# Patient Record
Sex: Male | Born: 1965 | Race: Black or African American | Hispanic: No | Marital: Single | Smoking: Never smoker
Health system: Southern US, Community
[De-identification: ages and names within clinical notes are randomized; demographics above are authoritative.]

## PROBLEM LIST (undated history)

## (undated) ENCOUNTER — Emergency Department (HOSPITAL_COMMUNITY): Discharge status: Absent without leave | Payer: 59

## (undated) DIAGNOSIS — E785 Hyperlipidemia, unspecified: Secondary | ICD-10-CM

## (undated) DIAGNOSIS — I10 Essential (primary) hypertension: Secondary | ICD-10-CM

## (undated) DIAGNOSIS — E119 Type 2 diabetes mellitus without complications: Secondary | ICD-10-CM

## (undated) DIAGNOSIS — G4733 Obstructive sleep apnea (adult) (pediatric): Secondary | ICD-10-CM

## (undated) DIAGNOSIS — G4700 Insomnia, unspecified: Secondary | ICD-10-CM

## (undated) DIAGNOSIS — K219 Gastro-esophageal reflux disease without esophagitis: Secondary | ICD-10-CM

## (undated) HISTORY — DX: Gastro-esophageal reflux disease without esophagitis: K21.9

## (undated) HISTORY — DX: Hyperlipidemia, unspecified: E78.5

## (undated) HISTORY — DX: Essential (primary) hypertension: I10

---

## 2008-12-01 ENCOUNTER — Emergency Department (HOSPITAL_COMMUNITY): Admission: EM | Admit: 2008-12-01 | Discharge: 2008-12-01 | Payer: Self-pay | Admitting: Emergency Medicine

## 2009-06-27 ENCOUNTER — Encounter (INDEPENDENT_AMBULATORY_CARE_PROVIDER_SITE_OTHER): Payer: Self-pay | Admitting: Family Medicine

## 2009-06-27 ENCOUNTER — Ambulatory Visit: Payer: Self-pay | Admitting: Internal Medicine

## 2009-06-27 LAB — CONVERTED CEMR LAB
Alkaline Phosphatase: 61 units/L (ref 39–117)
BUN: 8 mg/dL (ref 6–23)
Basophils Absolute: 0 10*3/uL (ref 0.0–0.1)
Basophils Relative: 0 % (ref 0–1)
Glucose, Bld: 173 mg/dL — ABNORMAL HIGH (ref 70–99)
HDL: 46 mg/dL (ref >39)
Hemoglobin: 13.3 g/dL (ref 13.0–17.0)
LDL Cholesterol: 100 mg/dL — ABNORMAL HIGH (ref 0–99)
MCHC: 32.8 g/dL (ref 30.0–36.0)
Monocytes Absolute: 0.4 10*3/uL (ref 0.1–1.0)
Neutro Abs: 3.6 10*3/uL (ref 1.7–7.7)
RDW: 12.1 % (ref 11.5–15.5)
Sodium: 141 meq/L (ref 135–145)
Total Bilirubin: 0.7 mg/dL (ref 0.3–1.2)
Total CHOL/HDL Ratio: 3.9
Total Protein: 7.3 g/dL (ref 6.0–8.3)
Triglycerides: 161 mg/dL — ABNORMAL HIGH (ref <150)
VLDL: 32 mg/dL (ref 0–40)

## 2009-07-03 ENCOUNTER — Ambulatory Visit: Payer: Self-pay | Admitting: Internal Medicine

## 2009-08-12 ENCOUNTER — Encounter (INDEPENDENT_AMBULATORY_CARE_PROVIDER_SITE_OTHER): Payer: Self-pay | Admitting: Family Medicine

## 2009-08-12 ENCOUNTER — Ambulatory Visit: Payer: Self-pay | Admitting: Internal Medicine

## 2009-08-12 LAB — CONVERTED CEMR LAB
BUN: 11 mg/dL (ref 6–23)
Calcium: 9.7 mg/dL (ref 8.4–10.5)
Creatinine, Ser: 0.91 mg/dL (ref 0.40–1.50)

## 2010-04-01 ENCOUNTER — Ambulatory Visit: Payer: Self-pay | Admitting: Internal Medicine

## 2010-04-01 DIAGNOSIS — K219 Gastro-esophageal reflux disease without esophagitis: Secondary | ICD-10-CM | POA: Insufficient documentation

## 2010-04-01 DIAGNOSIS — E785 Hyperlipidemia, unspecified: Secondary | ICD-10-CM

## 2010-04-01 DIAGNOSIS — I10 Essential (primary) hypertension: Secondary | ICD-10-CM | POA: Insufficient documentation

## 2010-04-01 DIAGNOSIS — E119 Type 2 diabetes mellitus without complications: Secondary | ICD-10-CM

## 2010-06-03 NOTE — Assessment & Plan Note (Signed)
Summary: new to estab/cbs   Vital Signs:  Patient profile:   45 year old male Height:      71 inches Weight:      279 pounds BMI:     39.05 Pulse rate:   110 / minute Pulse rhythm:   regular BP sitting:   138 / 96  (left arm) Cuff size:   regular  Vitals Entered By: Army Fossa CMA (Apr 30, 2010 2:45 PM) CC: New to establish, CPX- not fasting  Comments declines TD and Flu shot  [facility] [address]   History of Present Illness: CPX, new patient, here to establish needs RF  Preventive Screening-Counseling & Management  Alcohol-Tobacco     Smoking Status: never  Caffeine-Diet-Exercise     Does Patient Exercise: no      Drug Use:  no.    Current Medications (verified): 1)  Hydrochlorothiazide 25 Mg Tabs (Hydrochlorothiazide) .Marland Kitchen.. 1 By Mouth Once Daily 2)  Pravastatin Sodium 20 Mg Tabs (Pravastatin Sodium) .Marland Kitchen.. 1 By Mouth At Bedtime. 3)  Metformin Hcl 1000 Mg Tabs (Metformin Hcl) .Marland Kitchen.. 1 By Mouth Two Times A Day. 4)  Metoprolol Succinate 25 Mg Xr24h-Tab (Metoprolol Succinate) .Marland Kitchen.. 1 By Mouth At Bedtime 5)  Viagra 50 Mg Tabs (Sildenafil Citrate) .... As Needed  Allergies (verified): No Known Drug Allergies  Past History:  Family History: Last updated: 04-30-10 Heart Disease-- F , died at age 73  Stroke -- F  Diabetes-- F  Hypertension -- F colon ca--no prostate ca--no  Social History: Last updated: April 30, 2010 divorced 2 children  Never Smoked Alcohol use-yes, socially  Drug use-no Regular exercise- rarely, walks the dog Occupation: Location manager   Risk Factors: Exercise: no (04/30/10)  Risk Factors: Smoking Status: never (04-30-10)  Past Medical History: DM  ~ 2005 mild GERD , uses OTCs Hyperlipidemia Hypertension  Past Surgical History: no major  Family History: Heart Disease-- F , died at age 26  Stroke -- F  Diabetes-- F  Hypertension -- F colon ca--no prostate ca--no  Social History: divorced 2 children    Never Smoked Alcohol use-yes, socially  Drug use-no Regular exercise- rarely, walks the dog Occupation: Location manager  Smoking Status:  never Drug Use:  no Does Patient Exercise:  no Occupation:  employed  Review of Systems CV:  Denies chest pain or discomfort, palpitations, and swelling of feet; no ambulatory BPs  . Resp:  Denies cough and shortness of breath. GI:  Denies bloody stools, nausea, and vomiting. Endo:  no ambulatory CBGs .  Physical Exam  General:  alert, well-developed, and overweight-appearing.   Nose:  slightly congested Lungs:  normal respiratory effort, no intercostal retractions, no accessory muscle use, and normal breath sounds.   Heart:  normal rate, regular rhythm, no murmur, and no gallop.   Abdomen:  soft, non-tender, no distention, no masses, no guarding, and no rigidity.   Extremities:  no pretibial edema bilaterally  Psych:  Oriented X3, memory intact for recent and remote, normally interactive, good eye contact, not anxious appearing, and not depressed appearing.     Impression & Recommendations:  Problem # 1:  ROUTINE GENERAL MEDICAL EXAM@HEALTH  CARE FACL (ICD-V70.0) tetanus shot today Reluctant to take a flu shot. He has a mild  cold. I recommend him to come back in 2 weeks once he is better  extensive discussion about diet and exercise We'll start colon and  prostate cancer screening next year  Problem # 2:  HYPERTENSION (ICD-401.9) RF at goal?  see instructions  His updated medication list for this problem includes:    Hydrochlorothiazide 25 Mg Tabs (Hydrochlorothiazide) .Marland Kitchen... 1 by mouth once daily    Metoprolol Succinate 25 Mg Xr24h-tab (Metoprolol succinate) .Marland Kitchen... 1 by mouth at bedtime  BP today: 138/96  Labs Reviewed: K+: 4.5 (08/12/2009) Creat: : 0.91 (08/12/2009)   Chol: 178 (06/27/2009)   HDL: 46 (06/27/2009)   LDL: 100 (06/27/2009)   TG: 161 (06/27/2009)  Problem # 3:  HYPERLIPIDEMIA (ICD-272.4) labs, RF His  updated medication list for this problem includes:    Pravastatin Sodium 20 Mg Tabs (Pravastatin sodium) .Marland Kitchen... 1 by mouth at bedtime.  Labs Reviewed: SGOT: 16 (06/27/2009)   SGPT: 31 (06/27/2009)   HDL:46 (06/27/2009)  LDL:100 (06/27/2009)  Chol:178 (06/27/2009)  Trig:161 (06/27/2009)  Problem # 4:  DIABETES MELLITUS, TYPE II (ICD-250.00) labs, RF peripheral blood pressures, he has never talked to one. Written material provided diet, diabetes and exercise His updated medication list for this problem includes:    Metformin Hcl 1000 Mg Tabs (Metformin hcl) .Marland Kitchen... 1 by mouth two times a day.  Labs Reviewed: Creat: 0.91 (08/12/2009)     Complete Medication List: 1)  Hydrochlorothiazide 25 Mg Tabs (Hydrochlorothiazide) .Marland Kitchen.. 1 by mouth once daily 2)  Pravastatin Sodium 20 Mg Tabs (Pravastatin sodium) .Marland Kitchen.. 1 by mouth at bedtime. 3)  Metformin Hcl 1000 Mg Tabs (Metformin hcl) .Marland Kitchen.. 1 by mouth two times a day. 4)  Metoprolol Succinate 25 Mg Xr24h-tab (Metoprolol succinate) .Marland Kitchen.. 1 by mouth at bedtime 5)  Viagra 50 Mg Tabs (Sildenafil citrate) .... As needed, once a day  Other Orders: Tdap => 40yrs IM (19147) Admin 1st Vaccine (82956)  Patient Instructions: 1)  please get the records form your previous doctor  2)  come back in 2 weeks for a flu shot 3)  Check your blood pressure 2 or 3 times a week. If it is more than 140/85 consistently,please let us know  4)  Please come back fasting  within one week for blood work: 5)  FLP, AST, ALT--- dx  high cholesterol 6)  Hemoglobin A1c, microalbumin---- dx  diabetes 7)  CBC, BMP, TSH---dx v70 8)  Please schedule a follow-up appointment in 3 months .  Prescriptions: VIAGRA 50 MG TABS (SILDENAFIL CITRATE) as needed, once a day  #10 x 3   Entered and Authorized by:   Elita Quick E. Paz MD   Signed by:   Nolon Rod. Paz MD on 04/01/2010   Method used:   Print then Give to Patient   RxID:   [id number] METOPROLOL SUCCINATE 25 MG XR24H-TAB (METOPROLOL  SUCCINATE) 1 by mouth at bedtime  #90 x 1   Entered and Authorized by:   Elita Quick E. Paz MD   Signed by:   Nolon Rod. Paz MD on 04/01/2010   Method used:   Print then Give to Patient   RxID:   [id number] METFORMIN HCL 1000 MG TABS (METFORMIN HCL) 1 by mouth two times a day.  #180 x 1   Entered and Authorized by:   Nolon Rod. Paz MD   Signed by:   Nolon Rod. Paz MD on 04/01/2010   Method used:   Print then Give to Patient   RxID:   [id number] PRAVASTATIN SODIUM 20 MG TABS (PRAVASTATIN SODIUM) 1 by mouth at bedtime.  #90 x 1   Entered and Authorized by:   Nolon Rod. Paz MD   Signed by:   Nolon Rod. Paz MD on 04/01/2010   Method used:  Print then Give to Patient   RxID:   [id number] HYDROCHLOROTHIAZIDE 25 MG TABS (HYDROCHLOROTHIAZIDE) 1 by mouth once daily  #90 x 1   Entered and Authorized by:   Elita Quick E. Paz MD   Signed by:   Nolon Rod. Paz MD on 04/01/2010   Method used:   Print then Give to Patient   RxID:   [id number]    Orders Added: 1)  Tdap => 26yrs IM [90715] 2)  Admin 1st Vaccine [90471] 3)  New Patient 40-64 years [99386]   Immunizations Administered:  Tetanus Vaccine:    Vaccine Type: Tdap    Site: right deltoid    Mfr: GlaxoSmithKline    Dose: 0.5 ml    Route: IM    Given by: Army Fossa CMA    Exp. Date: 02/21/2012    Lot #: [id number]   Immunizations Administered:  Tetanus Vaccine:    Vaccine Type: Tdap    Site: right deltoid    Mfr: GlaxoSmithKline    Dose: 0.5 ml    Route: IM    Given by: Army Fossa CMA    Exp. Date: 02/21/2012    Lot #: [id number]   Risk Factors:  Tobacco use:  never Drug use:  no Alcohol use:  yes Exercise:  no

## 2010-07-02 ENCOUNTER — Ambulatory Visit: Payer: Self-pay | Admitting: Internal Medicine

## 2010-07-02 ENCOUNTER — Encounter (INDEPENDENT_AMBULATORY_CARE_PROVIDER_SITE_OTHER): Payer: Self-pay | Admitting: *Deleted

## 2010-07-02 ENCOUNTER — Other Ambulatory Visit (INDEPENDENT_AMBULATORY_CARE_PROVIDER_SITE_OTHER): Payer: 59

## 2010-07-02 ENCOUNTER — Other Ambulatory Visit: Payer: Self-pay | Admitting: Internal Medicine

## 2010-07-02 DIAGNOSIS — E119 Type 2 diabetes mellitus without complications: Secondary | ICD-10-CM

## 2010-07-02 DIAGNOSIS — E785 Hyperlipidemia, unspecified: Secondary | ICD-10-CM

## 2010-07-02 LAB — BASIC METABOLIC PANEL
CO2: 27 mEq/L (ref 19–32)
GFR: 122.35 mL/min (ref >60.00)
Glucose, Bld: 173 mg/dL — ABNORMAL HIGH (ref 70–99)
Potassium: 4 mEq/L (ref 3.5–5.1)
Sodium: 138 mEq/L (ref 135–145)

## 2010-07-02 LAB — MICROALBUMIN / CREATININE URINE RATIO
Microalb Creat Ratio: 1.7 mg/g (ref 0.0–30.0)
Microalb, Ur: 5.6 mg/dL — ABNORMAL HIGH (ref 0.0–1.9)

## 2010-07-02 LAB — CBC WITH DIFFERENTIAL/PLATELET
Basophils Absolute: 0 10*3/uL (ref 0.0–0.1)
Eosinophils Relative: 3.2 % (ref 0.0–5.0)
Monocytes Absolute: 0.5 10*3/uL (ref 0.1–1.0)
Monocytes Relative: 8.1 % (ref 3.0–12.0)
Neutrophils Relative %: 53.3 % (ref 43.0–77.0)
Platelets: 208 10*3/uL (ref 150.0–400.0)
RDW: 12.2 % (ref 11.5–14.6)
WBC: 6.1 10*3/uL (ref 4.5–10.5)

## 2010-07-02 LAB — LIPID PANEL
Cholesterol: 124 mg/dL (ref 0–200)
Triglycerides: 148 mg/dL (ref 0.0–149.0)

## 2010-07-02 LAB — HEMOGLOBIN A1C: Hgb A1c MFr Bld: 7.5 % — ABNORMAL HIGH (ref 4.6–6.5)

## 2010-11-10 ENCOUNTER — Telehealth: Payer: Self-pay | Admitting: Internal Medicine

## 2010-11-10 MED ORDER — GLUCOSE BLOOD VI STRP: ORAL_STRIP | Status: AC

## 2010-11-10 NOTE — Telephone Encounter (Signed)
Faxed over strips.  Pt is due for a follow up appt.

## 2010-11-13 NOTE — Telephone Encounter (Signed)
Patient has followup appt for 7/24 at 11:15 for Dr Drue Novel

## 2010-11-20 ENCOUNTER — Encounter: Payer: Self-pay | Admitting: Internal Medicine

## 2010-11-25 ENCOUNTER — Ambulatory Visit (INDEPENDENT_AMBULATORY_CARE_PROVIDER_SITE_OTHER): Payer: 59 | Admitting: Internal Medicine

## 2010-11-25 DIAGNOSIS — E119 Type 2 diabetes mellitus without complications: Secondary | ICD-10-CM

## 2010-11-25 DIAGNOSIS — G4700 Insomnia, unspecified: Secondary | ICD-10-CM | POA: Insufficient documentation

## 2010-11-25 LAB — MICROALBUMIN / CREATININE URINE RATIO: Microalb Creat Ratio: 0.7 mg/g (ref 0.0–30.0)

## 2010-11-25 MED ORDER — ZOLPIDEM TARTRATE 10 MG PO TABS: 10.0000 mg | ORAL_TABLET | Freq: Every evening | ORAL | Status: DC | PRN

## 2010-11-25 NOTE — Assessment & Plan Note (Signed)
Long history of insomnia, no anxiety-depression. Trial with Ambien

## 2010-11-25 NOTE — Assessment & Plan Note (Addendum)
Discussed importance to follow a healthy diet and exercise; also discussed the meaning of hemoglobin A1c. He has not been able to get januvia x 1 month plan: Refill Januvia, labs, if not at goal may be due to the lack of compliance with Januvia. Reasses in 4 months

## 2010-11-25 NOTE — Progress Notes (Signed)
  Subjective:    Patient ID: Bruce Henson, male    DOB: December 01, 1965, 45 y.o.   MRN: [id number]  HPI Routine office visit, feels well  Past Medical History  Diagnosis Date  . Diabetes mellitus 2005  . GERD (gastroesophageal reflux disease)     mild, uses OTCS  . Hyperlipidemia   . Hypertension    No past surgical history on file.   Review of Systems Tried Januvia, tolerated well except for maybe some diarrhea. He stopped taking Januvia for the last month because he couldn't afford it, he now has a new insurance and he will be able to get it. Denies nausea vomiting or blood in the stools Diet has not been the best lately, she's not exercising much Complaint of difficulty sleeping for long time, denies any anxiety depression. Has not ry any medication to help him sleep.    Objective:   Physical Exam  Constitutional: He is oriented to person, place, and time. He appears well-developed.       Obese appearing  Cardiovascular: Normal rate, regular rhythm and normal heart sounds.   No murmur heard. Pulmonary/Chest: Effort normal and breath sounds normal. No respiratory distress. He has no wheezes. He has no rales.  Musculoskeletal: He exhibits no edema.  Neurological: He is alert and oriented to person, place, and time.  Psychiatric: He has a normal mood and affect. His behavior is normal. Judgment and thought content normal.          Assessment & Plan:

## 2011-02-10 ENCOUNTER — Other Ambulatory Visit: Payer: Self-pay | Admitting: Internal Medicine

## 2011-02-10 NOTE — Telephone Encounter (Signed)
Rx[s] Done. 

## 2011-02-16 ENCOUNTER — Ambulatory Visit: Payer: 59 | Admitting: Internal Medicine

## 2011-04-13 ENCOUNTER — Other Ambulatory Visit: Payer: Self-pay | Admitting: Internal Medicine

## 2011-04-13 NOTE — Telephone Encounter (Signed)
Last filled #10 3, last ov 11-25-10

## 2011-05-01 NOTE — Telephone Encounter (Signed)
Ok #5 no further RF w/o OV

## 2011-06-04 ENCOUNTER — Other Ambulatory Visit: Payer: Self-pay | Admitting: Internal Medicine

## 2011-06-05 NOTE — Telephone Encounter (Signed)
Refill done.  

## 2011-06-12 ENCOUNTER — Other Ambulatory Visit: Payer: Self-pay | Admitting: Internal Medicine

## 2011-06-15 NOTE — Telephone Encounter (Signed)
Refill done.  

## 2011-06-19 ENCOUNTER — Other Ambulatory Visit: Payer: Self-pay | Admitting: Internal Medicine

## 2011-06-21 NOTE — Telephone Encounter (Signed)
Ok , #15 only, no RF. He is overdue for a OV

## 2011-06-22 ENCOUNTER — Other Ambulatory Visit: Payer: Self-pay | Admitting: Internal Medicine

## 2011-06-22 NOTE — Telephone Encounter (Signed)
Refill was sent in on 2.15.13. Notified pt.

## 2011-06-22 NOTE — Telephone Encounter (Signed)
Refill done.  

## 2011-06-26 ENCOUNTER — Ambulatory Visit (INDEPENDENT_AMBULATORY_CARE_PROVIDER_SITE_OTHER): Payer: Managed Care, Other (non HMO) | Admitting: Internal Medicine

## 2011-06-26 VITALS — BP 138/88 | HR 83 | Temp 98.3°F | Wt 283.0 lb

## 2011-06-26 DIAGNOSIS — E119 Type 2 diabetes mellitus without complications: Secondary | ICD-10-CM

## 2011-06-26 DIAGNOSIS — J069 Acute upper respiratory infection, unspecified: Secondary | ICD-10-CM | POA: Insufficient documentation

## 2011-06-26 MED ORDER — AMOXICILLIN 500 MG PO CAPS: 1000.0000 mg | ORAL_CAPSULE | Freq: Two times a day (BID) | ORAL | Status: AC

## 2011-06-26 NOTE — Assessment & Plan Note (Signed)
URI w/ nose bleed, see instructions

## 2011-06-26 NOTE — Assessment & Plan Note (Signed)
Labs , needs ROV

## 2011-06-26 NOTE — Patient Instructions (Signed)
Rest, fluids , tylenol For cough, take Mucinex DM twice a day as needed  For nose bleed and congestion: saline nasal spray, vaselin twice a day If not better in 4 days, then start the antibiotic as prescribed ----> Amoxicillin Call if no better in few days Call anytime if the symptoms are severe Please schedule a routine visit in 2 months

## 2011-06-26 NOTE — Progress Notes (Signed)
  Subjective:    Patient ID: Bruce Henson, male    DOB: 17-Oct-1965, 46 y.o.   MRN: [id number]  HPI Acute visit  Symptoms started a week ago with runny nose, sneezing, nasal discharge mostly clear but mixed with some blood. As far as his chronic medical problems, he is taking his medications as prescribed, not ambulatory CBGs  Past medical history Diabetes Hyperlipidemia Hypertension GERD, mild, uses over-the-counter medicines  Past surgical history No  Review of Systems Some subjective fever and chills No focal No chest congestion, very little cough.    Objective:   Physical Exam Alert, oriented x3. No apparent distress HEENT: Tympanic membranes normal, throat without redness, and also slightly congested. The right nostril, medial aspect, there is some evidence of mucosal bleeding. No active bleeding at this point. Lungs clear to auscultation bilaterally Cardiovascular regular rhythm without a murmur.       Assessment & Plan:

## 2011-06-28 ENCOUNTER — Encounter: Payer: Self-pay | Admitting: Internal Medicine

## 2011-06-29 ENCOUNTER — Ambulatory Visit: Payer: 59 | Admitting: Internal Medicine

## 2011-07-03 ENCOUNTER — Telehealth: Payer: Self-pay | Admitting: Internal Medicine

## 2011-07-03 MED ORDER — SITAGLIPTIN PHOSPHATE 100 MG PO TABS: 100.0000 mg | ORAL_TABLET | Freq: Every day | ORAL | Status: DC

## 2011-07-03 MED ORDER — METOPROLOL SUCCINATE ER 25 MG PO TB24: 25.0000 mg | ORAL_TABLET | Freq: Every day | ORAL | Status: DC

## 2011-07-03 MED ORDER — HYDROCHLOROTHIAZIDE 25 MG PO TABS: 25.0000 mg | ORAL_TABLET | Freq: Every day | ORAL | Status: DC

## 2011-07-03 MED ORDER — PRAVASTATIN SODIUM 20 MG PO TABS: 20.0000 mg | ORAL_TABLET | Freq: Every day | ORAL | Status: DC

## 2011-07-03 MED ORDER — METFORMIN HCL 1000 MG PO TABS: 1000.0000 mg | ORAL_TABLET | Freq: Two times a day (BID) | ORAL | Status: DC

## 2011-07-03 NOTE — Telephone Encounter (Signed)
Refill: Metformin hcl tabs 1000mg . Request 90 day supply.

## 2011-07-03 NOTE — Telephone Encounter (Signed)
Refill: Hydrochlorothiazide tab 25mg . Request 90 day supply.  Pravastatin tabs 20 mg. Request 90 day supply.  Januvia tabs 100mg . Request 90 day supply.  Metoprolol succinate er tabs 25mg . Request 90 day supply

## 2011-07-03 NOTE — Telephone Encounter (Signed)
Refill done.  

## 2011-07-07 ENCOUNTER — Telehealth: Payer: Self-pay | Admitting: *Deleted

## 2011-07-07 MED ORDER — HYDROCHLOROTHIAZIDE 25 MG PO TABS: 25.0000 mg | ORAL_TABLET | Freq: Every day | ORAL | Status: DC

## 2011-07-07 MED ORDER — METFORMIN HCL 1000 MG PO TABS: 1000.0000 mg | ORAL_TABLET | Freq: Two times a day (BID) | ORAL | Status: DC

## 2011-07-07 MED ORDER — PRAVASTATIN SODIUM 20 MG PO TABS: 20.0000 mg | ORAL_TABLET | Freq: Every day | ORAL | Status: DC

## 2011-07-07 MED ORDER — METOPROLOL SUCCINATE ER 25 MG PO TB24: 25.0000 mg | ORAL_TABLET | Freq: Every day | ORAL | Status: DC

## 2011-07-07 MED ORDER — SITAGLIPTIN PHOSPHATE 100 MG PO TABS: 100.0000 mg | ORAL_TABLET | Freq: Every day | ORAL | Status: DC

## 2011-07-07 NOTE — Telephone Encounter (Signed)
Pt states that [facility] has been trying to get in touch with [place] in reference to his Rx. Pt notes that they faxed [place] on 06-29-11 and have yet to get a response back from [place]. See telephone encounter 07-03-11 Rx sent to wrong pharmacy Rx resent to [facility].

## 2011-07-13 ENCOUNTER — Telehealth: Payer: Self-pay | Admitting: Internal Medicine

## 2011-07-13 MED ORDER — METFORMIN HCL 1000 MG PO TABS: 1000.0000 mg | ORAL_TABLET | Freq: Two times a day (BID) | ORAL | Status: DC

## 2011-07-13 NOTE — Telephone Encounter (Signed)
Spoke to pharmacy who indicated that they have received all other meds just need Rx for metformin clarify due to quantity. Rx called in to pharmacy.

## 2011-07-13 NOTE — Telephone Encounter (Signed)
Patients wife states that [facility] is not getting our faxes. Please call with ref# [id number] at [phone]   Can call patient back [phone] or Dondra Spry @ [phone]

## 2011-07-20 ENCOUNTER — Telehealth: Payer: Self-pay | Admitting: Internal Medicine

## 2011-07-20 MED ORDER — SILDENAFIL CITRATE 50 MG PO TABS: 50.0000 mg | ORAL_TABLET | ORAL | Status: DC | PRN

## 2011-07-20 NOTE — Telephone Encounter (Signed)
Patient would like prescription for Viagra sent to   [facility]  Phone# [phone]  [facility]ally [facility] is going to permanently switch to [facility] [phone]

## 2011-07-20 NOTE — Telephone Encounter (Signed)
Refill done.  

## 2011-08-28 ENCOUNTER — Other Ambulatory Visit: Payer: Self-pay | Admitting: *Deleted

## 2011-08-28 NOTE — Telephone Encounter (Signed)
Last OV 06-26-11, Last filled 07-20-11 #5

## 2011-09-04 NOTE — Telephone Encounter (Signed)
Denied needs OV 

## 2011-09-07 NOTE — Telephone Encounter (Signed)
Can you please call pt to schedule an OV.

## 2011-09-07 NOTE — Telephone Encounter (Signed)
Called patient left message to call office & schedule appt

## 2011-09-07 NOTE — Telephone Encounter (Signed)
Called patient Bruce Henson to call back ans schedule OV for medication renewal

## 2011-10-08 NOTE — Telephone Encounter (Signed)
Denied needs OV 

## 2012-01-15 ENCOUNTER — Encounter: Payer: Self-pay | Admitting: Internal Medicine

## 2012-01-15 ENCOUNTER — Ambulatory Visit (INDEPENDENT_AMBULATORY_CARE_PROVIDER_SITE_OTHER): Payer: Managed Care, Other (non HMO) | Admitting: Internal Medicine

## 2012-01-15 VITALS — BP 112/76 | HR 80 | Temp 98.6°F | Ht 73.0 in | Wt 279.0 lb

## 2012-01-15 DIAGNOSIS — E785 Hyperlipidemia, unspecified: Secondary | ICD-10-CM

## 2012-01-15 DIAGNOSIS — K219 Gastro-esophageal reflux disease without esophagitis: Secondary | ICD-10-CM

## 2012-01-15 DIAGNOSIS — I10 Essential (primary) hypertension: Secondary | ICD-10-CM

## 2012-01-15 DIAGNOSIS — Z0000 Encounter for general adult medical examination without abnormal findings: Secondary | ICD-10-CM

## 2012-01-15 DIAGNOSIS — E119 Type 2 diabetes mellitus without complications: Secondary | ICD-10-CM

## 2012-01-15 DIAGNOSIS — G4700 Insomnia, unspecified: Secondary | ICD-10-CM

## 2012-01-15 LAB — CBC WITH DIFFERENTIAL/PLATELET
Eosinophils Absolute: 0.2 10*3/uL (ref 0.0–0.7)
Eosinophils Relative: 3 % (ref 0–5)
HCT: 40.8 % (ref 39.0–52.0)
Hemoglobin: 13.8 g/dL (ref 13.0–17.0)
Lymphocytes Relative: 32 % (ref 12–46)
Lymphs Abs: 2.3 10*3/uL (ref 0.7–4.0)
MCH: 28.2 pg (ref 26.0–34.0)
MCV: 83.4 fL (ref 78.0–100.0)
Monocytes Relative: 7 % (ref 3–12)
Platelets: 237 10*3/uL (ref 150–400)
RBC: 4.89 MIL/uL (ref 4.22–5.81)
WBC: 7.2 10*3/uL (ref 4.0–10.5)

## 2012-01-15 MED ORDER — SILDENAFIL CITRATE 50 MG PO TABS: 50.0000 mg | ORAL_TABLET | ORAL | Status: DC | PRN

## 2012-01-15 MED ORDER — ZOLPIDEM TARTRATE 10 MG PO TABS: 10.0000 mg | ORAL_TABLET | Freq: Every evening | ORAL | Status: DC | PRN

## 2012-01-15 MED ORDER — RANITIDINE HCL 300 MG PO TABS: 300.0000 mg | ORAL_TABLET | Freq: Every day | ORAL | Status: DC

## 2012-01-15 NOTE — Assessment & Plan Note (Signed)
BP well-controlled today, labs

## 2012-01-15 NOTE — Progress Notes (Signed)
  Subjective:    Patient ID: Bruce Henson, male    DOB: [birth year], 46 y.o.   MRN: [id number]  HPI CPX  Past medical history Diabetes Hyperlipidemia Hypertension GERD, mild, uses over-the-counter medicines  Past surgical history No  Family History: Heart Disease-- F , died at age 62  Stroke -- F  Diabetes-- F  Hypertension -- F colon ca--no prostate ca--no  Social History: Divorced, remarried, 2 children  Never Smoked Alcohol use-- yes, socially  Drug use-no exercise- none, inactive  Diet-- poor  Occupation: Barista   Review of Systems  Respiratory: Negative for cough and shortness of breath.   Cardiovascular: Negative for chest pain and leg swelling.  Gastrointestinal: Negative for abdominal pain and blood in stool.       Occ heartburn (classic heartburn, no CP) at night   Genitourinary: Negative for dysuria and hematuria.  Psychiatric/Behavioral:       No depression or anxiety    Current Outpatient Rx  Name Route Sig Dispense Refill  . HYDROCHLOROTHIAZIDE 25 MG PO TABS Oral Take 1 tablet (25 mg total) by mouth daily. 90 tablet 3  . METFORMIN HCL 1000 MG PO TABS Oral Take 1 tablet (1,000 mg total) by mouth 2 (two) times daily with a meal. 180 tablet 3  . METOPROLOL SUCCINATE ER 25 MG PO TB24 Oral Take 1 tablet (25 mg total) by mouth daily. 90 tablet 3  . PRAVASTATIN SODIUM 20 MG PO TABS Oral Take 1 tablet (20 mg total) by mouth at bedtime. 90 tablet 3  . SITAGLIPTIN PHOSPHATE 100 MG PO TABS Oral Take 1 tablet (100 mg total) by mouth daily. 90 tablet 3  . ZOLPIDEM TARTRATE 10 MG PO TABS Oral Take 1 tablet (10 mg total) by mouth at bedtime as needed for sleep. 30 tablet 5  . RANITIDINE HCL 300 MG PO TABS Oral Take 1 tablet (300 mg total) by mouth at bedtime. 90 tablet 1  . SILDENAFIL CITRATE 50 MG PO TABS Oral Take 1 tablet (50 mg total) by mouth as needed for erectile dysfunction. 13 tablet 3  . ZOLPIDEM TARTRATE 10 MG PO TABS Oral Take 1 tablet (10  mg total) by mouth at bedtime as needed for sleep. 30 tablet 1        Objective:   Physical Exam  General -- alert, well-developed, and overweight appearing. No apparent distress.  Neck --no thyromegaly, normal carotid pulse Lungs -- normal respiratory effort, no intercostal retractions, no accessory muscle use, and normal breath sounds.   Heart-- normal rate, regular rhythm, no murmur, and no gallop.   Abdomen--soft, non-tender, no distention, no masses, no HSM, no guarding, and no rigidity.   Extremities-- no pretibial edema bilaterally  Neurologic-- alert & oriented X3 and strength normal in all extremities. Psych-- Cognition and judgment appear intact. Alert and cooperative with normal attention span and concentration.  not anxious appearing and not depressed appearing.       Assessment & Plan:

## 2012-01-15 NOTE — Assessment & Plan Note (Signed)
RF meds  

## 2012-01-15 NOTE — Assessment & Plan Note (Signed)
Reports good medication compliance, not ambulatory CBGs. Plan: Labs Strongly encouraged to come back fasting in 4 months for a checkup. I have not see him regularly for diabetes follow up. RF meds

## 2012-01-15 NOTE — Assessment & Plan Note (Signed)
Good medication compliance, he is nonfasting today and cannot come back another time fasting. Plan: Refill medicines, check the AST ALT. FLP on return to the office

## 2012-01-15 NOTE — Assessment & Plan Note (Signed)
Td 2011 I like to start a prostate ca screening q 2 years since he is AA, declined DRE thus will check a PSA  Flu shot -- will get at work discussed diet-exercise Also we managed chronic medical problems today

## 2012-01-15 NOTE — Addendum Note (Signed)
Addended by: Silvio Pate D on: 01/15/2012 06:22 PM   Modules accepted: Orders

## 2012-01-15 NOTE — Addendum Note (Signed)
Addended by: Silvio Pate D on: 01/15/2012 06:15 PM   Modules accepted: Orders

## 2012-01-15 NOTE — Assessment & Plan Note (Signed)
Nocturnal heartburn, information about healthy habits provided, Zantac 300 each bedtime.

## 2012-01-15 NOTE — Addendum Note (Signed)
Addended by: Silvio Pate D on: 01/15/2012 06:20 PM   Modules accepted: Orders

## 2012-01-16 LAB — COMPREHENSIVE METABOLIC PANEL
ALT: 23 U/L (ref 0–53)
AST: 14 U/L (ref 0–37)
Albumin: 4.8 g/dL (ref 3.5–5.2)
BUN: 10 mg/dL (ref 6–23)
CO2: 26 mEq/L (ref 19–32)
Calcium: 9.6 mg/dL (ref 8.4–10.5)
Chloride: 100 mEq/L (ref 96–112)
Potassium: 3.9 mEq/L (ref 3.5–5.3)

## 2012-01-16 LAB — TSH: TSH: 0.586 u[IU]/mL (ref 0.350–4.500)

## 2012-01-18 ENCOUNTER — Encounter: Payer: Self-pay | Admitting: *Deleted

## 2012-02-11 ENCOUNTER — Encounter (HOSPITAL_COMMUNITY): Payer: Self-pay | Admitting: *Deleted

## 2012-02-11 ENCOUNTER — Emergency Department (HOSPITAL_COMMUNITY)
Admission: EM | Admit: 2012-02-11 | Discharge: 2012-02-11 | Disposition: A | Discharge status: Home / Self Care | Payer: Managed Care, Other (non HMO) | Attending: Emergency Medicine | Admitting: Emergency Medicine

## 2012-02-11 DIAGNOSIS — E119 Type 2 diabetes mellitus without complications: Secondary | ICD-10-CM | POA: Insufficient documentation

## 2012-02-11 DIAGNOSIS — Z8042 Family history of malignant neoplasm of prostate: Secondary | ICD-10-CM | POA: Insufficient documentation

## 2012-02-11 DIAGNOSIS — K219 Gastro-esophageal reflux disease without esophagitis: Secondary | ICD-10-CM | POA: Insufficient documentation

## 2012-02-11 DIAGNOSIS — Z833 Family history of diabetes mellitus: Secondary | ICD-10-CM | POA: Insufficient documentation

## 2012-02-11 DIAGNOSIS — Z202 Contact with and (suspected) exposure to infections with a predominantly sexual mode of transmission: Secondary | ICD-10-CM | POA: Insufficient documentation

## 2012-02-11 DIAGNOSIS — I10 Essential (primary) hypertension: Secondary | ICD-10-CM | POA: Insufficient documentation

## 2012-02-11 DIAGNOSIS — Z823 Family history of stroke: Secondary | ICD-10-CM | POA: Insufficient documentation

## 2012-02-11 DIAGNOSIS — Z8249 Family history of ischemic heart disease and other diseases of the circulatory system: Secondary | ICD-10-CM | POA: Insufficient documentation

## 2012-02-11 DIAGNOSIS — Z800 Family history of malignant neoplasm of digestive organs: Secondary | ICD-10-CM | POA: Insufficient documentation

## 2012-02-11 MED ORDER — METRONIDAZOLE 500 MG PO TABS
2000.0000 mg | ORAL_TABLET | Freq: Once | ORAL | Status: AC
  Administered 2012-02-11: 2000 mg via ORAL
  Filled 2012-02-11: qty 4

## 2012-02-11 MED ORDER — AZITHROMYCIN 250 MG PO TABS
1000.0000 mg | ORAL_TABLET | Freq: Once | ORAL | Status: AC
  Administered 2012-02-11: 1000 mg via ORAL
  Filled 2012-02-11: qty 4

## 2012-02-11 MED ORDER — CEFTRIAXONE SODIUM 250 MG IJ SOLR
250.0000 mg | Freq: Once | INTRAMUSCULAR | Status: AC
  Administered 2012-02-11: 250 mg via INTRAMUSCULAR
  Filled 2012-02-11: qty 250

## 2012-02-11 MED ORDER — LIDOCAINE HCL 2 % IJ SOLN
INTRAMUSCULAR | Status: AC
  Filled 2012-02-11: qty 20

## 2012-02-11 NOTE — ED Provider Notes (Signed)
History     CSN: [id number]  Arrival date & time 02/11/12  1846   First MD Initiated Contact with Patient 02/11/12 2133      Chief Complaint  Patient presents with  . Exposure to STD    (Consider location/radiation/quality/duration/timing/severity/associated sxs/prior treatment) Patient is a 46 y.o. male presenting with STD exposure. The history is provided by the patient.  Exposure to STD This is a new problem. The current episode started in the past 7 days. The problem has been unchanged. Pertinent negatives include no abdominal pain, chills, fever, headaches, myalgias, nausea, rash, vomiting or weakness. Nothing aggravates the symptoms. He has tried nothing for the symptoms. The treatment provided no relief.  Pt states his girlfriend told him she tested positive for trichomonas. States no fever, no complaints. No urethral discharge, no pain with urination.   Past Medical History  Diagnosis Date  . Diabetes mellitus 2005  . GERD (gastroesophageal reflux disease)     mild, uses OTCS  . Hyperlipidemia   . Hypertension     History reviewed. No pertinent past surgical history.  Family History  Problem Relation Age of Onset  . Heart disease Father     died at age 29  . Stroke Father   . Diabetes Father   . Hypertension Father   . Colon cancer Neg Hx   . Prostate cancer Neg Hx     History  Substance Use Topics  . Smoking status: Never Smoker   . Smokeless tobacco: Not on file  . Alcohol Use: Yes     socially      Review of Systems  Constitutional: Negative for fever and chills.  Respiratory: Negative.   Cardiovascular: Negative.   Gastrointestinal: Negative for nausea, vomiting and abdominal pain.  Genitourinary: Negative for dysuria, urgency, frequency, hematuria, discharge, penile swelling, scrotal swelling, penile pain and testicular pain.  Musculoskeletal: Negative for myalgias and back pain.  Skin: Negative for rash.  Neurological: Negative for dizziness,  weakness and headaches.    Allergies  Review of patient's allergies indicates no known allergies.  Home Medications   Current Outpatient Rx  Name Route Sig Dispense Refill  . HYDROCHLOROTHIAZIDE 25 MG PO TABS Oral Take 25 mg by mouth daily.    Marland Kitchen METFORMIN HCL 1000 MG PO TABS Oral Take 1,000 mg by mouth 2 (two) times daily with a meal.    . METOPROLOL SUCCINATE ER 25 MG PO TB24 Oral Take 25 mg by mouth daily.    Marland Kitchen PRAVASTATIN SODIUM 20 MG PO TABS Oral Take 20 mg by mouth at bedtime.    Marland Kitchen RANITIDINE HCL 300 MG PO TABS Oral Take 300 mg by mouth at bedtime.    Marland Kitchen SILDENAFIL CITRATE 50 MG PO TABS Oral Take 50 mg by mouth as needed. E.D.    . SITAGLIPTIN PHOSPHATE 100 MG PO TABS Oral Take 100 mg by mouth daily.    Marland Kitchen ZOLPIDEM TARTRATE 10 MG PO TABS Oral Take 10 mg by mouth at bedtime as needed.    Marland Kitchen ZOLPIDEM TARTRATE 10 MG PO TABS Oral Take 1 tablet (10 mg total) by mouth at bedtime as needed for sleep. 30 tablet 1    BP 136/78  Pulse 85  Temp 98.6 F (37 C) (Oral)  Resp 18  SpO2 99%  Physical Exam  Nursing note and vitals reviewed. Constitutional: He appears well-developed and well-nourished. No distress.  Eyes: Conjunctivae normal are normal.  Neck: Neck supple.  Cardiovascular: Normal rate and normal heart sounds.  Pulmonary/Chest: Effort normal and breath sounds normal. No respiratory distress. He has no wheezes. He has no rales.  Abdominal: Soft. Bowel sounds are normal. He exhibits no distension. There is no tenderness.  Genitourinary: Prostate normal and penis normal.    ED Course  Procedures (including critical care time)  Pt with no urinary symptoms, no penile discharge, no scrotal pain. VS normal. Will treat with rocephin 250mg  IM, zithromax 1g PO, flagyl 2gPO. Follow up as needed    1. Exposure to STD       MDM          Lottie Mussel, PA 02/12/12 0125

## 2012-02-11 NOTE — ED Notes (Signed)
Pt states girlfriend diagnosed with trichomonas; pt has no symptoms

## 2012-02-11 NOTE — ED Notes (Signed)
Pt verbalizes understanding 

## 2012-02-13 NOTE — ED Provider Notes (Signed)
Medical screening examination/treatment/procedure(s) were performed by non-physician practitioner and as supervising physician I was immediately available for consultation/collaboration.    Tynan Boesel R Lenwood Balsam, MD 02/13/12 1650 

## 2012-02-26 ENCOUNTER — Ambulatory Visit (INDEPENDENT_AMBULATORY_CARE_PROVIDER_SITE_OTHER): Payer: Managed Care, Other (non HMO) | Admitting: Internal Medicine

## 2012-02-26 ENCOUNTER — Encounter: Payer: Self-pay | Admitting: Internal Medicine

## 2012-02-26 VITALS — BP 138/86 | HR 72 | Temp 98.1°F | Wt 284.0 lb

## 2012-02-26 DIAGNOSIS — Z113 Encounter for screening for infections with a predominantly sexual mode of transmission: Secondary | ICD-10-CM

## 2012-02-26 DIAGNOSIS — I10 Essential (primary) hypertension: Secondary | ICD-10-CM

## 2012-02-26 LAB — POCT URINALYSIS DIPSTICK
Bilirubin, UA: NEGATIVE
Leukocytes, UA: NEGATIVE
Nitrite, UA: NEGATIVE
Protein, UA: NEGATIVE
pH, UA: 6

## 2012-02-26 NOTE — Patient Instructions (Addendum)
Safer Sex  Your caregiver wants you to have this information about the infections that can be transmitted from sexual contact and how to prevent them. The idea behind safer sex is that you can be sexually active, and at the same time reduce the risk of giving or getting a sexually transmitted disease (STD). Every person should be aware of how to prevent him or herself and his or her sex partner from getting an STD.  CAUSES OF STDS  STDs are transmitted by sharing body fluids, which contain viruses and bacteria. The following fluids all transmit infections during sexual intercourse and sex acts:  · Semen.  · Saliva.  · Urine.  · Blood.  · Vaginal mucus.  Examples of STDs include:  · Chlamydia.  · Gonorrhea.  · Genital herpes.  · Hepatitis B.  · Human immunodeficiency virus or acquired immunodeficiency syndrome (HIV or AIDS).  · Syphilis.  · Trichomonas.  · Pubic lice.  · Human papillomavirus (HPV), which may include:  · Genital warts.  · Cervical dysplasia.  · Cervical cancer (can develop with certain types of HPV).  SYMPTOMS   Sexual diseases often cause few or no symptoms until they are advanced, so a person can be infected and spread the infection without knowing it. Some STDs respond to treatment very well. Others, like HIV and herpes, cannot be cured, but are treated to reduce their effects.  Specific symptoms include:  · Abnormal vaginal discharge.  · Irritation or itching in and around the vagina, and in the pubic hair.  · Pain during sexual intercourse.  · Bleeding during sexual intercourse.  · Pelvic or abdominal pain.  · Fever.  · Growths in and around the vagina.  · An ulcer in or around the vagina.  · Swollen glands in the groin area.  DIAGNOSIS   · Blood tests.  · Pap test.  · Culture test of abnormal vaginal discharge.  · A test that applies a solution and examines the cervix with a lighted magnifying scope (colposcopy).  · A test that examines the pelvis with a lighted tube, through a small incision  (laparoscopy).  TREATMENT   The treatment will depend on the cause of the STD.  · Antibiotic treatment by injection, oral, creams, or suppositories in the vagina.  · Over-the-counter medicated shampoo, to get rid of pubic lice.  · Removing or treating growths with medicine, freezing, burning (electrocautery), or surgery.  · Surgery treatment for HPV of the cervix.  · Supportive medicines for herpes, HIV, AIDS, and hepatitis.  Being careful cannot eliminate all risk of infection, but sex can be made much safer.  Safe sexual practices include body massage and gentle touching. Masturbation is safe, as long as body fluids do not contact skin that has sores or cuts. Dry kissing and oral sex on a man wearing a latex condom or on a woman wearing a male condom is also safe. Slightly less safe is intercourse while the man wears a latex condom or wet kissing. It is also safer to have one sex partner that you know is not having sex with anyone else.  LENGTH OF ILLNESS  An STD might be treated and cured in a week, sometimes a month, or more. And it can linger with symptoms for many years. STDs can also cause damage to the male organs. This can cause chronic pain, infertility, and recurrence of the STD, especially herpes, hepatitis, HIV, and HPV.  HOME CARE INSTRUCTIONS AND PREVENTION  · Alcohol   and recreational drugs are often the reason given for not practicing safer sex. These substances affect your judgment. Alcohol and recreational drugs can also impair your immune system, making you more vulnerable to disease.  · Do not engage in risky and dangerous sexual practices, including:  · Vaginal or anal sex without a condom.  · Oral sex on a man without a condom.  · Oral sex on a woman without a male condom.  · Using saliva to lubricate a condom.  · Any other sexual contact in which body fluids or blood from one partner contact the other partner.  · You should use only latex condoms for men and water soluble lubricants.  Petroleum based lubricants or oils used to lubricate a condom will weaken the condom and increase the chance that it will break.  · Think very carefully before having sex with anyone who is high risk for STDs and HIV. This includes IV drug users, people with multiple sexual partners, or people who have had an STD, or a positive hepatitis or HIV blood test.  · Remember that even if your partner has had only one previous partner, their previous partner might have had multiple partners. If so, you are at high risk of being exposed to an STD. You and your sex partner should be the only sex partners with each other, with no one else involved.  · A vaccine is available for hepatitis B and HPV through your caregiver or the Public Health Department. Everyone should be vaccinated with these vaccines.  · Avoid risky sex practices. Sex acts that can break the skin make you more likely to get an STD.  SEEK MEDICAL CARE IF:   · If you think you have an STD, even if you do not have any symptoms. Contact your caregiver for evaluation and treatment, if needed.  · You think or know your sex partner has acquired an STD.  · You have any of the symptoms mentioned above.  Document Released: 05/28/2004 Document Revised: 07/13/2011 Document Reviewed: 03/20/2009  ExitCare® Patient Information ©2013 [employer].

## 2012-02-26 NOTE — Progress Notes (Signed)
  Subjective:    Patient ID: Bruce Henson, male    DOB: 05-24-1965, 46 y.o.   MRN: [id number]  HPI Acute visit Went to the ER 2 weeks ago after exposure to Trichomonas, received Rocephin and Zithromax. He again had intercourse, the condom broke, likes to be sure he's okay.  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     Objective:   Physical Exam  Constitutional: He appears well-developed. No distress.  Genitourinary: Penis normal.       No penile d/c , rash, lesions. No inguinal LADs; scrotal contents normal  Skin: He is not diaphoretic.  Psychiatric: He has a normal mood and affect. His behavior is normal. Judgment and thought content normal.       Assessment & Plan:  STD screening  Safe sex!! Check for G&C, HIV RPR

## 2012-02-26 NOTE — Assessment & Plan Note (Signed)
Well controlled 

## 2012-02-27 LAB — RPR

## 2012-03-01 ENCOUNTER — Telehealth: Payer: Self-pay

## 2012-03-01 ENCOUNTER — Encounter: Payer: Self-pay | Admitting: *Deleted

## 2012-03-01 NOTE — Telephone Encounter (Signed)
Discussed with pt & gave urine results.

## 2012-03-01 NOTE — Telephone Encounter (Signed)
Pt called asking for lab results. I advised pt neg for RPR, HIV Chlamydia. Pt stated understanding and wanted to make sure there wasn't anymore test pending. PLz advise    MW

## 2012-03-11 ENCOUNTER — Ambulatory Visit: Payer: Managed Care, Other (non HMO) | Admitting: Internal Medicine

## 2012-09-27 ENCOUNTER — Other Ambulatory Visit: Payer: Self-pay | Admitting: Internal Medicine

## 2012-09-28 NOTE — Telephone Encounter (Signed)
Refill done.  

## 2012-10-11 ENCOUNTER — Encounter: Payer: Self-pay | Admitting: Lab

## 2012-10-12 ENCOUNTER — Ambulatory Visit (INDEPENDENT_AMBULATORY_CARE_PROVIDER_SITE_OTHER): Payer: Managed Care, Other (non HMO) | Admitting: Internal Medicine

## 2012-10-12 ENCOUNTER — Encounter: Payer: Self-pay | Admitting: Internal Medicine

## 2012-10-12 VITALS — BP 138/90 | HR 96 | Temp 98.5°F | Wt 279.0 lb

## 2012-10-12 DIAGNOSIS — E785 Hyperlipidemia, unspecified: Secondary | ICD-10-CM

## 2012-10-12 DIAGNOSIS — E119 Type 2 diabetes mellitus without complications: Secondary | ICD-10-CM

## 2012-10-12 DIAGNOSIS — Z0000 Encounter for general adult medical examination without abnormal findings: Secondary | ICD-10-CM

## 2012-10-12 DIAGNOSIS — I10 Essential (primary) hypertension: Secondary | ICD-10-CM

## 2012-10-12 DIAGNOSIS — G4700 Insomnia, unspecified: Secondary | ICD-10-CM

## 2012-10-12 LAB — MICROALBUMIN / CREATININE URINE RATIO
Creatinine,U: 306.4 mg/dL
Microalb Creat Ratio: 1.4 mg/g (ref 0.0–30.0)
Microalb, Ur: 4.2 mg/dL — ABNORMAL HIGH (ref 0.0–1.9)

## 2012-10-12 LAB — BASIC METABOLIC PANEL
BUN: 10 mg/dL (ref 6–23)
CO2: 25 mEq/L (ref 19–32)
Chloride: 103 mEq/L (ref 96–112)
Creatinine, Ser: 0.9 mg/dL (ref 0.4–1.5)
Glucose, Bld: 181 mg/dL — ABNORMAL HIGH (ref 70–99)

## 2012-10-12 LAB — LIPID PANEL: Total CHOL/HDL Ratio: 3

## 2012-10-12 LAB — HEMOGLOBIN A1C: Hgb A1c MFr Bld: 7.9 % — ABNORMAL HIGH (ref 4.6–6.5)

## 2012-10-12 MED ORDER — ZOLPIDEM TARTRATE 10 MG PO TABS: 10.0000 mg | ORAL_TABLET | Freq: Every evening | ORAL | Status: DC | PRN

## 2012-10-12 NOTE — Patient Instructions (Addendum)
Next visit in 4 months     Diabetes and Foot Care Diabetes may cause you to have a poor blood supply (circulation) to your legs and feet. Because of this, the skin may be thinner, break easier, and heal more slowly. You also may have nerve damage in your legs and feet causing decreased feeling. You may not notice minor injuries to your feet that could lead to serious problems or infections. Taking care of your feet is one of the most important things you can do for yourself.  HOME CARE INSTRUCTIONS  Do not go barefoot. Bare feet are easily injured.  Check your feet daily for blisters, cuts, and redness.  Wash your feet with warm water (not hot) and mild soap. Pat your feet and between your toes until completely dry.  Apply a moisturizing lotion that does not contain alcohol or petroleum jelly to the dry skin on your feet and to dry brittle toenails. Do not put it between your toes.  Trim your toenails straight across. Do not dig under them or around the cuticle.  Do not cut corns or calluses, or try to remove them with medicine.  Wear clean cotton socks or stockings every day. Make sure they are not too tight. Do not wear knee high stockings since they may decrease blood flow to your legs.  Wear leather shoes that fit properly and have enough cushioning. To break in new shoes, wear them just a few hours a day to avoid injuring your feet.  Wear shoes at all times, even in the house.  Do not cross your legs. This may decrease the blood flow to your feet.  If you find a minor scrape, cut, or break in the skin on your feet, keep it and the skin around it clean and dry. These areas may be cleansed with mild soap and water. Do not use peroxide, alcohol, iodine or Merthiolate.  When you remove an adhesive bandage, be sure not to harm the skin around it.  If you have a wound, look at it several times a day to make sure it is healing.  Do not use heating pads or hot water bottles. Burns can  occur. If you have lost feeling in your feet or legs, you may not know it is happening until it is too late.  Report any cuts, sores or bruises to your caregiver. Do not wait! SEEK MEDICAL CARE IF:   You have an injury that is not healing or you notice redness, numbness, burning, or tingling.  Your feet always feel cold.  You have pain or cramps in your legs and feet. SEEK IMMEDIATE MEDICAL CARE IF:   There is increasing redness, swelling, or increasing pain in the wound.  There is a red line that goes up your leg.  Pus is coming from a wound.  You develop an unexplained oral temperature above 102 F (38.9 C), or as your caregiver suggests.  You notice a bad smell coming from an ulcer or wound. MAKE SURE YOU:   Understand these instructions.  Will watch your condition.  Will get help right away if you are not doing well or get worse. Document Released: 04/17/2000 Document Revised: 07/13/2011 Document Reviewed: 10/24/2008 [facility] Patient Information 2014 [place], [place].

## 2012-10-12 NOTE — Assessment & Plan Note (Signed)
Good med compliance, not fasting today and won't be able to come back for labs: check FLP

## 2012-10-12 NOTE — Progress Notes (Signed)
  Subjective:    Patient ID: Bruce Henson, male    DOB: 1965-08-12, 47 y.o.   MRN: [id number]  HPI ROV Diabetes, good medication compliance, not ambulatory CBGs. Hypertension, good medication compliance, not ambulatory BPs Insomnia, Ambien works well, needs a refill. High cholesterol, taking Pravachol, not fasting today.  Past Medical History  Diagnosis Date  . Diabetes mellitus 2005  . GERD (gastroesophageal reflux disease)     mild, uses OTCS  . Hyperlipidemia   . Hypertension     No past surgical history on file.  History   Social History  . Marital Status: Single    Spouse Name: N/A    Number of Children: 2  . Years of Education: N/A   Occupational History  . Heavy Arboriculturist    Social History Main Topics  . Smoking status: Never Smoker   . Smokeless tobacco: Never Used  . Alcohol Use: Yes     Comment: socially  . Drug Use: No  . Sexually Active: Not on file   Other Topics Concern  . Not on file   Social History Narrative   Divorced, remarried, 2 children                 Review of Systems Denies chest pain, shortness of breath No nausea, vomiting, diarrhea. Occasional blurred vision, has not have his eyes checked lately. Denies burning of his feet, occasionally has numbness of the right great toe.    Objective:   Physical Exam BP 138/90  Pulse 96  Temp(Src) 98.5 F (36.9 C) (Oral)  Wt 279 lb (126.554 kg)  BMI 36.82 kg/m2  SpO2 96%  General -- alert, well-developed Lungs -- normal respiratory effort, no intercostal retractions, no accessory muscle use, and normal breath sounds.   Heart-- normal rate, regular rhythm, no murmur, and no gallop.   DIABETIC FEET EXAM: No lower extremity edema Normal pedal pulses bilaterally Skin and nails are normal without calluses Pinprick examination of the feet normal. Neurologic-- alert & oriented X3 and strength normal in all extremities. Psych-- Cognition and judgment appear intact. Alert and  cooperative with normal attention span and concentration.  not anxious appearing and not depressed appearing.       Assessment & Plan:

## 2012-10-12 NOTE — Assessment & Plan Note (Signed)
DBP slt elevated, recheck on RTC, consider adjust med Labs

## 2012-10-12 NOTE — Assessment & Plan Note (Signed)
Re check a HIV

## 2012-10-12 NOTE — Assessment & Plan Note (Addendum)
No ambulatory CBGs, will check A1c. Feet exam normal today, nevertheless feet care  was discussed. Refer to ophthalmology, has not seen one in a while.

## 2012-10-12 NOTE — Assessment & Plan Note (Signed)
RF meds  

## 2012-10-17 ENCOUNTER — Telehealth: Payer: Self-pay | Admitting: *Deleted

## 2012-10-17 MED ORDER — PIOGLITAZONE HCL 30 MG PO TABS: 30.0000 mg | ORAL_TABLET | Freq: Every day | ORAL | Status: DC

## 2012-10-17 NOTE — Telephone Encounter (Signed)
Discussed with pt, sent rx to pharmacy.  

## 2012-10-17 NOTE — Telephone Encounter (Signed)
Message copied by Nada Maclachlan on Mon Oct 17, 2012  4:16 PM ------      Message from: Willow Ora E      Created: Mon Oct 17, 2012  8:07 AM       Call patient      His diabetes is not well-controlled, other labs okay.      Definitely needs to improve his diet and exercise at least 3 hours weekly.      In addition I recommend Actos 30 mg one tablet daily. #30, 5 RF      Office visit in 3 months.Advise patient I strongly encouraged him to come back, otherwise I cannot help in get to goal. ------

## 2012-11-01 ENCOUNTER — Encounter: Payer: Self-pay | Admitting: Internal Medicine

## 2013-01-18 ENCOUNTER — Telehealth: Payer: Self-pay | Admitting: Internal Medicine

## 2013-01-18 NOTE — Telephone Encounter (Signed)
Spoke w/pt. He was not able to make appt at this time. Will call back to schedule follow-up appt.

## 2013-01-18 NOTE — Telephone Encounter (Signed)
Due for a ROV, lease schedule

## 2013-01-18 NOTE — Telephone Encounter (Signed)
thx

## 2013-02-03 ENCOUNTER — Other Ambulatory Visit: Payer: Self-pay | Admitting: Internal Medicine

## 2013-02-03 NOTE — Telephone Encounter (Signed)
Medication refills for viagra and zantac sent to pharmacy

## 2013-07-06 ENCOUNTER — Telehealth: Payer: Self-pay | Admitting: Internal Medicine

## 2013-07-06 MED ORDER — METFORMIN HCL 1000 MG PO TABS: 1000.0000 mg | ORAL_TABLET | Freq: Two times a day (BID) | ORAL | Status: DC

## 2013-07-06 MED ORDER — HYDROCHLOROTHIAZIDE 25 MG PO TABS: ORAL_TABLET | ORAL | Status: DC

## 2013-07-06 MED ORDER — PRAVASTATIN SODIUM 20 MG PO TABS: ORAL_TABLET | ORAL | Status: DC

## 2013-07-06 MED ORDER — METOPROLOL SUCCINATE ER 25 MG PO TB24: ORAL_TABLET | ORAL | Status: DC

## 2013-07-06 MED ORDER — SITAGLIPTIN PHOSPHATE 100 MG PO TABS: ORAL_TABLET | ORAL | Status: DC

## 2013-07-06 NOTE — Telephone Encounter (Signed)
Done. [facility] along with 2wk supply to [facility]. Pt notified.

## 2013-07-06 NOTE — Telephone Encounter (Signed)
Patient called and stated that his mail order [facility] ([facility] have sent requestes over to refill his Rx's  Metformin Pravastatin Metoprolol Januvia  hydrochlorothiazide (HYDRODIURIL) 25 MG tablet  Also Patient has ran out of these medications and would like for a 14 day supply be called into [facility] on [address] as well [facility].

## 2013-07-10 ENCOUNTER — Encounter: Payer: Self-pay | Admitting: Internal Medicine

## 2013-07-10 ENCOUNTER — Ambulatory Visit (INDEPENDENT_AMBULATORY_CARE_PROVIDER_SITE_OTHER): Payer: Managed Care, Other (non HMO) | Admitting: Internal Medicine

## 2013-07-10 VITALS — BP 134/82 | HR 84 | Temp 98.0°F | Wt 282.0 lb

## 2013-07-10 DIAGNOSIS — E119 Type 2 diabetes mellitus without complications: Secondary | ICD-10-CM

## 2013-07-10 DIAGNOSIS — E785 Hyperlipidemia, unspecified: Secondary | ICD-10-CM

## 2013-07-10 DIAGNOSIS — I10 Essential (primary) hypertension: Secondary | ICD-10-CM

## 2013-07-10 DIAGNOSIS — G4700 Insomnia, unspecified: Secondary | ICD-10-CM

## 2013-07-10 MED ORDER — ZOLPIDEM TARTRATE 10 MG PO TABS: 10.0000 mg | ORAL_TABLET | Freq: Every evening | ORAL | Status: DC | PRN

## 2013-07-10 MED ORDER — HYDROCODONE-ACETAMINOPHEN 5-325 MG PO TABS: 1.0000 | ORAL_TABLET | Freq: Every evening | ORAL | Status: DC | PRN

## 2013-07-10 NOTE — Progress Notes (Signed)
Pre visit review using our clinic review tool, if applicable. No additional management support is needed unless otherwise documented below in the visit note. 

## 2013-07-10 NOTE — Patient Instructions (Signed)
Get your blood work before you leave   Next visit is for a physical exam in3 months  , fasting Please make an appointment     

## 2013-07-10 NOTE — Progress Notes (Signed)
Subjective:    Patient ID: Bruce Henson, male    DOB: 1965-07-03, 48 y.o.   MRN: [id number]  DOS:  07/10/2013 Type of  visit: Followup from previous visit (10-2012) Diabetes--never started Actos, no ambulatory CBGs Hypertension, good compliance with medications, not ambulatory BPs. High cholesterol, good compliance with Pravachol without apparent side effects. Few days ago, one of his tooth fillings broke, he already has an appointment to see the dentist but complained of severe pain. Denies swelling, fever or chills.      ROS Diet-- trying to eat better  Exercise-- active at work, no routine exercise  DM---  (-) LE paresthesias , no visual disturbances  No  CP, SOB No palpitations, no lower extremity edema  Denies  nausea, vomiting diarrhea Denies  blood in the stools No dysuria, gross hematuria, difficulty urinating  No testicular pain, genital rash, penile discharge   Past Medical History  Diagnosis Date  . Diabetes mellitus 2005  . GERD (gastroesophageal reflux disease)     mild, uses OTCS  . Hyperlipidemia   . Hypertension     Past Surgical History  Procedure Laterality Date  . No past surgeries      History   Social History  . Marital Status: Single    Spouse Name: N/A    Number of Children: 2  . Years of Education: N/A   Occupational History  . Heavy Arboriculturist    Social History Main Topics  . Smoking status: Never Smoker   . Smokeless tobacco: Never Used  . Alcohol Use: Yes     Comment: socially  . Drug Use: No  . Sexual Activity: Not on file   Other Topics Concern  . Not on file   Social History Narrative   Divorced, remarried, 2 children                    Medication List       This list is accurate as of: 07/10/13  6:58 PM.  Always use your most recent med list.               hydrochlorothiazide 25 MG tablet  Commonly known as:  HYDRODIURIL  TAKE 1 TABLET DAILY     HYDROcodone-acetaminophen 5-325 MG per tablet    Commonly known as:  NORCO/VICODIN  Take 1-2 tablets by mouth at bedtime as needed.     metFORMIN 1000 MG tablet  Commonly known as:  GLUCOPHAGE  Take 1 tablet (1,000 mg total) by mouth 2 (two) times daily with a meal.     metoprolol succinate 25 MG 24 hr tablet  Commonly known as:  TOPROL-XL  TAKE 1 TABLET DAILY     pravastatin 20 MG tablet  Commonly known as:  PRAVACHOL  TAKE 1 TABLET AT BEDTIME     ranitidine 300 MG tablet  Commonly known as:  ZANTAC  Take 300 mg by mouth at bedtime.     ranitidine 300 MG tablet  Commonly known as:  ZANTAC  TAKE 1 TABLET AT BEDTIME     sitaGLIPtin 100 MG tablet  Commonly known as:  JANUVIA  TAKE 1 TABLET DAILY     VIAGRA 50 MG tablet  Generic drug:  sildenafil  TAKE 1 TABLET AS NEEDED FOR ERECTILE DYSFUNCTION     zolpidem 10 MG tablet  Commonly known as:  AMBIEN  Take 1 tablet (10 mg total) by mouth at bedtime as needed.  Objective:   Physical Exam BP 134/82  Pulse 84  Temp(Src) 98 F (36.7 C)  Wt 282 lb (127.914 kg)  SpO2 99% General -- alert, well-developed, NAD.  HEENT-- Not pale. Neck and face symmetric, gum @ the L lower side w/o swelling Lungs -- normal respiratory effort, no intercostal retractions, no accessory muscle use, and normal breath sounds.  Heart-- normal rate, regular rhythm, no murmur.   Extremities-- no pretibial edema bilaterally  Neurologic--  alert & oriented X3. Speech normal, gait normal, strength normal in all extremities.  Psych-- Cognition and judgment appear intact. Cooperative with normal attention span and concentration. No anxious or depressed appearing.       Assessment & Plan:   Tooth ache  Already has an appointment with the dentist, no evidence of infection. Prescribed hydrocodone for nighttime only, will cause drowsiness. For daytime use of OTCs

## 2013-07-10 NOTE — Assessment & Plan Note (Signed)
Nonfasting, unable to come back tomorrow for labs. Will check LFTs continue Pravachol

## 2013-07-10 NOTE — Assessment & Plan Note (Signed)
No ambulatory BPs but BP today very good. Good compliance of medications. Labs

## 2013-07-10 NOTE — Assessment & Plan Note (Signed)
Symptoms well-controlled with Ambien, refill as needed

## 2013-07-10 NOTE — Assessment & Plan Note (Signed)
Never started Actos. Good compliance with Januvia and metformin. No ambulatory CBGs. Labs, further advice would result

## 2013-07-11 ENCOUNTER — Telehealth: Payer: Self-pay | Admitting: Internal Medicine

## 2013-07-11 LAB — COMPREHENSIVE METABOLIC PANEL
ALBUMIN: 4.4 g/dL (ref 3.5–5.2)
ALT: 39 U/L (ref 0–53)
AST: 25 U/L (ref 0–37)
Alkaline Phosphatase: 55 U/L (ref 39–117)
BILIRUBIN TOTAL: 0.8 mg/dL (ref 0.3–1.2)
BUN: 12 mg/dL (ref 6–23)
CO2: 28 meq/L (ref 19–32)
Calcium: 9.2 mg/dL (ref 8.4–10.5)
Chloride: 102 mEq/L (ref 96–112)
Creatinine, Ser: 0.9 mg/dL (ref 0.4–1.5)
GFR: 122.35 mL/min (ref >60.00)
GLUCOSE: 117 mg/dL — AB (ref 70–99)
POTASSIUM: 3.7 meq/L (ref 3.5–5.1)
SODIUM: 140 meq/L (ref 135–145)
TOTAL PROTEIN: 7.5 g/dL (ref 6.0–8.3)

## 2013-07-11 LAB — MICROALBUMIN / CREATININE URINE RATIO
Creatinine,U: 196.7 mg/dL
MICROALB/CREAT RATIO: 0.8 mg/g (ref 0.0–30.0)
Microalb, Ur: 1.6 mg/dL (ref 0.0–1.9)

## 2013-07-11 LAB — HEMOGLOBIN A1C: HEMOGLOBIN A1C: 7.1 % — AB (ref 4.6–6.5)

## 2013-07-11 NOTE — Telephone Encounter (Signed)
Relevant patient education mailed to patient.  

## 2013-07-13 ENCOUNTER — Telehealth: Payer: Self-pay

## 2013-07-13 MED ORDER — PIOGLITAZONE HCL 30 MG PO TABS: 30.0000 mg | ORAL_TABLET | Freq: Every day | ORAL | Status: DC

## 2013-07-13 NOTE — Addendum Note (Signed)
Addended by: Eustace QuailEABOLD, Jakeem Grape J on: 07/13/2013 04:56 PM   Modules accepted: Orders

## 2013-07-13 NOTE — Telephone Encounter (Signed)
Relevant patient education mailed to patient.  

## 2013-07-30 ENCOUNTER — Encounter (HOSPITAL_COMMUNITY): Payer: Self-pay | Admitting: Emergency Medicine

## 2013-07-30 ENCOUNTER — Emergency Department (HOSPITAL_COMMUNITY)
Admission: EM | Admit: 2013-07-30 | Discharge: 2013-07-30 | Disposition: A | Discharge status: Home / Self Care | Payer: Managed Care, Other (non HMO) | Attending: Emergency Medicine | Admitting: Emergency Medicine

## 2013-07-30 DIAGNOSIS — K0889 Other specified disorders of teeth and supporting structures: Secondary | ICD-10-CM

## 2013-07-30 DIAGNOSIS — I10 Essential (primary) hypertension: Secondary | ICD-10-CM | POA: Insufficient documentation

## 2013-07-30 DIAGNOSIS — E119 Type 2 diabetes mellitus without complications: Secondary | ICD-10-CM | POA: Insufficient documentation

## 2013-07-30 DIAGNOSIS — E785 Hyperlipidemia, unspecified: Secondary | ICD-10-CM | POA: Insufficient documentation

## 2013-07-30 DIAGNOSIS — K089 Disorder of teeth and supporting structures, unspecified: Secondary | ICD-10-CM | POA: Insufficient documentation

## 2013-07-30 DIAGNOSIS — K219 Gastro-esophageal reflux disease without esophagitis: Secondary | ICD-10-CM | POA: Insufficient documentation

## 2013-07-30 DIAGNOSIS — Z79899 Other long term (current) drug therapy: Secondary | ICD-10-CM | POA: Insufficient documentation

## 2013-07-30 MED ORDER — PENICILLIN V POTASSIUM 500 MG PO TABS: 500.0000 mg | ORAL_TABLET | Freq: Three times a day (TID) | ORAL | Status: DC

## 2013-07-30 MED ORDER — OXYCODONE-ACETAMINOPHEN 5-325 MG PO TABS
2.0000 | ORAL_TABLET | Freq: Once | ORAL | Status: AC
  Administered 2013-07-30: 2 via ORAL
  Filled 2013-07-30: qty 2

## 2013-07-30 MED ORDER — OXYCODONE-ACETAMINOPHEN 5-325 MG PO TABS: 1.0000 | ORAL_TABLET | ORAL | Status: DC | PRN

## 2013-07-30 NOTE — ED Notes (Signed)
Pt from home c/o of left lower dental pain that has been going on "for a while " but has gotten worse.

## 2013-07-30 NOTE — Discharge Instructions (Signed)
You have been diagnosed with Dental pain. Please call the follow up dentist first thing in the morning on Monday for a follow up appointment. Keep your discharge paperwork from today's visit to bring to the dentist office. You may also use the resource guide listed below to help you find a dentist if you do not already have one to followup with. It is very important that you get evaluated by a dentist as soon as possible.  Use your pain medication as prescribed and do not operate heavy machinery while on pain medication. Note that your pain medication contains acetaminophen (Tylenol) & its is not reccommended that you use additional acetaminophen (Tylenol) while taking this medication. Take your full course of antibiotics. Read the instructions below. ° °Eat a soft or liquid diet and rinse your mouth out after meals with warm water. You should see a dentist or return here at once if you have increased swelling, increased pain or uncontrolled bleeding from the site of your injury. ° ° °SEEK MEDICAL CARE IF:  °· You have increased pain not controlled with medicines.  °· You have swelling around your tooth, in your face or neck.  °· You have bleeding which starts, continues, or gets worse.  °· You have a fever >101 °· If you are unable to open your mouth °Soft Diet  °The soft diet may be recommended after you were put on a full liquid diet. A normal diet may follow. The soft diet can also be used after surgery if you are too ill to keep down a normal diet. The soft diet may also be needed if you have a hard time chewing foods.  °DESCRIPTION  °Tender foods are used. Foods do not need to be ground or pureed. Most raw fruits and vegetables and coarse breads and cereals should be avoided. Fried foods and highly seasoned foods may cause discomfort.  °NUTRITIONAL ADEQUACY  °A healthy diet is possible if foods from each of the basic food groups are eaten daily.  °SOFT DIET FOOD LISTS  °Milk/Dairy  °Allowed: Milk and milk  drinks, milk shakes, cream cheese, cottage cheese, mild cheeses.  °Avoid: Sharp or highly seasoned cheese. °Meat/Meat Substitutes  °Allowed: Broiled, roasted, baked, or stewed tender lean beef, mutton, lamb, veal, chicken, turkey, liver, ham, crisp bacon, white fish, tuna, salmon. Eggs, smooth peanut butter.  °Avoid: All fried meats, fish, or fowl. Rich gravies and sauces. Lunch meats, sausages, hot dogs. Meats with gristle, chunky peanut butter. °Breads/Grains  °Allowed: Rice, noodles, spaghetti, macaroni. Dry or cooked refined cereals, such as farina, cream of wheat, oatmeal, grits, whole-wheat cereals. Plain or toasted white or wheat blend or whole-grain breads, soda crackers or saltines, flour tortillas.  °Avoid: Wild rice, coarse cereals, such as bran. Seed in or on breads and crackers. Bread or bread products with nuts or seeds. °Fruits/Vegetables  °Allowed: Fruit and vegetable juices, well-cooked or canned fruits and vegetables, any dried fruit. One citrus fruit daily, 1 vitamin A source daily. Well-ripened, easy to chew fruits, sweet potatoes. Baked, boiled, mashed, creamed, scalloped, or au gratin potatoes. Broths or creamed soups made with allowed vegetables, strained tomatoes.  °Avoid: All gas-forming vegetables (corn, radishes, Brussels sprouts, onions, broccoli, cabbage, parsnips, turnips, chili peppers, pinto beans, split peas, dried beans). Fruits containing seeds and skin. Potato chips and corn chips. All others that are not made with allowed vegetables. Highly seasoned soups. °Desserts/Sweets  °Allowed: Simple desserts, such as custard, junkets, gelatin desserts, plain ice cream and sherbets, simple cakes   and cookies, allowed fruits, sugar, syrup, jelly, honey, plain hard candy, and molasses.  °Avoid: Rich pastries, any dessert containing dates, nuts, raisins, or coconut. Fried pastries, such as doughnuts. Chocolate. °Beverages  °Allowed: Fruit and vegetable juices. Caffeine-free carbonated drinks,  coffee, and tea.  °Avoid: Caffeinated beverages: coffee, tea, soda or pop. °Miscellaneous  °Allowed: Butter, cream, margarine, mayonnaise, oil. Cream sauces, salt, and mild spices.  °Avoid: Highly spiced salad dressings. Highly seasoned foods, hot sauce, mustard, horseradish, and pepper. °SAMPLE MENU  °Breakfast  °Orange juice.  °Oatmeal.  °Soft cooked egg.  °Toast and margarine.  °2% milk.  °Coffee. °Lunch  °Meatloaf.  °Mashed potato.  °Green beans.  °Lemon pudding.  °Bread and margarine.  °Coffee. °Dinner  °Consommé or apricot nectar.  °Chicken breast.  °Rice, peas, and carrots.  °Applesauce.  °Bread and margarine.  °2% milk. °To cut the amount of fat in your diet, omit margarine and use 1% or skim milk.  °NUTRIENT ANALYSIS  °Calories........................1953 Kcal.  °Protein.........................102 gm.  °Carbohydrate...............247 gm.  °Fat................................65 gm.  °Cholesterol...................449 mg.  °Dietary fiber.................19 gm.  °Vitamin A.....................2944 RE.  °Vitamin C.....................79 mg.  °Niacin..........................25 mg.  °Riboflavin....................2.0 mg.  °Thiamin.......................1.5 mg.  °Folate..........................249 mcg.  °Calcium.......................1030 mg.  °Phosphorus.................1782 mg.  °Zinc..............................12 mg.  °Iron..............................13 mg.  °Sodium.........................299 mg.  °Potassium....................3046 mg. °Document Released: 07/28/2007 Document Revised: 07/13/2011 Document Reviewed: 07/28/2007  °ExitCare® Patient Information ©2014 [employer].  ° °RESOURCE GUIDE ° ° °Dental Problems ° °Dr. Janna Civilis °$200 dollar visit °[address] °[place], [place] [address]  °[phone] °  ° °Patients with Medicaid: °[facility]                     [facility] °[address]                                           [address] °Phone:   [phone]                                                  Phone:  510-2600 ° °If unable to pay or uninsured, contact:  [facility] or [facility] to become qualified for the adult dental clinic. ° °Chronic Pain Problems °Contact [facility]  [phone] °Patients need to be referred by their primary care doctor. ° °Insufficient Money for Medicine °Contact [employer]:  call "211" or [facility] 271-5999. ° °No Primary Care Doctor °Call [facility]  832-8000 °Other agencies that provide inexpensive medical care °   [facility]  [phone] °   [facility]  832-7272 °   [facility]  [phone] °   [facility]  832-4777 °   [facility]  373-0678 °   [facility]  272-1050 ° °Psychological Services °[facility]  [phone] °[facility]  [phone] °[facility]   800 853-5163 (emergency services 641-4993) ° °Substance Abuse Resources °[facility]  336-882-2125 °[facility] 336-784-9470 °[facility] 336-285-9073 °[facility] 336-845-3988 °Residential & Outpatient Substance Abuse Program  800-659-3381 ° °Abuse/Neglect °[place] [facility] (336) 641-3795 °[place] [facility] Hotline 800-378-5315 (After Hours) ° °Emergency Shelter °[facility] (336) 271-5985 ° °Maternity Homes °[facility] (336) 275-9566 °[facility] (704) 372-4663 ° °MRSA Hotline #:   832-7006 ° ° ° °[place] [facility] ° °[facility] [facility]     [employer]                          [facility] °[address] [place]                       [address]      [address]  °[place]                                                [place]                            [place] °Phone:  349-3220                                   Phone:  [phone]                 Phone:   342-8140 ° °[facility] °Phone:  342-8316 ° °[place] [facility] Hotline °(336) 342-1394 °(336) 342-3537 (After Hours) ° ° ° ° ° ° °

## 2013-07-30 NOTE — ED Provider Notes (Signed)
CSN: [id number]     Arrival date & time 07/30/13  1208 History   First MD Initiated Contact with Patient 07/30/13 1229    This chart was scribed for non-physician practitioner, Arthor Captain, working with Richardean Canal, MD by Marica Otter, ED Scribe. This patient was seen in room WTR6/WTR6 and the patient's care was started at 1:06 PM.  Chief Complaint  Patient presents with  . Dental Pain   HPI HPI Comments: Arsalan Brisbin is a 48 y.o. male, with a history of DM, HLD and HTN, who presents to the Emergency Department complaining of constant left lower dental pain onset several days ago,worsening recently. Pt reports that the pain worsens when eating. Pt denies fever or chills or painful swallow.  Past Medical History  Diagnosis Date  . Diabetes mellitus 2005  . GERD (gastroesophageal reflux disease)     mild, uses OTCS  . Hyperlipidemia   . Hypertension    Past Surgical History  Procedure Laterality Date  . No past surgeries     Family History  Problem Relation Age of Onset  . Heart disease Father     died at age 5  . Stroke Father   . Diabetes Father   . Hypertension Father   . Colon cancer Neg Hx   . Prostate cancer Neg Hx    History  Substance Use Topics  . Smoking status: Never Smoker   . Smokeless tobacco: Never Used  . Alcohol Use: Yes     Comment: socially    Review of Systems  Constitutional: Negative for fever and chills.  HENT: Positive for dental problem. Negative for trouble swallowing and voice change.       Allergies  Review of patient's allergies indicates no known allergies.  Home Medications   Current Outpatient Rx  Name  Route  Sig  Dispense  Refill  . hydrochlorothiazide (HYDRODIURIL) 25 MG tablet      TAKE 1 TABLET DAILY   90 tablet   1   . metFORMIN (GLUCOPHAGE) 1000 MG tablet   Oral   Take 1 tablet (1,000 mg total) by mouth 2 (two) times daily with a meal.   180 tablet   1   . metoprolol succinate (TOPROL-XL) 25 MG 24 hr tablet      TAKE 1 TABLET DAILY   90 tablet   1   . pioglitazone (ACTOS) 30 MG tablet   Oral   Take 1 tablet (30 mg total) by mouth daily.   30 tablet   6   . pravastatin (PRAVACHOL) 20 MG tablet      TAKE 1 TABLET AT BEDTIME   90 tablet   1   . ranitidine (ZANTAC) 300 MG tablet      TAKE 1 TABLET AT BEDTIME   90 tablet   0   . sitaGLIPtin (JANUVIA) 100 MG tablet      TAKE 1 TABLET DAILY   90 tablet   1   . VIAGRA 50 MG tablet      TAKE 1 TABLET AS NEEDED FOR ERECTILE DYSFUNCTION   5 tablet   4   . zolpidem (AMBIEN) 10 MG tablet   Oral   Take 1 tablet (10 mg total) by mouth at bedtime as needed.   30 tablet   3    BP 141/88  Pulse 80  Temp(Src) 98.3 F (36.8 C) (Oral)  Resp 16  SpO2 98% Physical Exam  Nursing note and vitals reviewed. Constitutional: He is oriented  to person, place, and time. He appears well-developed and well-nourished. No distress.  HENT:  Head: Normocephalic and atraumatic.  Mouth/Throat: Oropharynx is clear and moist.  Left lower side first molar open cavity where there was previously a filling. Gingival erythema or fluctuance.   Eyes: EOM are normal.  Neck: Neck supple. No tracheal deviation present.  Cardiovascular: Normal rate.   Pulmonary/Chest: Effort normal. No respiratory distress.  Musculoskeletal: Normal range of motion.  Neurological: He is alert and oriented to person, place, and time.  Skin: Skin is warm and dry.  Psychiatric: He has a normal mood and affect. His behavior is normal.    ED Course  Procedures (including critical care time) DIAGNOSTIC STUDIES: Oxygen Saturation is 98% on RA, adequate by my interpretation.    COORDINATION OF CARE: 1:08 PM-Discussed treatment plan which includes pain meds and dentist referral with pt at bedside and pt agreed to plan.   Labs Review Labs Reviewed - No data to display Imaging Review No results found.   EKG Interpretation None      MDM   Final diagnoses:  Pain,  dental   Patient with toothache.  No gross abscess.  Exam unconcerning for Ludwig's angina or spread of infection.  Will treat with penicillin and pain medicine.  Urged patient to follow-up with dentist.    I personally performed the services described in this documentation, which was scribed in my presence. The recorded information has been reviewed and is accurate.      Arthor CaptainAbigail Kashus Karlen, PA-C 07/30/13 1416

## 2013-07-30 NOTE — ED Provider Notes (Signed)
Medical screening examination/treatment/procedure(s) were performed by non-physician practitioner and as supervising physician I was immediately available for consultation/collaboration.   EKG Interpretation None        Richardean Canalavid H Yao, MD 07/30/13 1454

## 2013-11-20 ENCOUNTER — Telehealth: Payer: Self-pay

## 2013-11-20 DIAGNOSIS — E785 Hyperlipidemia, unspecified: Secondary | ICD-10-CM

## 2013-11-20 DIAGNOSIS — E119 Type 2 diabetes mellitus without complications: Secondary | ICD-10-CM

## 2013-11-20 NOTE — Telephone Encounter (Signed)
Diabetic bundle  Pt informed that he was due to have his A1C and Lipid panel checked and needs a nurse visit to recheck BP.  Pt stated he will call back another time to schedule this.  Lab order placed

## 2013-11-23 ENCOUNTER — Encounter: Payer: Self-pay | Admitting: *Deleted

## 2013-11-23 ENCOUNTER — Ambulatory Visit (INDEPENDENT_AMBULATORY_CARE_PROVIDER_SITE_OTHER): Payer: Managed Care, Other (non HMO)

## 2013-11-23 ENCOUNTER — Other Ambulatory Visit: Payer: Self-pay | Admitting: *Deleted

## 2013-11-23 ENCOUNTER — Ambulatory Visit (INDEPENDENT_AMBULATORY_CARE_PROVIDER_SITE_OTHER): Payer: Managed Care, Other (non HMO) | Admitting: *Deleted

## 2013-11-23 VITALS — BP 118/82 | HR 79

## 2013-11-23 DIAGNOSIS — E785 Hyperlipidemia, unspecified: Secondary | ICD-10-CM

## 2013-11-23 DIAGNOSIS — E119 Type 2 diabetes mellitus without complications: Secondary | ICD-10-CM

## 2013-11-23 DIAGNOSIS — I10 Essential (primary) hypertension: Secondary | ICD-10-CM

## 2013-11-23 LAB — LIPID PANEL
Cholesterol: 131 mg/dL (ref 0–200)
HDL: 50.2 mg/dL (ref >39.00)
LDL Cholesterol: 63 mg/dL (ref 0–99)
NonHDL: 80.8
Total CHOL/HDL Ratio: 3
Triglycerides: 90 mg/dL (ref 0.0–149.0)
VLDL: 18 mg/dL (ref 0.0–40.0)

## 2013-11-23 LAB — HEMOGLOBIN A1C: HEMOGLOBIN A1C: 6.6 % — AB (ref 4.6–6.5)

## 2013-11-23 MED ORDER — PIOGLITAZONE HCL 30 MG PO TABS: 30.0000 mg | ORAL_TABLET | Freq: Every day | ORAL | Status: DC

## 2013-11-23 NOTE — Progress Notes (Signed)
Pt came in for BP check per Diabetes bundle.  Pt had not taking his BP meds because he was also getting fasting labs drawn.//AB/CMA

## 2013-11-24 ENCOUNTER — Ambulatory Visit: Payer: Managed Care, Other (non HMO)

## 2013-11-24 DIAGNOSIS — E119 Type 2 diabetes mellitus without complications: Secondary | ICD-10-CM

## 2013-11-24 LAB — ALT: ALT: 25 U/L (ref 0–53)

## 2013-11-24 LAB — AST: AST: 20 U/L (ref 0–37)

## 2013-11-28 ENCOUNTER — Encounter: Payer: Self-pay | Admitting: *Deleted

## 2014-03-23 ENCOUNTER — Other Ambulatory Visit: Payer: Self-pay | Admitting: Internal Medicine

## 2014-03-26 NOTE — Telephone Encounter (Signed)
Rx printed. Letter printed and mailed to Pt informing him he is due for OV.

## 2014-03-26 NOTE — Telephone Encounter (Signed)
Pt is requesting refill on Ambien  Last OV: 07/10/2013 Last Fill: 07/10/2013 # 30 3RF UDS: None   Please advise.

## 2014-03-26 NOTE — Telephone Encounter (Signed)
Advise patient, he is overdue for office visit. Please print a prescription for Ambien #15 no refills. No further refills without office visit

## 2014-04-16 ENCOUNTER — Other Ambulatory Visit: Payer: Self-pay | Admitting: Internal Medicine

## 2014-05-11 ENCOUNTER — Other Ambulatory Visit: Payer: Self-pay | Admitting: Internal Medicine

## 2014-09-10 ENCOUNTER — Ambulatory Visit (INDEPENDENT_AMBULATORY_CARE_PROVIDER_SITE_OTHER): Payer: Managed Care, Other (non HMO) | Admitting: Internal Medicine

## 2014-09-10 ENCOUNTER — Telehealth: Payer: Self-pay | Admitting: Internal Medicine

## 2014-09-10 ENCOUNTER — Other Ambulatory Visit: Payer: Self-pay

## 2014-09-10 ENCOUNTER — Encounter: Payer: Self-pay | Admitting: Internal Medicine

## 2014-09-10 VITALS — BP 122/82 | HR 77 | Temp 98.7°F | Ht 73.0 in | Wt 275.2 lb

## 2014-09-10 DIAGNOSIS — I10 Essential (primary) hypertension: Secondary | ICD-10-CM | POA: Diagnosis not present

## 2014-09-10 DIAGNOSIS — E119 Type 2 diabetes mellitus without complications: Secondary | ICD-10-CM | POA: Diagnosis not present

## 2014-09-10 DIAGNOSIS — M25519 Pain in unspecified shoulder: Secondary | ICD-10-CM | POA: Insufficient documentation

## 2014-09-10 DIAGNOSIS — M25511 Pain in right shoulder: Secondary | ICD-10-CM | POA: Diagnosis not present

## 2014-09-10 LAB — COMPREHENSIVE METABOLIC PANEL
ALBUMIN: 4.1 g/dL (ref 3.5–5.2)
ALK PHOS: 93 U/L (ref 39–117)
ALT: 21 U/L (ref 0–53)
AST: 18 U/L (ref 0–37)
BUN: 11 mg/dL (ref 6–23)
CALCIUM: 9.4 mg/dL (ref 8.4–10.5)
CO2: 31 mEq/L (ref 19–32)
CREATININE: 0.8 mg/dL (ref 0.40–1.50)
Chloride: 100 mEq/L (ref 96–112)
GFR: 132.34 mL/min (ref >60.00)
GLUCOSE: 122 mg/dL — AB (ref 70–99)
POTASSIUM: 3.4 meq/L — AB (ref 3.5–5.1)
Sodium: 138 mEq/L (ref 135–145)
Total Bilirubin: 0.5 mg/dL (ref 0.2–1.2)
Total Protein: 7.2 g/dL (ref 6.0–8.3)

## 2014-09-10 LAB — HEMOGLOBIN A1C: HEMOGLOBIN A1C: 6.5 % (ref 4.6–6.5)

## 2014-09-10 MED ORDER — RANITIDINE HCL 300 MG PO TABS
300.0000 mg | ORAL_TABLET | Freq: Every day | ORAL | Status: DC
Start: 2014-09-10 — End: 2016-07-27

## 2014-09-10 MED ORDER — HYDROCHLOROTHIAZIDE 25 MG PO TABS: 25.0000 mg | ORAL_TABLET | Freq: Every day | ORAL | Status: DC

## 2014-09-10 MED ORDER — METFORMIN HCL 1000 MG PO TABS: 1000.0000 mg | ORAL_TABLET | Freq: Two times a day (BID) | ORAL | Status: DC

## 2014-09-10 MED ORDER — METOPROLOL SUCCINATE ER 25 MG PO TB24: 25.0000 mg | ORAL_TABLET | Freq: Every day | ORAL | Status: DC

## 2014-09-10 MED ORDER — RANITIDINE HCL 300 MG PO TABS: 300.0000 mg | ORAL_TABLET | Freq: Every day | ORAL | Status: DC

## 2014-09-10 MED ORDER — HYDROCODONE-ACETAMINOPHEN 5-325 MG PO TABS: 1.0000 | ORAL_TABLET | Freq: Every evening | ORAL | Status: DC | PRN

## 2014-09-10 MED ORDER — SITAGLIPTIN PHOSPHATE 100 MG PO TABS: 100.0000 mg | ORAL_TABLET | Freq: Every day | ORAL | Status: DC

## 2014-09-10 MED ORDER — PIOGLITAZONE HCL 30 MG PO TABS: 30.0000 mg | ORAL_TABLET | Freq: Every day | ORAL | Status: DC

## 2014-09-10 MED ORDER — PRAVASTATIN SODIUM 20 MG PO TABS: 20.0000 mg | ORAL_TABLET | Freq: Every day | ORAL | Status: DC

## 2014-09-10 MED ORDER — PRAVASTATIN SODIUM 20 MG PO TABS
20.0000 mg | ORAL_TABLET | Freq: Every day | ORAL | Status: DC
Start: 2014-09-10 — End: 2014-09-10

## 2014-09-10 NOTE — Telephone Encounter (Signed)
Caller name: Kathy Relation to pt: self Call back number: [phone] [facility]  Reason for call:   Patient states that all meds from today's visit should have gone to [facility], not [facility]. Please resend.

## 2014-09-10 NOTE — Assessment & Plan Note (Signed)
Good compliance w/  medication, check labs 

## 2014-09-10 NOTE — Progress Notes (Signed)
Pre visit review using our clinic review tool, if applicable. No additional management support is needed unless otherwise documented below in the visit note. 
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
## 2014-09-10 NOTE — Patient Instructions (Signed)
Get your blood work before you leave    For pain: IBUPROFEN (Advil or Motrin) 200 mg 2 tablets every 6 hours as needed for pain.  Always take it with food because may cause gastritis and ulcers.  If you notice nausea, stomach pain, change in the color of stools --->  Stop the medicine and let us know\  At nighttime, take hydrocodone as needed for pain. Will cause drowsiness    Come back to the office in 3-4 months   for a routine check up

## 2014-09-10 NOTE — Telephone Encounter (Signed)
Rx's resent to [facility] 

## 2014-09-10 NOTE — Assessment & Plan Note (Signed)
Seems well controlled with current meds, check labs

## 2014-09-10 NOTE — Assessment & Plan Note (Signed)
Acute shoulder pain, suspect internal derangement. Refer to Ortho. Pain control with ibuprofen, see instructions, GI precautions discussed. 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

## 2014-09-28 ENCOUNTER — Other Ambulatory Visit: Payer: Self-pay | Admitting: Internal Medicine

## 2015-02-22 ENCOUNTER — Encounter: Payer: Self-pay | Admitting: Internal Medicine

## 2015-02-22 ENCOUNTER — Ambulatory Visit (INDEPENDENT_AMBULATORY_CARE_PROVIDER_SITE_OTHER): Payer: Managed Care, Other (non HMO) | Admitting: Internal Medicine

## 2015-02-22 VITALS — BP 118/76 | HR 76 | Temp 97.8°F | Ht 73.0 in | Wt 275.4 lb

## 2015-02-22 DIAGNOSIS — I10 Essential (primary) hypertension: Secondary | ICD-10-CM | POA: Diagnosis not present

## 2015-02-22 DIAGNOSIS — E119 Type 2 diabetes mellitus without complications: Secondary | ICD-10-CM | POA: Diagnosis not present

## 2015-02-22 DIAGNOSIS — N528 Other male erectile dysfunction: Secondary | ICD-10-CM | POA: Diagnosis not present

## 2015-02-22 DIAGNOSIS — Z09 Encounter for follow-up examination after completed treatment for conditions other than malignant neoplasm: Secondary | ICD-10-CM

## 2015-02-22 LAB — HEMOGLOBIN A1C
Hgb A1c MFr Bld: 6.2 % — ABNORMAL HIGH (ref <5.7)
MEAN PLASMA GLUCOSE: 131 mg/dL — AB (ref <117)

## 2015-02-22 MED ORDER — SILDENAFIL CITRATE 20 MG PO TABS: 40.0000 mg | ORAL_TABLET | Freq: Every day | ORAL | Status: DC | PRN

## 2015-02-22 NOTE — Progress Notes (Signed)
Subjective:    Patient ID: Bruce Henson, male    DOB: 1966-01-29, 49 y.o.   MRN: [id number]  DOS:  02/22/2015 Type of visit - description : Routine visit Interval history: DM: Reports good compliance with all medications, diet needs improvement, he remains active. Hypertension: Compliance of medication, no ambulatory BPs High cholesterol: On statins, reports no myalgias Erectile dysfunction, needs a refill on Viagra, cost is an issue   Review of Systems  Denies chest pain, difficulty breathing or lower extremity edema No lower extremity paresthesias No nausea, vomiting, diarrhea. GERD symptoms well controlled  Past Medical History  Diagnosis Date  . Diabetes mellitus 2005  . GERD (gastroesophageal reflux disease)     mild, uses OTCS  . Hyperlipidemia   . Hypertension     Past Surgical History  Procedure Laterality Date  . No past surgeries      Social History   Social History  . Marital Status: Single    Spouse Name: N/A  . Number of Children: 2  . Years of Education: N/A   Occupational History  . Heavy Arboriculturist    Social History Main Topics  . Smoking status: Never Smoker   . Smokeless tobacco: Never Used  . Alcohol Use: Yes     Comment: socially  . Drug Use: No  . Sexual Activity: Not on file   Other Topics Concern  . Not on file   Social History Narrative   Divorced, remarried, 2 children                    Medication List       This list is accurate as of: 02/22/15 11:59 PM.  Always use your most recent med list.               hydrochlorothiazide 25 MG tablet  Commonly known as:  HYDRODIURIL  Take 1 tablet (25 mg total) by mouth daily.     ibuprofen 200 MG tablet  Commonly known as:  ADVIL,MOTRIN  Take 600 mg by mouth every 6 (six) hours as needed for moderate pain.     metFORMIN 1000 MG tablet  Commonly known as:  GLUCOPHAGE  Take 1 tablet (1,000 mg total) by mouth 2 (two) times daily with a meal.     metoprolol  succinate 25 MG 24 hr tablet  Commonly known as:  TOPROL-XL  Take 1 tablet (25 mg total) by mouth daily.     pioglitazone 30 MG tablet  Commonly known as:  ACTOS  Take 1 tablet (30 mg total) by mouth daily.     pravastatin 20 MG tablet  Commonly known as:  PRAVACHOL  Take 1 tablet (20 mg total) by mouth at bedtime.     ranitidine 300 MG tablet  Commonly known as:  ZANTAC  Take 1 tablet (300 mg total) by mouth at bedtime.     sildenafil 20 MG tablet  Commonly known as:  REVATIO  Take 2-3 tablets (40-60 mg total) by mouth daily as needed.     sitaGLIPtin 100 MG tablet  Commonly known as:  JANUVIA  Take 1 tablet (100 mg total) by mouth daily. DUE FOR APPT WITH DR PAZ [phone].     zolpidem 10 MG tablet  Commonly known as:  AMBIEN  Take 1 tablet at bedtime as needed. OVER DUE FOR APPT WITH DR PAZ. NO FURTHER REFILLS. [phone].           Objective:   Physical Exam  BP 118/76 mmHg  Pulse 76  Temp(Src) 97.8 F (36.6 C) (Oral)  Ht 6\' 1"  (1.854 m)  Wt 275 lb 6 oz (124.909 kg)  BMI 36.34 kg/m2  SpO2 96% General:   Well developed, well nourished . NAD.  HEENT:  Normocephalic . Face symmetric, atraumatic Lungs:  CTA B Normal respiratory effort, no intercostal retractions, no accessory muscle use. Heart: RRR,  no murmur.  No pretibial edema bilaterally  Diabetic feet exam: Good pedal pulses, skin normal, pinprick examination negative Neurologic:  alert & oriented X3.  Speech normal, gait appropriate for age and unassisted Psych--  Cognition and judgment appear intact.  Cooperative with normal attention span and concentration.  Behavior appropriate. No anxious or depressed appearing.      Assessment & Plan:    Assessment>  DM 2005 HTN Hyperlipidemia GERD  Plan: DM: diet- exercise discussed, self-education encourage. Foot exam negative today. Reports a negative eye exam 2015. Will check A1c and microalbumin. Has a glucometer but states he won't check his blood  sugars. HTN: No ambulatory BPs, BP today very good, check a BMP ED: Refill viagra Primary care:  Declined  Flu shot and pneumonia shot, benefits discussed RTC: 4-5 months for a physical fasting

## 2015-02-22 NOTE — Patient Instructions (Signed)
Get your blood work before you leave     If you need more information about a healthy diet, visit  the American Heart Association, it  is a Radiographer, therapeuticgreat resource online at:  [email or web address].[email or web address]://www.heart.org/HEARTORG/  All about diabetes, great resource!  http://www.molina.com/http://www.joslin.org/diabetes-information.html    Next visit  for a physical exam in 4-5 months, fasting  Please schedule an appointment at the front desk

## 2015-02-22 NOTE — Progress Notes (Signed)
Pre visit review using our clinic review tool, if applicable. No additional management support is needed unless otherwise documented below in the visit note. 

## 2015-02-23 DIAGNOSIS — Z09 Encounter for follow-up examination after completed treatment for conditions other than malignant neoplasm: Secondary | ICD-10-CM | POA: Insufficient documentation

## 2015-02-23 LAB — MICROALBUMIN / CREATININE URINE RATIO
Creatinine, Urine: 220 mg/dL (ref 20–370)
MICROALB UR: 0.7 mg/dL
Microalb Creat Ratio: 3 mcg/mg creat (ref <30)

## 2015-02-23 LAB — BASIC METABOLIC PANEL
BUN: 8 mg/dL (ref 7–25)
CO2: 29 mmol/L (ref 20–31)
CREATININE: 0.84 mg/dL (ref 0.60–1.35)
Calcium: 9.2 mg/dL (ref 8.6–10.3)
Chloride: 100 mmol/L (ref 98–110)
Glucose, Bld: 110 mg/dL — ABNORMAL HIGH (ref 65–99)
Potassium: 4.1 mmol/L (ref 3.5–5.3)
Sodium: 140 mmol/L (ref 135–146)

## 2015-02-23 NOTE — Assessment & Plan Note (Signed)
DM: diet- exercise discussed, self-education encourage. Foot exam negative today. Reports a negative eye exam 2015. Will check A1c and microalbumin. Has a glucometer but states he won't check his blood sugars. HTN: No ambulatory BPs, BP today very good, check a BMP ED: Refill viagra Primary care:  Declined  Flu shot and pneumonia shot, benefits discussed RTC: 4-5 months for a physical fasting

## 2015-02-26 ENCOUNTER — Telehealth: Payer: Self-pay | Admitting: *Deleted

## 2015-02-26 NOTE — Telephone Encounter (Signed)
PA for sildenafil tab initiated. Awaiting determination. JG//CMA

## 2015-03-04 ENCOUNTER — Other Ambulatory Visit: Payer: Self-pay | Admitting: Internal Medicine

## 2015-03-04 NOTE — Telephone Encounter (Signed)
I prescribed generic sildenafil,  will have to buy out of pocket. May not be very expensive

## 2015-03-04 NOTE — Telephone Encounter (Signed)
PA denied because med is not covered for men when being used to treat ED. Please advise. JG//CMA

## 2015-03-05 ENCOUNTER — Telehealth: Payer: Self-pay | Admitting: Internal Medicine

## 2015-03-05 NOTE — Telephone Encounter (Signed)
Relation to UJ:WJXBpt:self Call back number:[phone] [facility] [place] [address] [phone] (Phone) [phone] (Fax)        Reason for call:  As per pharmacy sildenafil (REVATIO) 20 MG tablet was denied by provider. Please advise

## 2015-03-05 NOTE — Telephone Encounter (Signed)
See other phone note from 02/26/2015 for details.

## 2015-03-25 ENCOUNTER — Other Ambulatory Visit: Payer: Self-pay

## 2015-03-25 MED ORDER — PRAVASTATIN SODIUM 20 MG PO TABS: 20.0000 mg | ORAL_TABLET | Freq: Every day | ORAL | Status: DC

## 2015-04-01 ENCOUNTER — Telehealth: Payer: Self-pay | Admitting: Internal Medicine

## 2015-04-01 ENCOUNTER — Other Ambulatory Visit: Payer: Self-pay

## 2015-04-01 DIAGNOSIS — S42209D Unspecified fracture of upper end of unspecified humerus, subsequent encounter for fracture with routine healing: Secondary | ICD-10-CM

## 2015-04-01 NOTE — Telephone Encounter (Signed)
Referral placed, Pt needs ED F/U visit at his convenience. Sending phone note to Dr[facility] Drue NovelPaz [facility].

## 2015-04-01 NOTE — Telephone Encounter (Addendum)
Okay, for now all he needs is to see the orthopedic doctor  unless the broken bone  was due to a syncope

## 2015-04-01 NOTE — Telephone Encounter (Signed)
Caller name: Bdetty Relationship to patient: Can be reached:[phone] Pharmacy:  Reason for call:[facility]  They saw the patient directly from the emergency room and they need a referral for insurance  Fax # [phone]
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
## 2015-04-24 ENCOUNTER — Ambulatory Visit: Payer: Managed Care, Other (non HMO) | Attending: Orthopaedic Surgery | Admitting: Physical Therapy

## 2015-04-24 ENCOUNTER — Encounter: Payer: Self-pay | Admitting: Physical Therapy

## 2015-04-24 DIAGNOSIS — M25512 Pain in left shoulder: Secondary | ICD-10-CM

## 2015-04-24 DIAGNOSIS — M25612 Stiffness of left shoulder, not elsewhere classified: Secondary | ICD-10-CM
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
## 2015-04-30 ENCOUNTER — Encounter: Payer: Self-pay | Admitting: Physical Therapy

## 2015-04-30 ENCOUNTER — Ambulatory Visit: Payer: Managed Care, Other (non HMO) | Admitting: Physical Therapy

## 2015-04-30 DIAGNOSIS — M25512 Pain in left shoulder: Secondary | ICD-10-CM | POA: Diagnosis not present

## 2015-04-30 DIAGNOSIS — M25612 Stiffness of left shoulder, not elsewhere classified: Secondary | ICD-10-CM

## 2015-04-30 NOTE — Therapy (Signed)
[facility] [place] [address] [place], [place], [address] Phone: [phone]   Fax:  [phone]  Physical Therapy Treatment  Patient Details  Name: Bruce GangBengy C Henson MRN: [id number] Date of Birth: 08/05/1965 Referring Provider: Cleophas DunkerWhitfield  Encounter Date: 04/30/2015      PT End of Session - 04/30/15 1011    Visit Number 2   Date for PT Re-Evaluation 06/25/15   PT Start Time 0930   PT Stop Time 1025   PT Time Calculation (min) 55 min      Past Medical History  Diagnosis Date  . Diabetes mellitus 2005  . GERD (gastroesophageal reflux disease)     mild, uses OTCS  . Hyperlipidemia   . Hypertension     Past Surgical History  Procedure Laterality Date  . No past surgeries      There were no vitals filed for this visit.  Visit Diagnosis:  Shoulder stiffness, left  Left shoulder pain      Subjective Assessment - 04/30/15 0929    Subjective Pt repots compliance with HEP, "Everything has been going all right"   Currently in Pain? No/denies   Pain Score 0-No pain                         [facility] Adult PT Treatment/Exercise - 04/30/15 0001    Shoulder Exercises   Other  Exercises ball on table 25 CW & CCW;Bak/frd x50   Shoulder Exercises: Standing   Other Standing Exercises shrugs no weight x15, scapular retraction no weight x 15   Shoulder Exercises: Pulleys   Flexion 2 minutes   Other Pulley Exercises scaption x25   Shoulder Exercises: ROM/Strengthening   UBE (Upper Arm Bike) L1 693frd/3rev   Manual Therapy   Manual Therapy Passive ROM                  PT Short Term Goals - 04/24/15 1138    PT SHORT TERM GOAL #1   Title independent with initial HEP   Time 2   Period Weeks   Status New           PT Long Term Goals - 04/24/15 1138    PT LONG TERM GOAL #1   Title decrease pain 50%   Time 8   Period Weeks   Status New   PT LONG TERM GOAL #2   Title increase AROM of  flexion to 140 degrees   Time 8   Period Weeks   Status New   PT LONG TERM GOAL #3   Title be able to dress without pain   Time 8   Period Weeks   Status New   PT LONG TERM GOAL #4   Title lift 10# overhead   Time 8   Period Weeks   Status New               Plan - 04/30/15 1011    Clinical Impression Statement Pt tolerated more PROM interventions this day. Pt instructed not to use LUE while on UBE. Does report pain at end ranges with MT. Otherwise pt tolerated treatment well. Reports that he likes doing pulley intervention.    Pt will benefit from skilled therapeutic intervention in order to improve on the following deficits Decreased range of motion;Decreased strength;Increased muscle spasms;Impaired flexibility;Postural dysfunction;Pain   Rehab Potential Good   PT Frequency 2x / week   PT Duration 8 weeks  PT Treatment/Interventions Moist Heat;Electrical Stimulation;Cryotherapy;Functional mobility training;Therapeutic activities;Therapeutic exercise;Manual techniques;Vasopneumatic Device;Patient/family education;Passive range of motion   PT Next Visit Plan PROM until he sees MD        Problem List Patient Active Problem List   Diagnosis Date Noted  . PCP NOTES >>> 02/23/2015  . Pain in joint, shoulder region 09/10/2014  . Annual physical exam 01/15/2012  . Insomnia 11/25/2010  . DM II (diabetes mellitus, type II), controlled (HCC) 04/01/2010  . HYPERLIPIDEMIA 04/01/2010  . Essential hypertension 04/01/2010  . GERD 04/01/2010    Grayce Sessions, PTA 04/30/2015, 10:16 AM  [facility] [place] [address] [place], [place], [address] Phone: [phone]   Fax:  [phone]  Name: Bruce Henson MRN: [id number] Date of Birth: [birth year]

## 2015-05-03 ENCOUNTER — Encounter: Payer: Self-pay | Admitting: Physical Therapy

## 2015-05-03 ENCOUNTER — Ambulatory Visit: Payer: Managed Care, Other (non HMO) | Admitting: Physical Therapy

## 2015-05-03 DIAGNOSIS — M25612 Stiffness of left shoulder, not elsewhere classified: Secondary | ICD-10-CM

## 2015-05-03 DIAGNOSIS — M25512 Pain in left shoulder: Secondary | ICD-10-CM

## 2015-05-03 NOTE — Therapy (Signed)
[facility] [place] [address] [place], [place], [address] Phone: [phone]   Fax:  [phone]  Physical Therapy Treatment  Patient Details  Name: Bruce Henson MRN: [id number] Date of Birth: 1965/07/25 Referring Provider: Cleophas Dunker  Encounter Date: 05/03/2015      PT End of Session - 05/03/15 1016    Visit Number 3   Date for PT Re-Evaluation 06/25/15   PT Start Time 0930   PT Stop Time 1028   PT Time Calculation (min) 58 min   Activity Tolerance Patient limited by pain   Behavior During Therapy [facility] for tasks assessed/performed      Past Medical History  Diagnosis Date  . Diabetes mellitus 2005  . GERD (gastroesophageal reflux disease)     mild, uses OTCS  . Hyperlipidemia   . Hypertension     Past Surgical History  Procedure Laterality Date  . No past surgeries      There were no vitals filed for this visit.  Visit Diagnosis:  Left shoulder pain  Shoulder stiffness, left      Subjective Assessment - 05/03/15 0930    Subjective "All right, All right"   Currently in Pain? No/denies   Pain Score 0-No pain                         [facility] Adult PT Treatment/Exercise - 05/03/15 0001    Shoulder Exercises: Pulleys   Flexion 2 minutes   ABduction 2 minutes   Other Pulley Exercises scaption x25   Shoulder Exercises: ROM/Strengthening   UBE (Upper Arm Bike) L1 84frd/3rev   Modalities   Modalities Cryotherapy;Electrical Stimulation   Cryotherapy   Number Minutes Cryotherapy 15 Minutes   Cryotherapy Location Shoulder   Type of Cryotherapy Ice pack   Electrical Stimulation   Electrical Stimulation Location left shoulder   Electrical Stimulation Action IFC   Electrical Stimulation Parameters tolerance   Electrical Stimulation Goals Pain   Manual Therapy   Manual Therapy Passive ROM   Manual therapy comments Pain full at end range    Passive ROM R shoulder all directions                   PT Short Term Goals - 04/24/15 1138    PT SHORT TERM GOAL #1   Title independent with initial HEP   Time 2   Period Weeks   Status New           PT Long Term Goals - 04/24/15 1138    PT LONG TERM GOAL #1   Title decrease pain 50%   Time 8   Period Weeks   Status New   PT LONG TERM GOAL #2   Title increase AROM of flexion to 140 degrees   Time 8   Period Weeks   Status New   PT LONG TERM GOAL #3   Title be able to dress without pain   Time 8   Period Weeks   Status New   PT LONG TERM GOAL #4   Title lift 10# overhead   Time 8   Period Weeks   Status New               Plan - 05/03/15 1016    Clinical Impression Statement Again all PROM this session due to MD orders, Pt tolerated all pulley interventions well. Pt does seem guarded with MT. Pt does have pain at end  ranges.    Pt will benefit from skilled therapeutic intervention in order to improve on the following deficits Decreased range of motion;Decreased strength;Increased muscle spasms;Impaired flexibility;Postural dysfunction;Pain   Rehab Potential Good   PT Frequency 2x / week   PT Duration 8 weeks   PT Treatment/Interventions Moist Heat;Electrical Stimulation;Cryotherapy;Functional mobility training;Therapeutic activities;Therapeutic exercise;Manual techniques;Vasopneumatic Device;Patient/family education;Passive range of motion   PT Next Visit Plan PROM until he sees MD        Problem List Patient Active Problem List   Diagnosis Date Noted  . PCP NOTES >>> 02/23/2015  . Pain in joint, shoulder region 09/10/2014  . Annual physical exam 01/15/2012  . Insomnia 11/25/2010  . DM II (diabetes mellitus, type II), controlled (HCC) 04/01/2010  . HYPERLIPIDEMIA 04/01/2010  . Essential hypertension 04/01/2010  . GERD 04/01/2010    Grayce Sessionsonald G Kathleene Bergemann, PTA  05/03/2015, 10:19 AM  [facility] [place] [address] [place], [place], [address] Phone: [phone]   Fax:  [phone]  Name: Bruce Henson MRN: [id number] Date of Birth: [birth year]

## 2015-05-07 ENCOUNTER — Encounter: Payer: Self-pay | Admitting: Physical Therapy

## 2015-05-07 ENCOUNTER — Ambulatory Visit: Payer: Managed Care, Other (non HMO) | Attending: Orthopaedic Surgery | Admitting: Physical Therapy

## 2015-05-07 DIAGNOSIS — M25512 Pain in left shoulder: Secondary | ICD-10-CM | POA: Diagnosis present

## 2015-05-07 DIAGNOSIS — M25612 Stiffness of left shoulder, not elsewhere classified: Secondary | ICD-10-CM | POA: Diagnosis present

## 2015-05-07 NOTE — Therapy (Signed)
[facility] [place] [address] [place], [place], [address] Phone: [phone]   Fax:  [phone]  Physical Therapy Treatment  Patient Details  Name: Bruce Henson MRN: [id number] Date of Birth: [birth year] Referring Provider: Cleophas DunkerWhitfield  Encounter Date: 05/07/2015      PT End of Session - 05/07/15 0930    Visit Number 4   Date for PT Re-Evaluation 06/25/15   PT Start Time 0845   PT Stop Time 0943   PT Time Calculation (min) 58 min   Activity Tolerance Patient limited by pain   Behavior During Therapy [facility] for tasks assessed/performed      Past Medical History  Diagnosis Date  . Diabetes mellitus 2005  . GERD (gastroesophageal reflux disease)     mild, uses OTCS  . Hyperlipidemia   . Hypertension     Past Surgical History  Procedure Laterality Date  . No past surgeries      There were no vitals filed for this visit.  Visit Diagnosis:  Shoulder stiffness, left  Left shoulder pain      Subjective Assessment - 05/07/15 0847    Subjective "Im doing good a little pain"   Currently in Pain? Yes   Pain Score 3    Pain Location Shoulder   Pain Orientation Left                         [facility] Adult PT Treatment/Exercise - 05/07/15 0001    Shoulder Exercises: Standing   Other Standing Exercises Ball up wall 3x15    Shoulder Exercises: Pulleys   Flexion 2 minutes   ABduction 2 minutes   Other Pulley Exercises scaption x25   Other Pulley Exercises IR    Shoulder Exercises: ROM/Strengthening   UBE (Upper Arm Bike) L3 343frd/3rev   Modalities   Modalities Vasopneumatic   Cryotherapy   Number Minutes Cryotherapy 15 Minutes   Cryotherapy Location Shoulder   Type of Cryotherapy Other (comment)   Vasopneumatic   Number Minutes Vasopneumatic  15 minutes   Vasopnuematic Location  Shoulder   Vasopneumatic Pressure Medium   Vasopneumatic Temperature  34   Manual Therapy   Manual Therapy Passive  ROM   Manual therapy comments Painfull at end range    Passive ROM R shoulder all directions                  PT Short Term Goals - 04/24/15 1138    PT SHORT TERM GOAL #1   Title independent with initial HEP   Time 2   Period Weeks   Status New           PT Long Term Goals - 04/24/15 1138    PT LONG TERM GOAL #1   Title decrease pain 50%   Time 8   Period Weeks   Status New   PT LONG TERM GOAL #2   Title increase AROM of flexion to 140 degrees   Time 8   Period Weeks   Status New   PT LONG TERM GOAL #3   Title be able to dress without pain   Time 8   Period Weeks   Status New   PT LONG TERM GOAL #4   Title lift 10# overhead   Time 8   Period Weeks   Status New               Plan - 05/07/15 0930  Clinical Impression Statement AROM per MD orders, Compleated all interventions well. Continues to havre pain at end ranges with MT   Pt will benefit from skilled therapeutic intervention in order to improve on the following deficits Decreased range of motion;Decreased strength;Increased muscle spasms;Impaired flexibility;Postural dysfunction;Pain   Rehab Potential Good   PT Frequency 2x / week   PT Duration 8 weeks   PT Treatment/Interventions Moist Heat;Electrical Stimulation;Cryotherapy;Functional mobility training;Therapeutic activities;Therapeutic exercise;Manual techniques;Vasopneumatic Device;Patient/family education;Passive range of motion   PT Next Visit Plan PROM until he sees MD        Problem List Patient Active Problem List   Diagnosis Date Noted  . PCP NOTES >>> 02/23/2015  . Pain in joint, shoulder region 09/10/2014  . Annual physical exam 01/15/2012  . Insomnia 11/25/2010  . DM II (diabetes mellitus, type II), controlled (HCC) 04/01/2010  . HYPERLIPIDEMIA 04/01/2010  . Essential hypertension 04/01/2010  . GERD 04/01/2010    Grayce Sessions, PTA  05/07/2015, 9:33 AM  [facility] [place] [address] [place], [place], [address] Phone: [phone]   Fax:  [phone]  Name: Bruce Henson MRN: [id number] Date of Birth: Nov 07, 1965

## 2015-05-10 ENCOUNTER — Ambulatory Visit: Payer: Managed Care, Other (non HMO) | Admitting: Physical Therapy

## 2015-05-10 ENCOUNTER — Encounter: Payer: Self-pay | Admitting: Physical Therapy

## 2015-05-10 DIAGNOSIS — M25512 Pain in left shoulder: Secondary | ICD-10-CM

## 2015-05-10 DIAGNOSIS — M25612 Stiffness of left shoulder, not elsewhere classified: Secondary | ICD-10-CM | POA: Diagnosis not present

## 2015-05-10 NOTE — Therapy (Signed)
[facility] [place] [place] [address] [place], [place], [address] Phone: [phone]   Fax:  [phone]  Physical Therapy Treatment  Patient Details  Name: Bruce Henson MRN: [id number] Date of Birth: 08/16/1965 Referring Provider: Cleophas DunkerWhitfield  Encounter Date: 05/10/2015      PT End of Session - 05/10/15 0924    Visit Number 5   Date for PT Re-Evaluation 06/25/15   PT Start Time 0840   PT Stop Time 0938   PT Time Calculation (min) 58 min   Activity Tolerance Patient limited by pain   Behavior During Therapy [facility] for tasks assessed/performed      Past Medical History  Diagnosis Date  . Diabetes mellitus 2005  . GERD (gastroesophageal reflux disease)     mild, uses OTCS  . Hyperlipidemia   . Hypertension     Past Surgical History  Procedure Laterality Date  . No past surgeries      There were no vitals filed for this visit.  Visit Diagnosis:  Left shoulder pain  Shoulder stiffness, left      Subjective Assessment - 05/10/15 0847    Subjective "ok, i aint got no pain right now"   Currently in Pain? No/denies   Pain Score 0-No pain                         [facility] Adult PT Treatment/Exercise - 05/10/15 0001    Shoulder Exercises: Standing   Other Standing Exercises Ball bounce on trampoline 2x25: body blade 30 sec arm by side    Other Standing Exercises Ball up wall 3x15; shrugs no weight x15     Shoulder Exercises: Pulleys   Flexion 2 minutes   ABduction 2 minutes   Other Pulley Exercises scaption x25   Other Pulley Exercises IR    Shoulder Exercises: ROM/Strengthening   UBE (Upper Arm Bike) L3 263frd/3rev   Modalities   Modalities Vasopneumatic   Cryotherapy   Number Minutes Cryotherapy 15 Minutes   Cryotherapy Location Shoulder   Type of Cryotherapy Ice pack   Electrical Stimulation   Electrical Stimulation Location left shoulder   Electrical Stimulation Action IFC   Electrical  Stimulation Goals Pain                  PT Short Term Goals - 04/24/15 1138    PT SHORT TERM GOAL #1   Title independent with initial HEP   Time 2   Period Weeks   Status New           PT Long Term Goals - 04/24/15 1138    PT LONG TERM GOAL #1   Title decrease pain 50%   Time 8   Period Weeks   Status New   PT LONG TERM GOAL #2   Title increase AROM of flexion to 140 degrees   Time 8   Period Weeks   Status New   PT LONG TERM GOAL #3   Title be able to dress without pain   Time 8   Period Weeks   Status New   PT LONG TERM GOAL #4   Title lift 10# overhead   Time 8   Period Weeks   Status New               Plan - 05/10/15 [id number]    Clinical Impression Statement Continues with AROM per orders.   Preforms all activities well does have some  pain during session. Pt repots that he return to MD Monday.   PT Next Visit Plan Get orders from MD        Problem List Patient Active Problem List   Diagnosis Date Noted  . PCP NOTES >>> 02/23/2015  . Pain in joint, shoulder region 09/10/2014  . Annual physical exam 01/15/2012  . Insomnia 11/25/2010  . DM II (diabetes mellitus, type II), controlled (HCC) 04/01/2010  . HYPERLIPIDEMIA 04/01/2010  . Essential hypertension 04/01/2010  . GERD 04/01/2010    Grayce Sessions, PTA  05/10/2015, 9:27 AM  [facility] [place] [address] [place], [place], [address] Phone: [phone]   Fax:  [phone]  Name: Bruce Henson MRN: [id number] Date of Birth: [birth year]

## 2015-05-21 ENCOUNTER — Ambulatory Visit: Payer: Managed Care, Other (non HMO) | Admitting: Physical Therapy

## 2015-05-21 ENCOUNTER — Encounter: Payer: Self-pay | Admitting: Physical Therapy

## 2015-05-21 DIAGNOSIS — M25512 Pain in left shoulder: Secondary | ICD-10-CM

## 2015-05-21 DIAGNOSIS — M25612 Stiffness of left shoulder, not elsewhere classified: Secondary | ICD-10-CM

## 2015-05-21 NOTE — Therapy (Signed)
[facility] [place] [address] [place], [place], [address] Phone: [phone]   Fax:  [phone]  Physical Therapy Treatment  Patient Details  Name: Bruce Henson MRN: [id number] Date of Birth: Sep 10, 1965 Referring Provider: Cleophas Dunker  Encounter Date: 05/21/2015      PT End of Session - 05/21/15 1559    PT Start Time 1519   PT Stop Time 1613   PT Time Calculation (min) 54 min      Past Medical History  Diagnosis Date  . Diabetes mellitus 2005  . GERD (gastroesophageal reflux disease)     mild, uses OTCS  . Hyperlipidemia   . Hypertension     Past Surgical History  Procedure Laterality Date  . No past surgeries      There were no vitals filed for this visit.  Visit Diagnosis:  Shoulder stiffness, left  Left shoulder pain      Subjective Assessment - 05/21/15 1518    Subjective "The doctor just sent me back here"   Currently in Pain? No/denies   Pain Score 0-No pain                         [facility] Adult PT Treatment/Exercise - 05/21/15 0001    Shoulder Exercises: Standing   Other Standing Exercises AAROM with cane OHP 3x10; Ext with cane 2x10;  Flex 3x10   Other Standing Exercises Standing towel stretch 2x10; bicep curls 3x10   Shoulder Exercises: ROM/Strengthening   UBE (Upper Arm Bike) L3 41frd/3rev   Modalities   Modalities Vasopneumatic   Vasopneumatic   Number Minutes Vasopneumatic  15 minutes   Vasopnuematic Location  Shoulder   Vasopneumatic Pressure Medium   Vasopneumatic Temperature  34   Manual Therapy   Manual Therapy Passive ROM   Manual therapy comments Painful at end range    Passive ROM R shoulder all directions                  PT Short Term Goals - 04/24/15 1138    PT SHORT TERM GOAL #1   Title independent with initial HEP   Time 2   Period Weeks   Status New           PT Long Term Goals - 04/24/15 1138    PT LONG TERM GOAL #1   Title  decrease pain 50%   Time 8   Period Weeks   Status New   PT LONG TERM GOAL #2   Title increase AROM of flexion to 140 degrees   Time 8   Period Weeks   Status New   PT LONG TERM GOAL #3   Title be able to dress without pain   Time 8   Period Weeks   Status New   PT LONG TERM GOAL #4   Title lift 10# overhead   Time 8   Period Weeks   Status New               Plan - 05/21/15 1559    Clinical Impression Statement Progressed pt to AAROM with L UE. Pt tolerated all interventions well. increase pain with IR towel stretch. Pt does reports that his L shoulder is stiff this date. Increase stiffness noted with MT   Pt will benefit from skilled therapeutic intervention in order to improve on the following deficits Decreased range of motion;Decreased strength;Increased muscle spasms;Impaired flexibility;Postural dysfunction;Pain   Rehab Potential Good  PT Frequency 2x / week   PT Duration 8 weeks   PT Treatment/Interventions Moist Heat;Electrical Stimulation;Cryotherapy;Functional mobility training;Therapeutic activities;Therapeutic exercise;Manual techniques;Vasopneumatic Device;Patient/family education;Passive range of motion   PT Next Visit Plan Get orders from MD        Problem List Patient Active Problem List   Diagnosis Date Noted  . PCP NOTES >>> 02/23/2015  . Pain in joint, shoulder region 09/10/2014  . Annual physical exam 01/15/2012  . Insomnia 11/25/2010  . DM II (diabetes mellitus, type II), controlled ([facility]) 04/01/2010  . HYPERLIPIDEMIA 04/01/2010  . Essential hypertension 04/01/2010  . GERD 04/01/2010    Grayce Sessions, PTA  05/21/2015, 4:03 PM  [facility] [place] [address] [place], [place], [address] Phone: [phone]   Fax:  [phone]  Name: NEEL BUFFONE MRN: [id number] Date of Birth: August 26, 1965

## 2015-05-23 ENCOUNTER — Ambulatory Visit: Payer: Managed Care, Other (non HMO) | Admitting: Physical Therapy

## 2015-05-28 ENCOUNTER — Encounter: Payer: Self-pay | Admitting: Physical Therapy

## 2015-05-28 ENCOUNTER — Ambulatory Visit: Payer: Managed Care, Other (non HMO) | Admitting: Physical Therapy

## 2015-05-28 DIAGNOSIS — M25612 Stiffness of left shoulder, not elsewhere classified: Secondary | ICD-10-CM | POA: Diagnosis not present

## 2015-05-28 DIAGNOSIS — M25512 Pain in left shoulder: Secondary | ICD-10-CM

## 2015-05-28 NOTE — Therapy (Signed)
[place], [place], [address] Phone: [phone]   Fax:  [phone]  Physical Therapy Treatment  Patient Details  Name: Bruce Henson MRN: [id number] Date of Birth: 1965/06/07 Referring Provider: Durward Fortes  Encounter Date: 05/28/2015      PT End of Session - 05/28/15 1558    Visit Number 6   Date for PT Re-Evaluation 06/25/15   PT Start Time 1516   PT Stop Time 1611   PT Time Calculation (min) 55 min   Activity Tolerance Patient tolerated treatment well   Behavior During Therapy [facility] for tasks assessed/performed      Past Medical History  Diagnosis Date  . Diabetes mellitus 2005  . GERD (gastroesophageal reflux disease)     mild, uses OTCS  . Hyperlipidemia   . Hypertension     Past Surgical History  Procedure Laterality Date  . No past surgeries      There were no vitals filed for this visit.  Visit Diagnosis:  Left shoulder pain  Shoulder stiffness, left      Subjective Assessment - 05/28/15 1518    Subjective "All right so far"   Currently in Pain? No/denies   Pain Score 0-No pain                         [facility] Adult PT Treatment/Exercise - 05/28/15 0001    Shoulder Exercises: Standing   External Rotation 10 reps;Theraband  2 sets    Theraband Level (Shoulder External Rotation) Level 1 (Yellow)   Internal Rotation Theraband;15 reps  2 sets    Theraband Level (Shoulder Internal Rotation) Level 2 (Red)   Flexion 10 reps;Weights  2 sets   Shoulder Flexion Weight (lbs) 1   ABduction 10 reps  2 sets    Shoulder ABduction Weight (lbs) 1   Extension 10 reps;Theraband  2 sets    Theraband Level (Shoulder Extension) Level 2 (Red)   Row 10 reps;Theraband  2 sets    Theraband Level (Shoulder Row) Level 2 (Red)   Other Standing Exercises Bicep curl #5 2x15    Shoulder Exercises: ROM/Strengthening   UBE (Upper Arm Bike) L3 19fd/3rev   Modalities   Modalities Vasopneumatic;Electrical Stimulation   Electrical Stimulation   Electrical Stimulation Location L shoulder    Electrical Stimulation Action IFC   Electrical Stimulation Goals Pain   Vasopneumatic   Number Minutes Vasopneumatic  15 minutes   Vasopnuematic Location  Shoulder   Vasopneumatic Pressure Medium   Vasopneumatic Temperature  34   Manual Therapy   Manual Therapy Passive ROM   Manual therapy comments Pain full at end range    Passive ROM R shoulder all directions                  PT Short Term Goals - 05/28/15 1601    PT SHORT TERM GOAL #1   Title independent with initial HEP   Status Achieved           PT Long Term Goals - 05/28/15 1601    PT LONG TERM GOAL #1   Title decrease pain 50%   Status Partially Met   PT LONG TERM GOAL #3   Title be able to dress without pain   Status Achieved               Plan - 05/28/15 1600    Clinical Impression Statement Received orders from MD  to start ROM and strengthening. Pt able to progress to Tband level resistance exercises without issue. Pt does demo L shoulder elevation with active abduction and flexion.    Pt will benefit from skilled therapeutic intervention in order to improve on the following deficits Decreased range of motion;Decreased strength;Increased muscle spasms;Impaired flexibility;Postural dysfunction;Pain   Rehab Potential Good   PT Frequency 2x / week   PT Duration 8 weeks   PT Treatment/Interventions Moist Heat;Electrical Stimulation;Cryotherapy;Functional mobility training;Therapeutic activities;Therapeutic exercise;Manual techniques;Vasopneumatic Device;Patient/family education;Passive range of motion   PT Next Visit Plan L shoulder strengthening and ROM        Problem List Patient Active Problem List   Diagnosis Date Noted  . PCP NOTES >>> 02/23/2015  . Pain in joint, shoulder region 09/10/2014  . Annual physical exam 01/15/2012  . Insomnia 11/25/2010  . DM II  (diabetes mellitus, type II), controlled ([place]) 04/01/2010  . HYPERLIPIDEMIA 04/01/2010  . Essential hypertension 04/01/2010  . GERD 04/01/2010    Scot Jun, PTA  05/28/2015, 4:02 PM  [place], [place], [address] Phone: [phone]   Fax:  [phone]  Name: RAEDYN WENKE MRN: [id number] Date of Birth: [birth year]

## 2015-05-30 ENCOUNTER — Encounter: Payer: Self-pay | Admitting: Physical Therapy

## 2015-05-30 ENCOUNTER — Ambulatory Visit: Payer: Managed Care, Other (non HMO) | Admitting: Physical Therapy

## 2015-05-30 DIAGNOSIS — M25612 Stiffness of left shoulder, not elsewhere classified: Secondary | ICD-10-CM | POA: Diagnosis not present

## 2015-05-30 DIAGNOSIS — M25512 Pain in left shoulder: Secondary | ICD-10-CM

## 2015-05-30 NOTE — Therapy (Addendum)
[place], [place], [address] Phone: [phone]   Fax:  [phone]  Physical Therapy Treatment  Patient Details  Name: Bruce Henson MRN: [id number] Date of Birth: [birth year] Referring Provider: Durward Fortes  Encounter Date: 05/30/2015      PT End of Session - 05/30/15 1604    Visit Number 7   Date for PT Re-Evaluation 06/25/15   PT Start Time 1515   PT Stop Time 1618   PT Time Calculation (min) 63 min   Activity Tolerance Patient tolerated treatment well   Behavior During Therapy [facility] for tasks assessed/performed      Past Medical History  Diagnosis Date  . Diabetes mellitus 2005  . GERD (gastroesophageal reflux disease)     mild, uses OTCS  . Hyperlipidemia   . Hypertension     Past Surgical History  Procedure Laterality Date  . No past surgeries      There were no vitals filed for this visit.  Visit Diagnosis:  Shoulder stiffness, left  Left shoulder pain      Subjective Assessment - 05/30/15 1518    Subjective "It was all right, wont in too much pain when I left here"   Currently in Pain? No/denies   Pain Score 0-No pain            [facility] PT Assessment - 05/30/15 0001    AROM   Left Shoulder Flexion 120 Degrees   Left Shoulder ABduction 78 Degrees   Left Shoulder Internal Rotation 90 Degrees   Left Shoulder External Rotation 30 Degrees                     [facility] Adult PT Treatment/Exercise - 05/30/15 0001    Shoulder Exercises: Standing   External Rotation Left;20 reps;Theraband   Theraband Level (Shoulder External Rotation) Level 2 (Red)   Internal Rotation Both;Theraband;20 reps   Theraband Level (Shoulder Internal Rotation) Level 2 (Red)   Shoulder Exercises: ROM/Strengthening   UBE (Upper Arm Bike) L3 36fd/3rev   Shoulder Exercises: Power TDevelopment worker, community15 reps  2 sets    Extension Limitations 20   Row 10 reps  #25 2 sets    Other  Power TUnumProvidentExercises Lat pull down #25 2x10   Other Power TUnumProvidentExercises Chest press #20 2x10; standing rev grip rows #20 2x15     ETheme park managerL shoulder    EChartered certified accountantIFC   Electrical Stimulation Goals Pain   Vasopneumatic   Number Minutes Vasopneumatic  15 minutes   Vasopnuematic Location  Shoulder   Vasopneumatic Pressure Medium   Vasopneumatic Temperature  34   Manual Therapy   Manual Therapy Passive ROM   Manual therapy comments Pain full at end range    Passive ROM R shoulder all directions                  PT Short Term Goals - 05/28/15 1601    PT SHORT TERM GOAL #1   Title independent with initial HEP   Status Achieved           PT Long Term Goals - 05/28/15 1601    PT LONG TERM GOAL #1   Title decrease pain 50%   Status Partially Met   PT LONG TERM GOAL #3   Title be able to dress without pain   Status Achieved  Plan - 05/30/15 1606    Clinical Impression Statement Pt tolerated machine level interventions with light weight this date. Reports no pain during exercises. Pt does have pain at end range with PROM. Pt as also progressed towards some goals increasing L shoulder AROM.   Pt will benefit from skilled therapeutic intervention in order to improve on the following deficits Decreased range of motion;Decreased strength;Increased muscle spasms;Impaired flexibility;Postural dysfunction;Pain   Rehab Potential Good   PT Frequency 2x / week   PT Duration 8 weeks   PT Treatment/Interventions Moist Heat;Electrical Stimulation;Cryotherapy;Functional mobility training;Therapeutic activities;Therapeutic exercise;Manual techniques;Vasopneumatic Device;Patient/family education;Passive range of motion   PT Next Visit Plan L shoulder strengthening and ROM        Problem List Patient Active Problem List   Diagnosis Date Noted  . PCP NOTES >>> 02/23/2015  . Pain in joint,  shoulder region 09/10/2014  . Annual physical exam 01/15/2012  . Insomnia 11/25/2010  . DM II (diabetes mellitus, type II), controlled ([place]) 04/01/2010  . HYPERLIPIDEMIA 04/01/2010  . Essential hypertension 04/01/2010  . GERD 04/01/2010    PHYSICAL THERAPY DISCHARGE SUMMARY  Visits from Start of Care: 7  Plan: Patient agrees to discharge.  Patient goals were partially met. Patient is being discharged due to being pleased with the current functional level.  ?????       Scot Jun, PTA  05/30/2015, 4:11 PM  [place], [place], [address] Phone: [phone]   Fax:  [phone]  Name: Bruce Henson MRN: [id number] Date of Birth: 03-Mar-1966

## 2015-06-04 ENCOUNTER — Ambulatory Visit: Payer: Managed Care, Other (non HMO) | Admitting: Physical Therapy

## 2015-06-06 ENCOUNTER — Ambulatory Visit: Payer: Managed Care, Other (non HMO) | Attending: Orthopaedic Surgery | Admitting: Physical Therapy

## 2015-06-09 ENCOUNTER — Other Ambulatory Visit: Payer: Self-pay | Admitting: Internal Medicine

## 2015-06-10 NOTE — Telephone Encounter (Signed)
Rx faxed to [facility] pharmacy.  

## 2015-06-10 NOTE — Telephone Encounter (Signed)
Ok 30 and 1 RF 

## 2015-06-10 NOTE — Telephone Encounter (Signed)
Rx printed, awaiting MD signature.  

## 2015-06-10 NOTE — Telephone Encounter (Signed)
Pt is requesting refill on Ambien.  Last OV: 02/22/2015 Last Fill: 03/26/2014 #15 and 0RF UDS: Not needed  Please advise.

## 2015-06-25 ENCOUNTER — Encounter: Payer: Self-pay | Admitting: Internal Medicine

## 2015-06-25 ENCOUNTER — Ambulatory Visit (INDEPENDENT_AMBULATORY_CARE_PROVIDER_SITE_OTHER): Payer: Managed Care, Other (non HMO) | Admitting: Internal Medicine

## 2015-06-25 VITALS — BP 120/84 | HR 100 | Temp 99.0°F | Ht 73.0 in | Wt 270.0 lb

## 2015-06-25 DIAGNOSIS — R52 Pain, unspecified: Secondary | ICD-10-CM | POA: Diagnosis not present

## 2015-06-25 DIAGNOSIS — Z09 Encounter for follow-up examination after completed treatment for conditions other than malignant neoplasm: Secondary | ICD-10-CM

## 2015-06-25 DIAGNOSIS — R05 Cough: Secondary | ICD-10-CM

## 2015-06-25 DIAGNOSIS — R059 Cough, unspecified: Secondary | ICD-10-CM

## 2015-06-25 DIAGNOSIS — B349 Viral infection, unspecified: Secondary | ICD-10-CM

## 2015-06-25 LAB — POCT INFLUENZA A/B

## 2015-06-25 LAB — POCT RAPID STREP A (OFFICE): Rapid Strep A Screen: POSITIVE — AB

## 2015-06-25 MED ORDER — HYDROCODONE-HOMATROPINE 5-1.5 MG/5ML PO SYRP: 5.0000 mL | ORAL_SOLUTION | Freq: Every evening | ORAL | Status: DC | PRN

## 2015-06-25 MED ORDER — AMOXICILLIN 875 MG PO TABS: 875.0000 mg | ORAL_TABLET | Freq: Two times a day (BID) | ORAL | Status: DC

## 2015-06-25 MED ORDER — AZITHROMYCIN 250 MG PO TABS: ORAL_TABLET | ORAL | Status: DC

## 2015-06-25 NOTE — Patient Instructions (Addendum)
Rest, fluids , tylenol  For cough:  Take Mucinex DM twice a day as needed until better Take hydrocodone at night for severe cough, medicine will cause drowsiness   For nasal congestion: Use OTC Nasocort or Flonase : 2 nasal sprays on each side of the nose in the morning until you feel better   Avoid decongestants such as  Pseudoephedrine or phenylephrine    Take the antibiotic as prescribed  (zithromax ) only if no better in few days   Call if not gradually better over the next  10 days  Call anytime if the symptoms are severe, you have high fever, short of breath, chest pain   PLEASE SCHEDULE A ROUTINE VISIT

## 2015-06-25 NOTE — Progress Notes (Signed)
Subjective:    Patient ID: Bruce Henson, male    DOB: 06/01/1965, 50 y.o.   MRN: [id number]  DOS:  06/25/2015 Type of visit - description : Acute visit  Interval history: Symptoms started 4 days ago without itchy sore throat, cough, chills. The next day he had a lot of aches and pains and he "couldn't move". Today for the first time he feels is slightly better.    Review of Systems   + Subjective fever. Mild sinus pain and congestion No nausea vomiting + Sputum, white No HA or Rash  Past Medical History  Diagnosis Date  . Diabetes mellitus 2005  . GERD (gastroesophageal reflux disease)     mild, uses OTCS  . Hyperlipidemia   . Hypertension     Past Surgical History  Procedure Laterality Date  . No past surgeries      Social History   Social History  . Marital Status: Single    Spouse Name: N/A  . Number of Children: 2  . Years of Education: N/A   Occupational History  . Heavy Arboriculturist    Social History Main Topics  . Smoking status: Never Smoker   . Smokeless tobacco: Never Used  . Alcohol Use: Yes     Comment: socially  . Drug Use: No  . Sexual Activity: Not on file   Other Topics Concern  . Not on file   Social History Narrative   Divorced, remarried, 2 children                    Medication List       This list is accurate as of: 06/25/15 11:59 PM.  Always use your most recent med list.               amoxicillin 875 MG tablet  Commonly known as:  AMOXIL  Take 1 tablet (875 mg total) by mouth 2 (two) times daily.     hydrochlorothiazide 25 MG tablet  Commonly known as:  HYDRODIURIL  Take 1 tablet (25 mg total) by mouth daily.     HYDROcodone-homatropine 5-1.5 MG/5ML syrup  Commonly known as:  HYCODAN  Take 5 mLs by mouth at bedtime as needed for cough.     ibuprofen 200 MG tablet  Commonly known as:  ADVIL,MOTRIN  Take 600 mg by mouth every 6 (six) hours as needed for moderate pain.     metFORMIN 1000 MG tablet    Commonly known as:  GLUCOPHAGE  Take 1 tablet (1,000 mg total) by mouth 2 (two) times daily with a meal.     metoprolol succinate 25 MG 24 hr tablet  Commonly known as:  TOPROL-XL  Take 1 tablet (25 mg total) by mouth daily.     pioglitazone 30 MG tablet  Commonly known as:  ACTOS  Take 1 tablet (30 mg total) by mouth daily.     pravastatin 20 MG tablet  Commonly known as:  PRAVACHOL  Take 1 tablet (20 mg total) by mouth at bedtime.     ranitidine 300 MG tablet  Commonly known as:  ZANTAC  Take 1 tablet (300 mg total) by mouth at bedtime.     sildenafil 20 MG tablet  Commonly known as:  REVATIO  Take 2-3 tablets (40-60 mg total) by mouth daily as needed.     sitaGLIPtin 100 MG tablet  Commonly known as:  JANUVIA  Take 1 tablet (100 mg total) by mouth daily.  zolpidem 10 MG tablet  Commonly known as:  AMBIEN  Take 1 tablet (10 mg total) by mouth at bedtime as needed for sleep.           Objective:   Physical Exam BP 120/84 mmHg  Pulse 100  Temp(Src) 99 F (37.2 C) (Oral)  Ht  (1.854 m)  Wt 270 lb (122.471 kg)  BMI 35.63 kg/m2  SpO2 95% General:   Well developed, well nourished . NAD.  HEENT:  Normocephalic . Face symmetric, atraumatic. TMs: Slightly bulge but no red. Nose quite congested, sinuses not the DP. Throat is slightly red without discharge Lungs:  Rhonchi with cough (few) Normal respiratory effort, no intercostal retractions, no accessory muscle use. Heart: RRR,  no murmur.  No pretibial edema bilaterally  Skin: Not pale. Not jaundice Neurologic:  alert & oriented X3.  Speech normal, gait appropriate for age and unassisted Psych--  Cognition and judgment appear intact.  Cooperative with normal attention span and concentration.  Behavior appropriate. No anxious or depressed appearing.      Assessment & Plan:   Assessment>  DM 2005 HTN Hyperlipidemia GERD   PLAN: Viral syndrome: Symptoms started 4 days ago, we are checking a  strep test but symptoms likely due to a virus. Influenza?. Plan: Conservative treatment, if not improving in few days start a Z-Pak. See instructions. Addendum: strep test +. Change zpack to amoxicillin. Pt agreed   Also, due for a routine visit, encouraged to make an appointment

## 2015-06-25 NOTE — Progress Notes (Signed)
Pre visit review using our clinic review tool, if applicable. No additional management support is needed unless otherwise documented below in the visit note. 

## 2015-06-26 NOTE — Assessment & Plan Note (Signed)
Viral syndrome: Symptoms started 4 days ago, we are checking a strep test but symptoms likely due to a virus. Influenza?. Plan: Conservative treatment, if not improving in few days start a Z-Pak. See instructions. Addendum: strep test +. Change zpack to amoxicillin. Pt agreed   Also, due for a routine visit, encouraged to make an appointment

## 2015-09-02 ENCOUNTER — Other Ambulatory Visit: Payer: Self-pay | Admitting: Internal Medicine

## 2015-09-03 NOTE — Telephone Encounter (Signed)
Medication filled to pharmacy as requested.   

## 2015-09-16 ENCOUNTER — Ambulatory Visit (INDEPENDENT_AMBULATORY_CARE_PROVIDER_SITE_OTHER): Payer: Managed Care, Other (non HMO) | Admitting: Internal Medicine

## 2015-09-16 ENCOUNTER — Encounter: Payer: Self-pay | Admitting: Internal Medicine

## 2015-09-16 VITALS — BP 124/78 | HR 83 | Temp 98.1°F | Ht 73.0 in | Wt 273.4 lb

## 2015-09-16 DIAGNOSIS — E785 Hyperlipidemia, unspecified: Secondary | ICD-10-CM | POA: Diagnosis not present

## 2015-09-16 DIAGNOSIS — Z23 Encounter for immunization: Secondary | ICD-10-CM | POA: Diagnosis not present

## 2015-09-16 DIAGNOSIS — Z09 Encounter for follow-up examination after completed treatment for conditions other than malignant neoplasm: Secondary | ICD-10-CM

## 2015-09-16 DIAGNOSIS — E118 Type 2 diabetes mellitus with unspecified complications: Secondary | ICD-10-CM | POA: Diagnosis not present

## 2015-09-16 DIAGNOSIS — I10 Essential (primary) hypertension: Secondary | ICD-10-CM

## 2015-09-16 LAB — CBC WITH DIFFERENTIAL/PLATELET
BASOS PCT: 0.3 % (ref 0.0–3.0)
Basophils Absolute: 0 10*3/uL (ref 0.0–0.1)
EOS PCT: 2.9 % (ref 0.0–5.0)
Eosinophils Absolute: 0.2 10*3/uL (ref 0.0–0.7)
HEMATOCRIT: 41.7 % (ref 39.0–52.0)
HEMOGLOBIN: 13.5 g/dL (ref 13.0–17.0)
LYMPHS PCT: 30.4 % (ref 12.0–46.0)
Lymphs Abs: 2.1 10*3/uL (ref 0.7–4.0)
MCHC: 32.4 g/dL (ref 30.0–36.0)
MCV: 87.3 fl (ref 78.0–100.0)
Monocytes Absolute: 0.5 10*3/uL (ref 0.1–1.0)
Monocytes Relative: 7.8 % (ref 3.0–12.0)
Neutro Abs: 4 10*3/uL (ref 1.4–7.7)
Neutrophils Relative %: 58.6 % (ref 43.0–77.0)
Platelets: 204 10*3/uL (ref 150.0–400.0)
RBC: 4.77 Mil/uL (ref 4.22–5.81)
RDW: 13.5 % (ref 11.5–15.5)
WBC: 6.9 10*3/uL (ref 4.0–10.5)

## 2015-09-16 LAB — BASIC METABOLIC PANEL
BUN: 11 mg/dL (ref 6–23)
CALCIUM: 9.2 mg/dL (ref 8.4–10.5)
CO2: 30 meq/L (ref 19–32)
Chloride: 103 mEq/L (ref 96–112)
Creatinine, Ser: 0.83 mg/dL (ref 0.40–1.50)
GFR: 126.31 mL/min (ref >60.00)
Glucose, Bld: 124 mg/dL — ABNORMAL HIGH (ref 70–99)
Potassium: 3.7 mEq/L (ref 3.5–5.1)
SODIUM: 141 meq/L (ref 135–145)

## 2015-09-16 LAB — AST: AST: 15 U/L (ref 0–37)

## 2015-09-16 LAB — LIPID PANEL
CHOLESTEROL: 154 mg/dL (ref 0–200)
HDL: 47.1 mg/dL (ref >39.00)
LDL Cholesterol: 82 mg/dL (ref 0–99)
NonHDL: 106.46
Total CHOL/HDL Ratio: 3
Triglycerides: 121 mg/dL (ref 0.0–149.0)
VLDL: 24.2 mg/dL (ref 0.0–40.0)

## 2015-09-16 LAB — HEMOGLOBIN A1C: Hgb A1c MFr Bld: 6.6 % — ABNORMAL HIGH (ref 4.6–6.5)

## 2015-09-16 LAB — ALT: ALT: 27 U/L (ref 0–53)

## 2015-09-16 MED ORDER — SILDENAFIL CITRATE 100 MG PO TABS: 50.0000 mg | ORAL_TABLET | Freq: Every day | ORAL | Status: DC | PRN

## 2015-09-16 NOTE — Progress Notes (Signed)
Pre visit review using our clinic review tool, if applicable. No additional management support is needed unless otherwise documented below in the visit note. 

## 2015-09-16 NOTE — Progress Notes (Signed)
Subjective:    Patient ID: Bruce Henson, male    DOB: [birth year], 50 y.o.   MRN: [id number]  DOS:  09/16/2015 Type of visit - description : Routine visit Interval history: Diabetes: Good compliance of medication, no ambulatory CBGs HTN: Good med compliance, no ambulatory BPs High cholesterol: On statins, overdue for an FLP. Had his eye exam today, normal.   Review of Systems Denies fever, chills. No chest pain, difficulty breathing No nausea, vomiting, diarrhea Occasionally mild pain at both knees, mostly when he goes upstairs  Past Medical History  Diagnosis Date  . Diabetes mellitus 2005  . GERD (gastroesophageal reflux disease)     mild, uses OTCS  . Hyperlipidemia   . Hypertension     Past Surgical History  Procedure Laterality Date  . No past surgeries      Social History   Social History  . Marital Status: Single    Spouse Name: N/A  . Number of Children: 2  . Years of Education: N/A   Occupational History  . Heavy Arboriculturistquipment Operator    Social History Main Topics  . Smoking status: Never Smoker   . Smokeless tobacco: Never Used  . Alcohol Use: Yes     Comment: socially  . Drug Use: No  . Sexual Activity: Not on file   Other Topics Concern  . Not on file   Social History Narrative   Divorced, remarried, lives w/ wife        Medication List       This list is accurate as of: 09/16/15 11:59 PM.  Always use your most recent med list.               aspirin 81 MG tablet  Take 81 mg by mouth daily.     hydrochlorothiazide 25 MG tablet  Commonly known as:  HYDRODIURIL  TAKE 1 TABLET DAILY     ibuprofen 200 MG tablet  Commonly known as:  ADVIL,MOTRIN  Take 600 mg by mouth every 6 (six) hours as needed for moderate pain.     metFORMIN 1000 MG tablet  Commonly known as:  GLUCOPHAGE  TAKE 1 TABLET TWICE DAILY WITH MEALS     metoprolol succinate 25 MG 24 hr tablet  Commonly known as:  TOPROL-XL  TAKE 1 TABLET DAILY     pioglitazone  30 MG tablet  Commonly known as:  ACTOS  Take 1 tablet (30 mg total) by mouth daily.     pravastatin 20 MG tablet  Commonly known as:  PRAVACHOL  Take 1 tablet (20 mg total) by mouth at bedtime.     ranitidine 300 MG tablet  Commonly known as:  ZANTAC  Take 1 tablet (300 mg total) by mouth at bedtime.     sildenafil 100 MG tablet  Commonly known as:  VIAGRA  Take 0.5-1 tablets (50-100 mg total) by mouth daily as needed for erectile dysfunction.     sitaGLIPtin 100 MG tablet  Commonly known as:  JANUVIA  Take 1 tablet (100 mg total) by mouth daily.     zolpidem 10 MG tablet  Commonly known as:  AMBIEN  Take 1 tablet (10 mg total) by mouth at bedtime as needed for sleep.           Objective:   Physical Exam BP 124/78 mmHg  Pulse 83  Temp(Src) 98.1 F (36.7 C) (Oral)  Ht 6\' 1"  (1.854 m)  Wt 273 lb 6 oz (124.002 kg)  BMI 36.08  kg/m2  SpO2 96% General:   Well developed, well nourished . NAD.  HEENT:  Normocephalic . Face symmetric, atraumatic Lungs:  CTA B Normal respiratory effort, no intercostal retractions, no accessory muscle use. Heart: RRR,  no murmur.  No pretibial edema bilaterally  MSK: Knees without deformities, swelling or effusion.   Skin: Not pale. Not jaundice Neurologic:  alert & oriented X3.  Speech normal, gait appropriate for age and unassisted Psych--  Cognition and judgment appear intact.  Cooperative with normal attention span and concentration.  Behavior appropriate. No anxious or depressed appearing.    Assessment & Plan:   Assessment>  DM 2005 HTN Hyperlipidemia GERD   PLAN: DM: Continue metformin, Actos, Januvia. Check a A1c. Doing well w/ diet and exercise, had a (-)  eye check today HTN: No ambulatory BPs, BP today is very good, continue Toprol, hydrochlorothiazide. Check a BMP and CBC High cholesterol: On Pravachol, check labs. ED: Request a prescription for Viagra, a prescription provided. Primary care: PNM 23 shot today,  start ASA , GI precautions discussed RTC 4-5 months  CPX

## 2015-09-16 NOTE — Patient Instructions (Signed)
GO TO THE LAB : Get the blood work     GO TO THE FRONT DESK Schedule your next appointment for a  Physical exam in 4-5 months, fasting   Aspirin 81 mg: 1 a day

## 2015-09-17 NOTE — Assessment & Plan Note (Signed)
DM: Continue metformin, Actos, Januvia. Check a A1c. Doing well w/ diet and exercise, had a (-)  eye check today HTN: No ambulatory BPs, BP today is very good, continue Toprol, hydrochlorothiazide. Check a BMP and CBC High cholesterol: On Pravachol, check labs. ED: Request a prescription for Viagra, a prescription provided. Primary care: PNM 23 shot today, start ASA , GI precautions discussed RTC 4-5 months  CPX

## 2016-02-18 ENCOUNTER — Ambulatory Visit (INDEPENDENT_AMBULATORY_CARE_PROVIDER_SITE_OTHER): Payer: Managed Care, Other (non HMO) | Admitting: Orthopaedic Surgery

## 2016-02-18 ENCOUNTER — Other Ambulatory Visit (INDEPENDENT_AMBULATORY_CARE_PROVIDER_SITE_OTHER): Payer: Self-pay | Admitting: Orthopaedic Surgery

## 2016-02-18 DIAGNOSIS — M25561 Pain in right knee: Secondary | ICD-10-CM | POA: Diagnosis not present

## 2016-02-19 LAB — PROTEIN, SYNOVIAL FLUID: PROTEIN, SYNOVIAL FLUID: 3.5 g/dL — AB (ref 1.0–3.0)

## 2016-02-19 LAB — SYNOVIAL FLUID, CRYSTAL

## 2016-02-22 ENCOUNTER — Other Ambulatory Visit: Payer: Self-pay | Admitting: Internal Medicine

## 2016-02-24 ENCOUNTER — Telehealth: Payer: Self-pay | Admitting: Orthopaedic Surgery

## 2016-02-24 NOTE — Telephone Encounter (Signed)
Please choose opt. 2 when calling [facility] back. They have questions about lab orders.  Ref # [id number]

## 2016-02-25 NOTE — Telephone Encounter (Signed)
Phelbotomist LaTonya spoke with [name] regarding the pt and the swab stick sent.

## 2016-03-03 ENCOUNTER — Ambulatory Visit (INDEPENDENT_AMBULATORY_CARE_PROVIDER_SITE_OTHER): Payer: Managed Care, Other (non HMO) | Admitting: Orthopedic Surgery

## 2016-05-12 ENCOUNTER — Telehealth (INDEPENDENT_AMBULATORY_CARE_PROVIDER_SITE_OTHER): Payer: Self-pay | Admitting: Orthopaedic Surgery

## 2016-05-12 ENCOUNTER — Other Ambulatory Visit (INDEPENDENT_AMBULATORY_CARE_PROVIDER_SITE_OTHER): Payer: Self-pay

## 2016-05-12 DIAGNOSIS — G8929 Other chronic pain: Secondary | ICD-10-CM

## 2016-05-12 DIAGNOSIS — M25561 Pain in right knee: Principal | ICD-10-CM

## 2016-05-12 NOTE — Telephone Encounter (Signed)
Sent MRI referral for Right knee MRI per dictation on 02/18/16

## 2016-05-12 NOTE — Telephone Encounter (Signed)
Patient would like to proceed with an MRI for his knee. He states he was told at his last appt that this would be the next step. Please advise.

## 2016-05-17 ENCOUNTER — Ambulatory Visit
Admission: RE | Admit: 2016-05-17 | Discharge: 2016-05-17 | Disposition: A | Discharge status: Home / Self Care | Payer: Managed Care, Other (non HMO) | Source: Ambulatory Visit | Attending: Orthopaedic Surgery | Admitting: Orthopaedic Surgery

## 2016-05-17 DIAGNOSIS — G8929 Other chronic pain: Secondary | ICD-10-CM

## 2016-05-17 DIAGNOSIS — M25561 Pain in right knee: Principal | ICD-10-CM

## 2016-05-21 ENCOUNTER — Ambulatory Visit (INDEPENDENT_AMBULATORY_CARE_PROVIDER_SITE_OTHER): Payer: Managed Care, Other (non HMO) | Admitting: Orthopaedic Surgery

## 2016-06-08 ENCOUNTER — Encounter (INDEPENDENT_AMBULATORY_CARE_PROVIDER_SITE_OTHER): Payer: Self-pay | Admitting: Orthopaedic Surgery

## 2016-06-08 ENCOUNTER — Ambulatory Visit (INDEPENDENT_AMBULATORY_CARE_PROVIDER_SITE_OTHER): Payer: Managed Care, Other (non HMO) | Admitting: Orthopaedic Surgery

## 2016-06-08 VITALS — BP 138/81 | HR 81 | Resp 16 | Ht 73.0 in | Wt 270.0 lb

## 2016-06-08 DIAGNOSIS — G8929 Other chronic pain: Secondary | ICD-10-CM | POA: Diagnosis not present

## 2016-06-08 DIAGNOSIS — M25561 Pain in right knee: Secondary | ICD-10-CM

## 2016-06-08 NOTE — Progress Notes (Signed)
Office Visit Note   Patient: Bruce Henson           Date of Birth: 1965-11-04           MRN: [id number] Visit Date: 06/08/2016              Requested by: Wanda Plump, MD [address] [place], [place] [address] PCP: Willow Ora, MD   Assessment & Plan: Visit Diagnosis: Bruce Henson has chronic right knee pain it seems to be a combination of osteoarthritis, synovitis and possible loose chondral fragment. Given his relatively young age I think it may be worthwhile to consider an arthroscopic debridement. He's tried NSAIDs without much relief. He has already had his knee aspirated. He has reached a point where the swelling and pain is a compromise of his activities. He is on his feet over the course of the day. Long discussion regarding the pluses and minuses of knee arthroscopy. He has failed conservative treatment and I think it's worth the arthroscopy. He is aware that it may not provide much relief given the significance of arthritis. All questions were answered. He is aware that he may be out of work for a while and he like to proceed. Orders:  No orders of the defined types were placed in this encounter.  No orders of the defined types were placed in this encounter.     Procedures: No procedures performed No crystals identified on knee aspirate in October  Clinical Data: No additional findings. MRI scan of the right knee was performed 05/17/2016. 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 There was mild marginal spurring laterally. There was a very large knee joint effusion with synovitis. There was a question as to whether or not he had a 1.1 x 1.2 x 0.2 cm chondral fragment. There was a very small Baker's cyst  Subjective: No chief complaint on file.   Bruce Henson is here today for results from his MRI of Right knee. He states he has to put all weight on  Left knee and now that is starting to hurt also.  Ibuprofen prn provides no relief Pt having trouble sleeping with the pain that is "throbbing."    Review of Systems   Objective: Vital Signs: There were no vitals taken for this visit.  Physical Exam  Ortho Exam exam of right knee reveals a large effusion. Knee was minimally warm. Mostly medial joint pain but was some patella pain with compression. Full extension flex to probably 105. No instability. No popliteal mass or pain. No calf discomfort. Neurovascular exam intact distally.  Specialty Comments:  No specialty comments available.  Imaging: No results found.   PMFS History: Patient Active Problem List   Diagnosis Date Noted  . PCP NOTES >>>>>>>>>>>>>>>>>>>>>>>>>>>>>> 02/23/2015  . Pain in joint, shoulder region 09/10/2014  . Annual physical exam 01/15/2012  . Insomnia 11/25/2010  . DM II (diabetes mellitus, type II), controlled (HCC) 04/01/2010  . HYPERLIPIDEMIA 04/01/2010  . Essential hypertension 04/01/2010  . GERD 04/01/2010   Past Medical History:  Diagnosis Date  . Diabetes mellitus 2005  . GERD (gastroesophageal reflux disease)    mild, uses OTCS  . Hyperlipidemia   . Hypertension     Family History  Problem Relation Age of Onset  . Heart disease Father     died at age 84  . Stroke Father   .  Diabetes Father   . Hypertension Father   . Colon cancer Neg Hx   . Prostate cancer Neg Hx     Past Surgical History:  Procedure Laterality Date  . NO PAST SURGERIES     Social History   Occupational History  . Heavy Arboriculturistquipment Operator    Social History Main Topics  . Smoking status: Never Smoker  . Smokeless tobacco: Never Used  . Alcohol use Yes     Comment: socially  . Drug use: No  . Sexual activity: Not on file

## 2016-06-09 ENCOUNTER — Telehealth (INDEPENDENT_AMBULATORY_CARE_PROVIDER_SITE_OTHER): Payer: Self-pay | Admitting: Orthopaedic Surgery

## 2016-06-09 NOTE — Telephone Encounter (Signed)
While scheduling surgery with pt, he requested a note for work stating that he is having surgery on 07/09/16 and how long he will be out of work. Please call pt when note is ready for pick up.

## 2016-06-09 NOTE — Telephone Encounter (Signed)
LVM for pt to call and schedule surgery. Will try pt again at a later time. 

## 2016-06-10 NOTE — Telephone Encounter (Signed)
Please give pt note stating he could be out of work 6-8 weeks

## 2016-06-15 ENCOUNTER — Telehealth (INDEPENDENT_AMBULATORY_CARE_PROVIDER_SITE_OTHER): Payer: Self-pay | Admitting: Orthopaedic Surgery

## 2016-06-15 NOTE — Telephone Encounter (Signed)
Patient said he spoke with Taren last week and he needs a note stating how long he will actually be out of work after surgery to provide to his employer. He needs to provide them with the note as soon as possible please. Please advise.

## 2016-06-16 NOTE — Telephone Encounter (Signed)
Please advise and I will call.

## 2016-06-16 NOTE — Telephone Encounter (Signed)
Minimum 4 weeks

## 2016-06-18 NOTE — Telephone Encounter (Signed)
LVMOM

## 2016-06-19 ENCOUNTER — Encounter (INDEPENDENT_AMBULATORY_CARE_PROVIDER_SITE_OTHER): Payer: Self-pay

## 2016-06-30 ENCOUNTER — Encounter (INDEPENDENT_AMBULATORY_CARE_PROVIDER_SITE_OTHER): Payer: Self-pay | Admitting: Orthopedic Surgery

## 2016-06-30 ENCOUNTER — Ambulatory Visit (INDEPENDENT_AMBULATORY_CARE_PROVIDER_SITE_OTHER): Payer: Managed Care, Other (non HMO) | Admitting: Orthopedic Surgery

## 2016-06-30 DIAGNOSIS — M25461 Effusion, right knee: Secondary | ICD-10-CM

## 2016-06-30 DIAGNOSIS — M25562 Pain in left knee: Secondary | ICD-10-CM | POA: Insufficient documentation

## 2016-06-30 DIAGNOSIS — M25561 Pain in right knee: Secondary | ICD-10-CM | POA: Diagnosis not present

## 2016-06-30 DIAGNOSIS — M25462 Effusion, left knee: Secondary | ICD-10-CM | POA: Diagnosis not present

## 2016-06-30 MED ORDER — BUPIVACAINE HCL 0.25 % IJ SOLN
4.0000 mL | INTRAMUSCULAR | Status: AC | PRN
  Administered 2016-06-30: 4 mL via INTRA_ARTICULAR

## 2016-06-30 MED ORDER — LIDOCAINE HCL 1 % IJ SOLN
5.0000 mL | INTRAMUSCULAR | Status: AC | PRN
  Administered 2016-06-30: 5 mL

## 2016-06-30 MED ORDER — METHYLPREDNISOLONE ACETATE 40 MG/ML IJ SUSP
40.0000 mg | INTRAMUSCULAR | Status: AC | PRN
  Administered 2016-06-30: 40 mg via INTRA_ARTICULAR

## 2016-06-30 NOTE — Progress Notes (Signed)
Office Visit Note   Patient: Bruce Henson           Date of Birth: 12-12-1965           MRN: [id number] Visit Date: 06/30/2016 Requested by: Wanda PlumpJose E Paz, MD [address] [place], [place] [id number] PCP: Willow OraJose Paz, MD  Subjective: Chief Complaint  Patient presents with  . Left Knee - Pain, Edema    HPI Bruce SalinaBenjamin 51 year old patient with left and right knee pain.  He scheduled for right knee arthroscopy next week with Dr. Cleophas DunkerWhitfield.  He reports bilateral knee pain and swelling.  Does have a remote history of some type of surgery when he was very young on that left knee.  He works at [employer] which involves a lot of up-and-down climbing.  He denies any fever or chills or history of gout or pseudogout.              Review of Systems All systems reviewed are negative as they relate to the chief complaint within the history of present illness.  Patient denies  fevers or chills.    Assessment & Plan: Visit Diagnoses:  1. Swelling of left knee joint   2. Acute pain of left knee     Plan: Impression is bilateral knee effusion and likely degenerative changes present.  Right knee arthroscopy as planned.  That'll be going on that week with Dr. Cleophas DunkerWhitfield.  I did aspirate about 75 mL out of the right knee serous with no injection performed.  On the left knee I aspirated about 55 mL and did inject cortisone.  He will follow-up with Dr. Cleophas DunkerWhitfield for surgery in the near future.  He may need left knee MRI scan and workup.  No evidence of infection in the left knee based on aspiration today.  Follow-Up Instructions: Return if symptoms worsen or fail to improve.   Orders:  No orders of the defined types were placed in this encounter.  No orders of the defined types were placed in this encounter.     Procedures: Large Joint Inj Date/Time: 06/30/2016 5:01 PM Performed by: Cammy CopaEAN, SCOTT GREGORY Authorized by: Cammy CopaEAN, SCOTT GREGORY   Consent Given by:  Patient Site marked: the  procedure site was marked   Timeout: prior to procedure the correct patient, procedure, and site was verified   Indications:  Pain, joint swelling and diagnostic evaluation Location:  Knee Site:  R knee Prep: patient was prepped and draped in usual sterile fashion   Needle Size:  18 G Needle Length:  1.5 inches Approach:  Superolateral Ultrasound Guidance: No   Fluoroscopic Guidance: No   Arthrogram: No   Medications:  5 mL lidocaine 1 % Aspiration Attempted: Yes   Aspirate amount (mL):  60 Aspirate:  Yellow Patient tolerance:  Patient tolerated the procedure well with no immediate complications Large Joint Inj Date/Time: 06/30/2016 5:02 PM Performed by: Cammy CopaEAN, SCOTT GREGORY Authorized by: Cammy CopaEAN, SCOTT GREGORY   Consent Given by:  Patient Site marked: the procedure site was marked   Timeout: prior to procedure the correct patient, procedure, and site was verified   Indications:  Pain, joint swelling and diagnostic evaluation Location:  Knee Site:  L knee Prep: patient was prepped and draped in usual sterile fashion   Needle Size:  18 G Needle Length:  1.5 inches Approach:  Superolateral Ultrasound Guidance: No   Fluoroscopic Guidance: No   Arthrogram: No   Medications:  5 mL lidocaine 1 %;  4 mL bupivacaine 0.25 %; 40 mg methylPREDNISolone acetate 40 MG/ML Aspiration Attempted: Yes   Aspirate amount (mL):  60 Aspirate:  Yellow Patient tolerance:  Patient tolerated the procedure well with no immediate complications     Clinical Data: No additional findings.  Objective: Vital Signs: There were no vitals taken for this visit.  Physical Exam   Constitutional: Patient appears well-developed HEENT:  Head: Normocephalic Eyes:EOM are normal Neck: Normal range of motion Cardiovascular: Normal rate Pulmonary/chest: Effort normal Neurologic: Patient is alert Skin: Skin is warm Psychiatric: Patient has normal mood and affect    Ortho Exam orthopedic exam demonstrates  pretty normal gait and alignment palpable pedal pulses large effusions in both knees present.  No groin pain with internal/external rotation of either knee since her mechanism is intact bilaterally.  Has full extension and flexion past 90 but it is difficult due to the amount of the effusion.  Specialty Comments:  No specialty comments available.  Imaging: No results found.   PMFS History: Patient Active Problem List   Diagnosis Date Noted  . Swelling of left knee joint 06/30/2016  . Acute pain of left knee 06/30/2016  . PCP NOTES >>>>>>>>>>>>>>>>>>>>>>>>>>>>>> 02/23/2015  . Pain in joint, shoulder region 09/10/2014  . Annual physical exam 01/15/2012  . Insomnia 11/25/2010  . DM II (diabetes mellitus, type II), controlled (HCC) 04/01/2010  . HYPERLIPIDEMIA 04/01/2010  . Essential hypertension 04/01/2010  . GERD 04/01/2010   Past Medical History:  Diagnosis Date  . Diabetes mellitus 2005  . GERD (gastroesophageal reflux disease)    mild, uses OTCS  . Hyperlipidemia   . Hypertension     Family History  Problem Relation Age of Onset  . Heart disease Father     died at age 63  . Stroke Father   . Diabetes Father   . Hypertension Father   . Colon cancer Neg Hx   . Prostate cancer Neg Hx     Past Surgical History:  Procedure Laterality Date  . NO PAST SURGERIES     Social History   Occupational History  . Heavy Arboriculturist    Social History Main Topics  . Smoking status: Never Smoker  . Smokeless tobacco: Never Used  . Alcohol use Yes     Comment: socially  . Drug use: No  . Sexual activity: Not on file

## 2016-07-02 HISTORY — PX: KNEE ARTHROSCOPY W/ SYNOVECTOMY: SHX1887

## 2016-07-09 DIAGNOSIS — M19011 Primary osteoarthritis, right shoulder: Secondary | ICD-10-CM | POA: Diagnosis not present

## 2016-07-13 ENCOUNTER — Encounter (INDEPENDENT_AMBULATORY_CARE_PROVIDER_SITE_OTHER): Payer: Self-pay | Admitting: Orthopaedic Surgery

## 2016-07-13 ENCOUNTER — Ambulatory Visit (INDEPENDENT_AMBULATORY_CARE_PROVIDER_SITE_OTHER): Payer: Managed Care, Other (non HMO) | Admitting: Orthopaedic Surgery

## 2016-07-13 VITALS — Ht 74.0 in | Wt 250.0 lb

## 2016-07-13 DIAGNOSIS — M25561 Pain in right knee: Secondary | ICD-10-CM

## 2016-07-13 DIAGNOSIS — G8929 Other chronic pain: Secondary | ICD-10-CM | POA: Diagnosis not present

## 2016-07-13 MED ORDER — LIDOCAINE HCL 1 % IJ SOLN
3.0000 mL | INTRAMUSCULAR | Status: AC | PRN
  Administered 2016-07-13: 3 mL

## 2016-07-13 MED ORDER — OXYCODONE-ACETAMINOPHEN 5-325 MG PO TABS: ORAL_TABLET | ORAL | 0 refills | Status: DC

## 2016-07-13 NOTE — Progress Notes (Signed)
Office Visit Note   Patient: Bruce Henson           Date of Birth: 28-Jun-1965           MRN: [id number] Visit Date: 07/13/2016              Requested by: Wanda Plump, MD [address] [place], [place] [address] PCP: Willow Ora, MD   Assessment & Plan: Visit Diagnoses: 4 days status post right knee arthroscopy with lateral release and synovectomy. There was considerable patellar crepitation and patellar tilt laterally.  Plan: Aspirate right knee, continue with knee immobilizer and crutches, office 1 week, renew oxycodone No. 30 Follow-Up Instructions: No Follow-up on file.   Orders:  No orders of the defined types were placed in this encounter.  No orders of the defined types were placed in this encounter.     Procedures: Large Joint Inj Date/Time: 07/13/2016 11:28 AM Performed by: Valeria Batman Authorized by: Valeria Batman   Consent Given by:  Patient Timeout: prior to procedure the correct patient, procedure, and site was verified   Indications:  Pain and joint swelling Location:  Knee Site:  R knee Prep: patient was prepped and draped in usual sterile fashion   Needle Size:  22 G Needle Length:  1.5 inches Approach:  Anteromedial Ultrasound Guidance: No   Fluoroscopic Guidance: No   Arthrogram: No   Medications:  3 mL lidocaine 1 % Aspiration Attempted: Yes   Aspirate amount (mL):  80 Patient tolerance:  Patient tolerated the procedure well with no immediate complications     Clinical Data: No additional findings.   Subjective: Chief Complaint  Patient presents with  . Right Knee - Routine Post Op    Bruce Henson is 4 days status post right knee arthroscopy. Incision is clean, dry and intact with swelling above the site. Pt wearing knee immobilizer and ambulating with crutches.  Pain has not been too bad but he is almost out of pain meds, takes Oxycodone prescribed on 07/09/16.  Denies fever or chills.  Review of  Systems   Objective: Vital Signs: There were no vitals taken for this visit.  Physical Exam  Ortho Exam which effusion right knee. Puncture sites healing nicely without evidence of infection. No calf pain. Specialty Comments:  No specialty comments available.  Imaging: No results found.   PMFS History: Patient Active Problem List   Diagnosis Date Noted  . Swelling of left knee joint 06/30/2016  . Acute pain of left knee 06/30/2016  . PCP NOTES >>>>>>>>>>>>>>>>>>>>>>>>>>>>>> 02/23/2015  . Pain in joint, shoulder region 09/10/2014  . Annual physical exam 01/15/2012  . Insomnia 11/25/2010  . DM II (diabetes mellitus, type II), controlled (HCC) 04/01/2010  . HYPERLIPIDEMIA 04/01/2010  . Essential hypertension 04/01/2010  . GERD 04/01/2010   Past Medical History:  Diagnosis Date  . Diabetes mellitus 2005  . GERD (gastroesophageal reflux disease)    mild, uses OTCS  . Hyperlipidemia   . Hypertension     Family History  Problem Relation Age of Onset  . Heart disease Father     died at age 32  . Stroke Father   . Diabetes Father   . Hypertension Father   . Colon cancer Neg Hx   . Prostate cancer Neg Hx     Past Surgical History:  Procedure Laterality Date  . NO PAST SURGERIES     Social History   Occupational History  . Heavy  Arboriculturistquipment Operator    Social History Main Topics  . Smoking status: Never Smoker  . Smokeless tobacco: Never Used  . Alcohol use Yes     Comment: socially  . Drug use: No  . Sexual activity: Not on file

## 2016-07-20 ENCOUNTER — Encounter (INDEPENDENT_AMBULATORY_CARE_PROVIDER_SITE_OTHER): Payer: Self-pay | Admitting: Orthopaedic Surgery

## 2016-07-20 ENCOUNTER — Ambulatory Visit (INDEPENDENT_AMBULATORY_CARE_PROVIDER_SITE_OTHER): Payer: Managed Care, Other (non HMO) | Admitting: Orthopaedic Surgery

## 2016-07-20 VITALS — BP 135/88 | HR 113 | Resp 16 | Ht 74.0 in | Wt 250.0 lb

## 2016-07-20 DIAGNOSIS — G8929 Other chronic pain: Secondary | ICD-10-CM | POA: Diagnosis not present

## 2016-07-20 DIAGNOSIS — M25561 Pain in right knee: Secondary | ICD-10-CM

## 2016-07-20 MED ORDER — LIDOCAINE HCL 1 % IJ SOLN
3.0000 mL | INTRAMUSCULAR | Status: AC | PRN
  Administered 2016-07-20: 3 mL

## 2016-07-20 NOTE — Progress Notes (Signed)
Office Visit Note   Patient: Bruce Henson           Date of Birth: 02-Mar-1966           MRN: [id number] Visit Date: 07/20/2016              Requested by: Wanda Plump, MD [address] [place], [place] [address] PCP: Willow Ora, MD   Assessment & Plan: Visit Diagnoses: 11 days status post right knee arthroscopic synovectomy and lateral release  Plan: Repeat knee aspiration, continue with touchdown weightbearing using the ice pack, no work, office 1 week. 53 mL of somewhat bloody fluid was aspirated with significant relief of knee pain might consider physical therapy when he returns he office next week and possibly a knee buttress support  Follow-Up Instructions: No Follow-up on file.   Orders:  No orders of the defined types were placed in this encounter.  No orders of the defined types were placed in this encounter.     Procedures: Large Joint Inj Date/Time: 07/20/2016 10:30 AM Performed by: Valeria Batman Authorized by: Valeria Batman   Consent Given by:  Patient Timeout: prior to procedure the correct patient, procedure, and site was verified   Indications:  Pain and joint swelling Location:  Knee Site:  R knee Prep: patient was prepped and draped in usual sterile fashion   Needle Size:  18 G Needle Length:  1.5 inches Approach:  Anteromedial Ultrasound Guidance: No   Fluoroscopic Guidance: No   Arthrogram: No   Medications:  3 mL lidocaine 1 % Aspiration Attempted: Yes   Aspirate amount (mL):  53 Patient tolerance:  Patient tolerated the procedure well with no immediate complications     Clinical Data: No additional findings.   Subjective: No chief complaint on file.   11 days status post right knee arthroscopy with lateral release and synovectomy. Pt relates he has been able to bend the right knee a little, it hurts when he tries to bend it too much. Pt  Ambulates with crutches.  He still takes the pain meds as prescribed. He related  the aspiration and cortisone injection provided relief.    Review of Systems   Objective: Vital Signs: There were no vitals taken for this visit.  Physical Exam  Ortho Exam right knee exam demonstrates arthroscopic portals to be healing without infection. No calf pain or lower extremity swelling. Neurovascular exam intact. Full knee extension and about 90 of flexion at which point the knee was "tight". Large effusion  Specialty Comments:  No specialty comments available.  Imaging: No results found.   PMFS History: Patient Active Problem List   Diagnosis Date Noted  . Swelling of left knee joint 06/30/2016  . Acute pain of left knee 06/30/2016  . PCP NOTES >>>>>>>>>>>>>>>>>>>>>>>>>>>>>> 02/23/2015  . Pain in joint, shoulder region 09/10/2014  . Annual physical exam 01/15/2012  . Insomnia 11/25/2010  . DM II (diabetes mellitus, type II), controlled (HCC) 04/01/2010  . HYPERLIPIDEMIA 04/01/2010  . Essential hypertension 04/01/2010  . GERD 04/01/2010   Past Medical History:  Diagnosis Date  . Diabetes mellitus 2005  . GERD (gastroesophageal reflux disease)    mild, uses OTCS  . Hyperlipidemia   . Hypertension     Family History  Problem Relation Age of Onset  . Heart disease Father     died at age 11  . Stroke Father   . Diabetes Father   . Hypertension Father   .  Colon cancer Neg Hx   . Prostate cancer Neg Hx     Past Surgical History:  Procedure Laterality Date  . NO PAST SURGERIES     Social History   Occupational History  . Heavy Arboriculturistquipment Operator    Social History Main Topics  . Smoking status: Never Smoker  . Smokeless tobacco: Never Used  . Alcohol use Yes     Comment: socially  . Drug use: No  . Sexual activity: Not on file

## 2016-07-24 ENCOUNTER — Telehealth: Payer: Self-pay

## 2016-07-24 ENCOUNTER — Ambulatory Visit (INDEPENDENT_AMBULATORY_CARE_PROVIDER_SITE_OTHER): Payer: 59 | Admitting: Internal Medicine

## 2016-07-24 ENCOUNTER — Encounter: Payer: Self-pay | Admitting: Internal Medicine

## 2016-07-24 VITALS — BP 124/68 | HR 75 | Temp 97.5°F | Resp 14 | Ht 74.0 in | Wt 279.5 lb

## 2016-07-24 DIAGNOSIS — E118 Type 2 diabetes mellitus with unspecified complications: Secondary | ICD-10-CM | POA: Diagnosis not present

## 2016-07-24 DIAGNOSIS — E785 Hyperlipidemia, unspecified: Secondary | ICD-10-CM | POA: Diagnosis not present

## 2016-07-24 DIAGNOSIS — I10 Essential (primary) hypertension: Secondary | ICD-10-CM | POA: Diagnosis not present

## 2016-07-24 LAB — CBC WITH DIFFERENTIAL/PLATELET
BASOS ABS: 0 {cells}/uL (ref 0–200)
Basophils Relative: 0 %
EOS ABS: 335 {cells}/uL (ref 15–500)
Eosinophils Relative: 5 %
HCT: 39.9 % (ref 38.5–50.0)
Hemoglobin: 13.1 g/dL — ABNORMAL LOW (ref 13.2–17.1)
LYMPHS PCT: 33 %
Lymphs Abs: 2211 cells/uL (ref 850–3900)
MCH: 28.7 pg (ref 27.0–33.0)
MCHC: 32.8 g/dL (ref 32.0–36.0)
MCV: 87.5 fL (ref 80.0–100.0)
MONOS PCT: 8 %
MPV: 10.6 fL (ref 7.5–12.5)
Monocytes Absolute: 536 cells/uL (ref 200–950)
NEUTROS PCT: 54 %
Neutro Abs: 3618 cells/uL (ref 1500–7800)
PLATELETS: 286 10*3/uL (ref 140–400)
RBC: 4.56 MIL/uL (ref 4.20–5.80)
RDW: 13.1 % (ref 11.0–15.0)
WBC: 6.7 10*3/uL (ref 3.8–10.8)

## 2016-07-24 LAB — BASIC METABOLIC PANEL
BUN: 9 mg/dL (ref 7–25)
CHLORIDE: 101 mmol/L (ref 98–110)
CO2: 28 mmol/L (ref 20–31)
CREATININE: 0.83 mg/dL (ref 0.70–1.33)
Calcium: 8.8 mg/dL (ref 8.6–10.3)
Glucose, Bld: 112 mg/dL — ABNORMAL HIGH (ref 65–99)
Potassium: 4.2 mmol/L (ref 3.5–5.3)
Sodium: 139 mmol/L (ref 135–146)

## 2016-07-24 NOTE — Patient Instructions (Signed)
   GO TO THE LAB : Get the blood work     GO TO THE FRONT DESK Schedule your next appointment for a  physical exam in 4-5 months    Check the  blood pressure 2 or 3 times a month   Be sure your blood pressure is between 110/65 and  135/85. If it is consistently higher or lower, let me know

## 2016-07-24 NOTE — Telephone Encounter (Signed)
Cologuard ordered via [employer] online portal. Order number: [id number]. Order confirmation sent for scanning.

## 2016-07-24 NOTE — Progress Notes (Signed)
Pre visit review using our clinic review tool, if applicable. No additional management support is needed unless otherwise documented below in the visit note. 

## 2016-07-24 NOTE — Progress Notes (Signed)
Subjective:    Patient ID: Bruce Henson, male    DOB: 1965/11/16, 51 y.o.   MRN: [id number]  DOS:  07/24/2016 Type of visit - description : Routine visit Interval history: Last seen May 2017, doing okay from the medical standpoint, he had lots of problems with his knee. Still recovering from surgery. Ambulatory CBGs, no ambulatory BPs. Good medication compliance. No apparent side effects.  Review of Systems   Past Medical History:  Diagnosis Date  . Diabetes mellitus 2005  . GERD (gastroesophageal reflux disease)    mild, uses OTCS  . Hyperlipidemia   . Hypertension     Past Surgical History:  Procedure Laterality Date  . KNEE ARTHROSCOPY W/ SYNOVECTOMY Right 07/2016    Social History   Social History  . Marital status: Single    Spouse name: N/A  . Number of children: 2  . Years of education: N/A   Occupational History  . Heavy Arboriculturistquipment Operator    Social History Main Topics  . Smoking status: Never Smoker  . Smokeless tobacco: Never Used  . Alcohol use Yes     Comment: socially  . Drug use: No  . Sexual activity: Not on file   Other Topics Concern  . Not on file   Social History Narrative   Divorced, remarried, lives w/ wife      Allergies as of 07/24/2016   No Known Allergies     Medication List       Accurate as of 07/24/16 11:59 PM. Always use your most recent med list.          aspirin 81 MG tablet Take 81 mg by mouth daily.   hydrochlorothiazide 25 MG tablet Commonly known as:  HYDRODIURIL Take 1 tablet (25 mg total) by mouth daily.   ibuprofen 200 MG tablet Commonly known as:  ADVIL,MOTRIN Take 600 mg by mouth every 6 (six) hours as needed for moderate pain.   metFORMIN 1000 MG tablet Commonly known as:  GLUCOPHAGE Take 1 tablet (1,000 mg total) by mouth 2 (two) times daily with a meal.   metoprolol succinate 25 MG 24 hr tablet Commonly known as:  TOPROL-XL Take 1 tablet (25 mg total) by mouth daily.     oxyCODONE-acetaminophen 5-325 MG tablet Commonly known as:  PERCOCET/ROXICET TK 1-2 TS PO Q 3-6 H PRN. MAX OF 12 TS D.   pioglitazone 30 MG tablet Commonly known as:  ACTOS Take 1 tablet (30 mg total) by mouth daily.   pravastatin 20 MG tablet Commonly known as:  PRAVACHOL Take 1 tablet (20 mg total) by mouth at bedtime.   ranitidine 300 MG tablet Commonly known as:  ZANTAC Take 1 tablet (300 mg total) by mouth at bedtime.   sildenafil 100 MG tablet Commonly known as:  VIAGRA Take 0.5-1 tablets (50-100 mg total) by mouth daily as needed for erectile dysfunction.   sitaGLIPtin 100 MG tablet Commonly known as:  JANUVIA Take 1 tablet (100 mg total) by mouth daily.   zolpidem 10 MG tablet Commonly known as:  AMBIEN Take 1 tablet (10 mg total) by mouth at bedtime as needed for sleep.          Objective:   Physical Exam BP 124/68 (BP Location: Left Arm, Patient Position: Sitting, Cuff Size: Normal)   Pulse 75   Temp 97.5 F (36.4 C) (Oral)   Resp 14   Ht 6\' 2"  (1.88 m)   Wt 279 lb 8 oz (126.8 kg)  SpO2 97%   BMI 35.89 kg/m  General:   Well developed, well nourished . NAD.  HEENT:  Normocephalic . Face symmetric, atraumatic Lungs:  CTA B Normal respiratory effort, no intercostal retractions, no accessory muscle use. Heart: RRR,  no murmur.  No pretibial edema bilaterally  Skin: Not pale. Not jaundice Neurologic:  alert & oriented X3.  Speech normal, gait limited by right knee pain, he has a knee brace. Psych--  Cognition and judgment appear intact.  Cooperative with normal attention span and concentration.  Behavior appropriate. No anxious or depressed appearing.      Assessment & Plan:   Assessment>  DM 2005 HTN Hyperlipidemia GERD   PLAN: DM: Has been unable to exercise due to knee problems, doing okay with diet, continue Actos, metformin, Januvia, check labs HTN: No ambulatory BPs, BP today is very good, continue hydrochlorothiazide, Toprol,  check a BMP and CBC High cholesterol: Last FLP satisfactory Knee pain-  had surgery, gradually recovering. Primary care: Interested on colon cancer screening, 3 different modalities discussed, elected COLOGUARD. Will send a referral. RTC 4-5 months CPX.

## 2016-07-25 LAB — MICROALBUMIN / CREATININE URINE RATIO
Creatinine, Urine: 175 mg/dL (ref 20–370)
Microalb Creat Ratio: 2 mcg/mg creat (ref <30)
Microalb, Ur: 0.3 mg/dL

## 2016-07-25 LAB — HEMOGLOBIN A1C
HEMOGLOBIN A1C: 6 % — AB (ref <5.7)
Mean Plasma Glucose: 126 mg/dL

## 2016-07-26 NOTE — Assessment & Plan Note (Signed)
DM: Has been unable to exercise due to knee problems, doing okay with diet, continue Actos, metformin, Januvia, check labs HTN: No ambulatory BPs, BP today is very good, continue hydrochlorothiazide, Toprol, check a BMP and CBC High cholesterol: Last FLP satisfactory Knee pain-  had surgery, gradually recovering. Primary care: Interested on colon cancer screening, 3 different modalities discussed, elected COLOGUARD. Will send a referral. RTC 4-5 months CPX.

## 2016-07-27 ENCOUNTER — Ambulatory Visit (INDEPENDENT_AMBULATORY_CARE_PROVIDER_SITE_OTHER): Payer: Self-pay | Admitting: Orthopaedic Surgery

## 2016-07-27 ENCOUNTER — Other Ambulatory Visit (INDEPENDENT_AMBULATORY_CARE_PROVIDER_SITE_OTHER): Payer: Self-pay

## 2016-07-27 ENCOUNTER — Encounter (INDEPENDENT_AMBULATORY_CARE_PROVIDER_SITE_OTHER): Payer: Self-pay | Admitting: Orthopaedic Surgery

## 2016-07-27 VITALS — BP 141/85 | HR 72 | Resp 16 | Ht 74.0 in | Wt 279.0 lb

## 2016-07-27 DIAGNOSIS — G8929 Other chronic pain: Secondary | ICD-10-CM

## 2016-07-27 DIAGNOSIS — Z9889 Other specified postprocedural states: Secondary | ICD-10-CM

## 2016-07-27 DIAGNOSIS — M25561 Pain in right knee: Secondary | ICD-10-CM

## 2016-07-27 MED ORDER — METFORMIN HCL 1000 MG PO TABS: 1000.0000 mg | ORAL_TABLET | Freq: Two times a day (BID) | ORAL | 2 refills | Status: DC

## 2016-07-27 MED ORDER — SITAGLIPTIN PHOSPHATE 100 MG PO TABS: 100.0000 mg | ORAL_TABLET | Freq: Every day | ORAL | 2 refills | Status: DC

## 2016-07-27 MED ORDER — HYDROCHLOROTHIAZIDE 25 MG PO TABS: 25.0000 mg | ORAL_TABLET | Freq: Every day | ORAL | 2 refills | Status: DC

## 2016-07-27 MED ORDER — PRAVASTATIN SODIUM 20 MG PO TABS: 20.0000 mg | ORAL_TABLET | Freq: Every day | ORAL | 2 refills | Status: DC

## 2016-07-27 MED ORDER — PIOGLITAZONE HCL 30 MG PO TABS: 30.0000 mg | ORAL_TABLET | Freq: Every day | ORAL | 2 refills | Status: DC

## 2016-07-27 MED ORDER — RANITIDINE HCL 300 MG PO TABS: 300.0000 mg | ORAL_TABLET | Freq: Every day | ORAL | 2 refills | Status: DC

## 2016-07-27 MED ORDER — METOPROLOL SUCCINATE ER 25 MG PO TB24: 25.0000 mg | ORAL_TABLET | Freq: Every day | ORAL | 2 refills | Status: DC

## 2016-07-27 NOTE — Progress Notes (Signed)
   Office Visit Note   Patient: Bruce Henson           Date of Birth: Feb 28, 1966           MRN: [id number] Visit Date: 07/27/2016              Requested by: Wanda PlumpJose E Paz, MD [address] [place], [place] [id number] PCP: Willow OraJose Paz, MD   Assessment & Plan: Visit Diagnoses: Nearly 3 weeks status post right knee arthroscopy with lateral release. He is feeling much better but still feels "weak." He denies fever or chills.  Plan: Continue out of work, start physical therapy, office in 2 weeks Consider repeat aspiration and cortisone injection on next office visit. Follow-Up Instructions: No Follow-up on file.   Orders:  No orders of the defined types were placed in this encounter.  No orders of the defined types were placed in this encounter.     Procedures: No procedures performed   Clinical Data: No additional findings.   Subjective: No chief complaint on file.   Mr. Bruce Henson is a 51 year old male 2 weeks, 4  days status post right knee arthroscopy with lateral release and synovectomy. Pt relates he has been able to bend the right knee a little, it hurts when he tries to bend it too much. Pt  Ambulates unassisted.  He still takes the pain meds as prescribed. He related the aspiration  provided relief. Pt does wear a knee brace for support.     Review of Systems   Objective: Vital Signs: There were no vitals taken for this visit.  Physical Exam  Ortho Exam right knee exam demonstrates recurrent effusion. The knee was not hot red or swollen. No instability. Really not much tenderness along the area of the lateral release. Arthroscopic portals are clean and dry. No calf pain.  Specialty Comments:  No specialty comments available.  Imaging: No results found.   PMFS History: Patient Active Problem List   Diagnosis Date Noted  . Swelling of left knee joint 06/30/2016  . Acute pain of left knee 06/30/2016  . PCP NOTES >>>>>>>>>>>>>>>>>>>>>>>>>>>>>>  02/23/2015  . Pain in joint, shoulder region 09/10/2014  . Annual physical exam 01/15/2012  . Insomnia 11/25/2010  . DM II (diabetes mellitus, type II), controlled (HCC) 04/01/2010  . Hyperlipidemia 04/01/2010  . Essential hypertension 04/01/2010  . GERD 04/01/2010   Past Medical History:  Diagnosis Date  . Diabetes mellitus 2005  . GERD (gastroesophageal reflux disease)    mild, uses OTCS  . Hyperlipidemia   . Hypertension     Family History  Problem Relation Age of Onset  . Heart disease Father     died at age 51  . Stroke Father   . Diabetes Father   . Hypertension Father   . Colon cancer Neg Hx   . Prostate cancer Neg Hx     Past Surgical History:  Procedure Laterality Date  . KNEE ARTHROSCOPY W/ SYNOVECTOMY Right 07/2016   Social History   Occupational History  . Heavy Arboriculturistquipment Operator    Social History Main Topics  . Smoking status: Never Smoker  . Smokeless tobacco: Never Used  . Alcohol use Yes     Comment: socially  . Drug use: No  . Sexual activity: Not on file

## 2016-07-28 ENCOUNTER — Other Ambulatory Visit: Payer: Self-pay | Admitting: *Deleted

## 2016-07-28 DIAGNOSIS — M25561 Pain in right knee: Principal | ICD-10-CM

## 2016-07-28 DIAGNOSIS — G8929 Other chronic pain: Secondary | ICD-10-CM

## 2016-07-29 ENCOUNTER — Other Ambulatory Visit: Payer: Self-pay

## 2016-07-29 MED ORDER — PRAVASTATIN SODIUM 20 MG PO TABS: 20.0000 mg | ORAL_TABLET | Freq: Every day | ORAL | 2 refills | Status: DC

## 2016-07-29 MED ORDER — HYDROCHLOROTHIAZIDE 25 MG PO TABS: 25.0000 mg | ORAL_TABLET | Freq: Every day | ORAL | 2 refills | Status: DC

## 2016-07-29 MED ORDER — SITAGLIPTIN PHOSPHATE 100 MG PO TABS: 100.0000 mg | ORAL_TABLET | Freq: Every day | ORAL | 2 refills | Status: DC

## 2016-07-29 MED ORDER — RANITIDINE HCL 300 MG PO TABS: 300.0000 mg | ORAL_TABLET | Freq: Every day | ORAL | 2 refills | Status: AC

## 2016-07-29 MED ORDER — METFORMIN HCL 1000 MG PO TABS
1000.0000 mg | ORAL_TABLET | Freq: Two times a day (BID) | ORAL | 2 refills | Status: DC
Start: 2016-07-29 — End: 2017-07-19

## 2016-08-04 ENCOUNTER — Ambulatory Visit: Payer: 59 | Attending: Orthopaedic Surgery | Admitting: Physical Therapy

## 2016-08-04 ENCOUNTER — Encounter: Payer: Self-pay | Admitting: Physical Therapy

## 2016-08-04 DIAGNOSIS — M25661 Stiffness of right knee, not elsewhere classified: Secondary | ICD-10-CM | POA: Diagnosis present

## 2016-08-04 DIAGNOSIS — R2241 Localized swelling, mass and lump, right lower limb: Secondary | ICD-10-CM | POA: Diagnosis present

## 2016-08-04 DIAGNOSIS — M25561 Pain in right knee: Secondary | ICD-10-CM | POA: Diagnosis present

## 2016-08-04 DIAGNOSIS — R262 Difficulty in walking, not elsewhere classified: Secondary | ICD-10-CM | POA: Diagnosis present

## 2016-08-04 LAB — COLOGUARD: COLOGUARD: NEGATIVE

## 2016-08-04 NOTE — Therapy (Signed)
[facility] [place] [facility] [address] [place], [place], [address] Phone: [phone]   Fax:  [phone]  Physical Therapy Evaluation  Patient Details  Name: Bruce Henson MRN: [id number] Date of Birth: 31-Mar-1966 Referring Provider: Serita Grit  Encounter Date: 08/04/2016      PT End of Session - 08/04/16 1332    Visit Number 1   Date for PT Re-Evaluation 10/04/16   PT Start Time 1312   PT Stop Time 1410   PT Time Calculation (min) 58 min   Activity Tolerance Patient tolerated treatment well   Behavior During Therapy [facility] for tasks assessed/performed      Past Medical History:  Diagnosis Date  . Diabetes mellitus 2005  . GERD (gastroesophageal reflux disease)    mild, uses OTCS  . Hyperlipidemia   . Hypertension     Past Surgical History:  Procedure Laterality Date  . KNEE ARTHROSCOPY W/ SYNOVECTOMY Right 07/2016    There were no vitals filed for this visit.       Subjective Assessment - 08/04/16 1315    Subjective Patient reports he had a right knee scope 07/09/16.  He reports he had been having right knee pain for about a year.  He reports that he has not done anything since the surgery.   Limitations Walking   Patient Stated Goals go back to work without difficulty   Currently in Pain? Yes   Pain Score 1    Pain Location Knee   Pain Orientation Right   Pain Descriptors / Indicators Aching   Pain Type Surgical pain   Pain Onset More than a month ago   Pain Frequency Intermittent   Aggravating Factors  bending, walking and standing pain will be up to 8/10   Pain Relieving Factors rest, ice and pain meds, pain can be 0/10   Effect of Pain on Daily Activities not working right now            [facility] PT Assessment - 08/04/16 0001      Assessment   Medical Diagnosis right knee scope   Referring Provider [place]   Onset Date/Surgical Date 07/09/16   Prior Therapy no     Precautions   Precautions None      Balance Screen   Has the patient fallen in the past 6 months No   Has the patient had a decrease in activity level because of a fear of falling?  No   Is the patient reluctant to leave their home because of a fear of falling?  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   Leisure no exercise     Observation/Other Assessments-Edema    Edema Circumferential     Circumferential Edema   Circumferential - Right 46 cm mid patella   Circumferential - Left  44.5 cm     ROM / Strength   AROM / PROM / Strength AROM;PROM;Strength     AROM   AROM Assessment Site Knee   Right/Left Knee Right   Right Knee Extension 17   Right Knee Flexion 104     PROM   PROM Assessment Site Knee   Right/Left Knee Right   Right Knee Extension 5   Right Knee Flexion  110     Strength   Overall Strength Comments 4-/5 with pain     Flexibility   Soft Tissue Assessment /Muscle Length --  tight ITB, HS and calf     Palpation   Palpation comment has a large pocket of swelling lateral right knee just proximal to the lateral scope scar that extends about 8cm and is 4.5 cm across and is very puffy     Ambulation/Gait   Gait Comments patient reports that he was using crutches for 3 weeks, no no device, uses a brace, slow and antalgic on the right                   [facility] Adult PT Treatment/Exercise - 08/04/16 0001      Exercises   Exercises Knee/Hip     Knee/Hip Exercises: Aerobic   Nustep level 3 x 5 minutes, no arms                PT Education - 08/04/16 1331    Education provided Yes   Education Details low load long duration stretches for flexion and extension, QS   Person(s) Educated Patient   Methods Explanation;Demonstration;Handout   Comprehension Verbalized  understanding;Returned demonstration          PT Short Term Goals - 08/04/16 1336      PT SHORT TERM GOAL #1   Title independent with initial HEP   Time 2   Period Weeks   Status New           PT Long Term Goals - 08/04/16 1336      PT LONG TERM GOAL #1   Title decrease pain 50%   Time 8   Period Weeks   Status New     PT LONG TERM GOAL #2   Title increase AROM of right knee to 0-115 degrees flexion   Time 8   Period Weeks   Status New     PT LONG TERM GOAL #3   Title be able to go up and down a ladder without difficulty   Time 8   Period Weeks   Status New     PT LONG TERM GOAL #4   Title increase right knee strength to 5/5   Time 8   Period Weeks   Status New               Plan - 08/04/16 1333    Clinical Impression Statement Patient with a right knee scope meniscal debridment on 07/09/16.  He was on crutches for 3 weeks.  He has a large area of swelling laterally.  He is stiff.  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   Rehab Potential Good   PT Frequency 2x / week   PT Duration 8 weeks   PT Treatment/Interventions ADLs/Self Care Home Management;Cryotherapy;Electrical Stimulation;Stair training;Gait training;Functional mobility training;Patient/family education;Therapeutic exercise;Balance training;Therapeutic activities;Manual techniques;Vasopneumatic Device   PT Next Visit Plan slowly add exercises to strneghten the VMO   Consulted and Agree with Plan of Care Patient      Patient will benefit from skilled therapeutic intervention in order to improve the following deficits and impairments:  Abnormal gait, Decreased activity tolerance, Decreased balance, Decreased mobility, Decreased strength, Increased edema, Decreased range of motion, Difficulty walking, Pain, Impaired flexibility  Visit Diagnosis: Acute pain of right knee - Plan: PT plan of care cert/re-cert  Stiffness of right knee,  not elsewhere  classified - Plan: PT plan of care cert/re-cert  Difficulty in walking, not elsewhere classified - Plan: PT plan of care cert/re-cert  Localized swelling, mass and lump, right lower limb - Plan: PT plan of care cert/re-cert     Problem List Patient Active Problem List   Diagnosis Date Noted  . Swelling of left knee joint 06/30/2016  . Acute pain of left knee 06/30/2016  . PCP NOTES >>>>>>>>>>>>>>>>>>>>>>>>>>>>>> 02/23/2015  . Pain in joint, shoulder region 09/10/2014  . Annual physical exam 01/15/2012  . Insomnia 11/25/2010  . DM II (diabetes mellitus, type II), controlled (HCC) 04/01/2010  . Hyperlipidemia 04/01/2010  . Essential hypertension 04/01/2010  . GERD 04/01/2010    Jearld Lesch., PT 08/04/2016, 1:45 PM  [facility] [place] [facility] [address] [place], [place], [address] Phone: [phone]   Fax:  [phone]  Name: Bruce Henson MRN: [id number] Date of Birth: March 21, 1966

## 2016-08-05 ENCOUNTER — Other Ambulatory Visit: Payer: Self-pay

## 2016-08-05 MED ORDER — METOPROLOL SUCCINATE ER 25 MG PO TB24: 25.0000 mg | ORAL_TABLET | Freq: Every day | ORAL | 2 refills | Status: DC

## 2016-08-05 MED ORDER — PIOGLITAZONE HCL 30 MG PO TABS: 30.0000 mg | ORAL_TABLET | Freq: Every day | ORAL | 2 refills | Status: DC

## 2016-08-06 ENCOUNTER — Ambulatory Visit: Payer: 59 | Admitting: Physical Therapy

## 2016-08-06 ENCOUNTER — Encounter: Payer: Self-pay | Admitting: Physical Therapy

## 2016-08-06 DIAGNOSIS — R2241 Localized swelling, mass and lump, right lower limb: Secondary | ICD-10-CM

## 2016-08-06 DIAGNOSIS — M25661 Stiffness of right knee, not elsewhere classified: Secondary | ICD-10-CM

## 2016-08-06 DIAGNOSIS — M25561 Pain in right knee: Secondary | ICD-10-CM | POA: Diagnosis not present

## 2016-08-06 DIAGNOSIS — R262 Difficulty in walking, not elsewhere classified: Secondary | ICD-10-CM

## 2016-08-06 NOTE — Therapy (Signed)
[facility] [address] [place], [place], [address] Phone: [phone]   Fax:  [phone]  Physical Therapy Treatment  Patient Details  Name: Bruce Henson MRN: [id number] Date of Birth: 09-04-1965 Referring Provider: Serita Grit  Encounter Date: 08/06/2016      PT End of Session - 08/06/16 1656    Visit Number 2   Date for PT Re-Evaluation 10/04/16   PT Start Time 1615   PT Stop Time 1707   PT Time Calculation (min) 52 min   Activity Tolerance Patient tolerated treatment well   Behavior During Therapy [facility] for tasks assessed/performed      Past Medical History:  Diagnosis Date  . Diabetes mellitus 2005  . GERD (gastroesophageal reflux disease)    mild, uses OTCS  . Hyperlipidemia   . Hypertension     Past Surgical History:  Procedure Laterality Date  . KNEE ARTHROSCOPY W/ SYNOVECTOMY Right 07/2016    There were no vitals filed for this visit.      Subjective Assessment - 08/06/16 1618    Subjective Pt. reports no pain and hasn't had any issues since last treatment.    Currently in Pain? No/denies   Pain Score 0-No pain                         [facility] Adult PT Treatment/Exercise - 08/06/16 0001      Knee/Hip Exercises: Aerobic   Nustep lvl 3 6 min     Knee/Hip Exercises: Machines for Strengthening   Cybex Knee Extension 15lbs 2x10   Cybex Knee Flexion 25lbs 2x10   Cybex Leg Press 20lbs 1x4, 60lbs 2x10     Modalities   Modalities Vasopneumatic;Electrical Stimulation     Electrical Stimulation   Electrical Stimulation Location R knee   Electrical Stimulation Action IFC   Electrical Stimulation Goals Edema     Vasopneumatic   Number Minutes Vasopneumatic  15 minutes   Vasopnuematic Location  Knee   Vasopneumatic Pressure Medium     Manual Therapy   Manual Therapy Passive ROM   Manual therapy comments some passive ROM held at end range flexion and extension 5 reps 10sec  hold                  PT Short Term Goals - 08/04/16 1336      PT SHORT TERM GOAL #1   Title independent with initial HEP   Time 2   Period Weeks   Status New           PT Long Term Goals - 08/04/16 1336      PT LONG TERM GOAL #1   Title decrease pain 50%   Time 8   Period Weeks   Status New     PT LONG TERM GOAL #2   Title increase AROM of right knee to 0-115 degrees flexion   Time 8   Period Weeks   Status New     PT LONG TERM GOAL #3   Title be able to go up and down a ladder without difficulty   Time 8   Period Weeks   Status New     PT LONG TERM GOAL #4   Title increase right knee strength to 5/5   Time 8   Period Weeks   Status New               Plan - 08/06/16 1659  Clinical Impression Statement Pt. reports no issues since last treatment. Pt. was able to complete all exercises without any difficulty but reported that the exercises were a challenge.     Rehab Potential Good   PT Frequency 2x / week   PT Next Visit Plan continue with strengthening and muscular endurance exercises.       Patient will benefit from skilled therapeutic intervention in order to improve the following deficits and impairments:  Abnormal gait, Decreased activity tolerance, Decreased balance, Decreased mobility, Decreased strength, Increased edema, Decreased range of motion, Difficulty walking, Pain, Impaired flexibility  Visit Diagnosis: Acute pain of right knee  Stiffness of right knee, not elsewhere classified  Difficulty in walking, not elsewhere classified  Localized swelling, mass and lump, right lower limb     Problem List Patient Active Problem List   Diagnosis Date Noted  . Swelling of left knee joint 06/30/2016  . Acute pain of left knee 06/30/2016  . PCP NOTES >>>>>>>>>>>>>>>>>>>>>>>>>>>>>> 02/23/2015  . Pain in joint, shoulder region 09/10/2014  . Annual physical exam 01/15/2012  . Insomnia 11/25/2010  . DM II (diabetes mellitus,  type II), controlled (HCC) 04/01/2010  . Hyperlipidemia 04/01/2010  . Essential hypertension 04/01/2010  . GERD 04/01/2010    Forde Radon, SPTA 08/06/2016, 5:07 PM  [facility] [place] [facility] [address] [place], [place], [address] Phone: [phone]   Fax:  [phone]  Name: Bruce Henson MRN: [id number] Date of Birth: 07-04-1965

## 2016-08-10 ENCOUNTER — Ambulatory Visit: Payer: 59 | Admitting: Physical Therapy

## 2016-08-10 ENCOUNTER — Encounter: Payer: Self-pay | Admitting: Physical Therapy

## 2016-08-10 ENCOUNTER — Ambulatory Visit (INDEPENDENT_AMBULATORY_CARE_PROVIDER_SITE_OTHER): Payer: Managed Care, Other (non HMO) | Admitting: Orthopaedic Surgery

## 2016-08-10 ENCOUNTER — Encounter (INDEPENDENT_AMBULATORY_CARE_PROVIDER_SITE_OTHER): Payer: Self-pay | Admitting: Orthopaedic Surgery

## 2016-08-10 VITALS — Ht 74.0 in | Wt 279.0 lb

## 2016-08-10 DIAGNOSIS — M25661 Stiffness of right knee, not elsewhere classified: Secondary | ICD-10-CM

## 2016-08-10 DIAGNOSIS — R2241 Localized swelling, mass and lump, right lower limb: Secondary | ICD-10-CM

## 2016-08-10 DIAGNOSIS — M25561 Pain in right knee: Secondary | ICD-10-CM

## 2016-08-10 DIAGNOSIS — G8929 Other chronic pain: Secondary | ICD-10-CM | POA: Diagnosis not present

## 2016-08-10 DIAGNOSIS — R262 Difficulty in walking, not elsewhere classified: Secondary | ICD-10-CM

## 2016-08-10 MED ORDER — TRAMADOL HCL 50 MG PO TABS: ORAL_TABLET | ORAL | 0 refills | Status: DC

## 2016-08-10 MED ORDER — METHYLPREDNISOLONE ACETATE 40 MG/ML IJ SUSP
80.0000 mg | INTRAMUSCULAR | Status: AC | PRN
  Administered 2016-08-10: 80 mg

## 2016-08-10 MED ORDER — TRAMADOL HCL 50 MG PO TABS: 50.0000 mg | ORAL_TABLET | Freq: Two times a day (BID) | ORAL | 0 refills | Status: DC

## 2016-08-10 MED ORDER — BUPIVACAINE HCL 0.5 % IJ SOLN
3.0000 mL | INTRAMUSCULAR | Status: AC | PRN
  Administered 2016-08-10: 3 mL via INTRA_ARTICULAR

## 2016-08-10 MED ORDER — LIDOCAINE HCL 1 % IJ SOLN
5.0000 mL | INTRAMUSCULAR | Status: AC | PRN
  Administered 2016-08-10: 5 mL

## 2016-08-10 NOTE — Progress Notes (Signed)
Office Visit Note   Patient: Bruce Henson           Date of Birth: Mar 19, 1966           MRN: [id number] Visit Date: 08/10/2016              Requested by: Wanda Plump, MD [address] [place], [place] [address] PCP: Willow Ora, MD   Assessment & Plan: Visit Diagnoses: Recurrent postop right knee effusion:   Plan: Aspirate right knee of 60 mL of minimally blood-tinged fluid and injected 80 mg Depo-Medrol. Continue out of work. Continue physical therapy and renew analgesic prescription for tramadol  Follow-Up Instructions: No Follow-up on file.   Orders:  No orders of the defined types were placed in this encounter.  No orders of the defined types were placed in this encounter.     Procedures: Large Joint Inj Date/Time: 08/10/2016 4:01 PM Performed by: Valeria Batman Authorized by: Valeria Batman   Consent Given by:  Patient Timeout: prior to procedure the correct patient, procedure, and site was verified   Indications:  Pain and joint swelling Location:  Knee Site:  R knee Prep: patient was prepped and draped in usual sterile fashion   Needle Size:  25 G Needle Length:  1.5 inches Approach:  Anteromedial Ultrasound Guidance: No   Fluoroscopic Guidance: No   Arthrogram: No   Medications:  5 mL lidocaine 1 %; 80 mg methylPREDNISolone acetate 40 MG/ML; 3 mL bupivacaine 0.5 % Aspiration Attempted: No   Aspirate amount (mL):  60 Aspirate:  Blood-tinged Patient tolerance:  Patient tolerated the procedure well with no immediate complications     Clinical Data: No additional findings.   Subjective: No chief complaint on file.   Bruce Henson is a 51 year old male 4 weeks status post right knee arthroscopy with lateral release and synovectomy. Pt relates he has been able to bend the right knee a little, it hurts when he tries to bend it too much. Pt  Ambulates unassisted.  He still takes the pain meds as prescribed. He related the aspiration   provided relief. Pt does wear a knee brace for support.   Bruce Henson has had recurrent effusions in his right knee postoperatively. He's had significant relief with each aspiration. Denies fever or chills. Feeling a little better with less pain. Has not been back to work Review of Systems   Objective: Vital Signs: There were no vitals taken for this visit.  Physical Exam  Ortho Exam large effusion right knee. Minimal warmth. No significant pain. Full extension flex about 90 based on the effusion. No calf pain. Neurovascular exam intact.  Specialty Comments:  No specialty comments available.  Imaging: No results found.   PMFS History: Patient Active Problem List   Diagnosis Date Noted  . Swelling of left knee joint 06/30/2016  . Acute pain of left knee 06/30/2016  . PCP NOTES >>>>>>>>>>>>>>>>>>>>>>>>>>>>>> 02/23/2015  . Pain in joint, shoulder region 09/10/2014  . Annual physical exam 01/15/2012  . Insomnia 11/25/2010  . DM II (diabetes mellitus, type II), controlled (HCC) 04/01/2010  . Hyperlipidemia 04/01/2010  . Essential hypertension 04/01/2010  . GERD 04/01/2010   Past Medical History:  Diagnosis Date  . Diabetes mellitus 2005  . GERD (gastroesophageal reflux disease)    mild, uses OTCS  . Hyperlipidemia   . Hypertension     Family History  Problem Relation Age of Onset  . Heart disease Father  died at age 37  . Stroke Father   . Diabetes Father   . Hypertension Father   . Colon cancer Neg Hx   . Prostate cancer Neg Hx     Past Surgical History:  Procedure Laterality Date  . KNEE ARTHROSCOPY W/ SYNOVECTOMY Right 07/2016   Social History   Occupational History  . Heavy Arboriculturist    Social History Main Topics  . Smoking status: Never Smoker  . Smokeless tobacco: Never Used  . Alcohol use Yes     Comment: socially  . Drug use: No  . Sexual activity: Not on file

## 2016-08-10 NOTE — Therapy (Signed)
[facility] [address] [place], [place], [address] Phone: [phone]   Fax:  [phone]  Physical Therapy Treatment  Patient Details  Name: Bruce Henson MRN: [id number] Date of Birth: 06-12-1965 Referring Provider: Serita Grit  Encounter Date: 08/10/2016      PT End of Session - 08/10/16 1142    Visit Number 3   Date for PT Re-Evaluation 10/04/16   PT Start Time 1100   PT Stop Time 1155   PT Time Calculation (min) 55 min   Activity Tolerance Patient tolerated treatment well   Behavior During Therapy [facility] for tasks assessed/performed      Past Medical History:  Diagnosis Date  . Diabetes mellitus 2005  . GERD (gastroesophageal reflux disease)    mild, uses OTCS  . Hyperlipidemia   . Hypertension     Past Surgical History:  Procedure Laterality Date  . KNEE ARTHROSCOPY W/ SYNOVECTOMY Right 07/2016    There were no vitals filed for this visit.      Subjective Assessment - 08/10/16 1103    Subjective Pt. reports no pain and no problems since last treatment feels the same.    Currently in Pain? No/denies   Pain Score 0-No pain                         [facility] Adult PT Treatment/Exercise - 08/10/16 0001      High Level Balance   High Level Balance Activities Other (comment)  R, SLS cone touch 3x8      Knee/Hip Exercises: Aerobic   Nustep Lvl 3 6 min     Knee/Hip Exercises: Machines for Strengthening   Cybex Knee Extension 15lbs 2x10   Cybex Knee Flexion 25lbs 2x10   Cybex Leg Press 60lbs 2x10     Knee/Hip Exercises: Standing   Lateral Step Up 2 sets;10 reps   Lateral Step Up Limitations 4lbs   Forward Step Up 2 sets;10 reps   Forward Step Up Limitations 4lbs     Knee/Hip Exercises: Seated   Sit to Sand 2 sets;10 reps;without UE support  4lbs      Vasopneumatic   Number Minutes Vasopneumatic  15 minutes   Vasopnuematic Location  Knee   Vasopneumatic Pressure Medium                   PT Short Term Goals - 08/04/16 1336      PT SHORT TERM GOAL #1   Title independent with initial HEP   Time 2   Period Weeks   Status New           PT Long Term Goals - 08/04/16 1336      PT LONG TERM GOAL #1   Title decrease pain 50%   Time 8   Period Weeks   Status New     PT LONG TERM GOAL #2   Title increase AROM of right knee to 0-115 degrees flexion   Time 8   Period Weeks   Status New     PT LONG TERM GOAL #3   Title be able to go up and down a ladder without difficulty   Time 8   Period Weeks   Status New     PT LONG TERM GOAL #4   Title increase right knee strength to 5/5   Time 8   Period Weeks   Status New  Plan - 08/10/16 1147    Clinical Impression Statement Pt. reports no issues since last treatment. Pt completed exercises with out any difficulty. Pt. still has swelling in R knee plans on going to doctor today. Reported tightness in R knee after exercises.   Rehab Potential Good   PT Duration 8 weeks   PT Next Visit Plan continue with strengthening and muscular endurance exercises.       Patient will benefit from skilled therapeutic intervention in order to improve the following deficits and impairments:  Abnormal gait, Decreased activity tolerance, Decreased balance, Decreased mobility, Decreased strength, Increased edema, Decreased range of motion, Difficulty walking, Pain, Impaired flexibility  Visit Diagnosis: Acute pain of right knee  Stiffness of right knee, not elsewhere classified  Difficulty in walking, not elsewhere classified  Localized swelling, mass and lump, right lower limb     Problem List Patient Active Problem List   Diagnosis Date Noted  . Swelling of left knee joint 06/30/2016  . Acute pain of left knee 06/30/2016  . PCP NOTES >>>>>>>>>>>>>>>>>>>>>>>>>>>>>> 02/23/2015  . Pain in joint, shoulder region 09/10/2014  . Annual physical exam 01/15/2012  . Insomnia  11/25/2010  . DM II (diabetes mellitus, type II), controlled (HCC) 04/01/2010  . Hyperlipidemia 04/01/2010  . Essential hypertension 04/01/2010  . GERD 04/01/2010    Forde Radon, SPTA 08/10/2016, 11:52 AM  [facility] [place] [facility] [address] [place], [place], [address] Phone: [phone]   Fax:  [phone]  Name: Bruce Henson MRN: [id number] Date of Birth: Jun 22, 1965

## 2016-08-11 NOTE — Telephone Encounter (Signed)
Received negative Cologuard results. Report sent for scanning and copy mailed to Pt.

## 2016-08-13 ENCOUNTER — Ambulatory Visit: Payer: 59 | Admitting: Physical Therapy

## 2016-08-13 DIAGNOSIS — M25561 Pain in right knee: Secondary | ICD-10-CM

## 2016-08-13 DIAGNOSIS — M25661 Stiffness of right knee, not elsewhere classified: Secondary | ICD-10-CM

## 2016-08-13 DIAGNOSIS — R2241 Localized swelling, mass and lump, right lower limb: Secondary | ICD-10-CM

## 2016-08-13 NOTE — Therapy (Signed)
[facility] [place] [address] [place], [place], [address] Phone: [phone]   Fax:  [phone]  Physical Therapy Treatment  Patient Details  Name: Bruce Henson MRN: [id number] Date of Birth: 1965-12-14 Referring Provider: Serita Grit  Encounter Date: 08/13/2016      PT End of Session - 08/13/16 1223    Visit Number 4   Date for PT Re-Evaluation 10/04/16   PT Start Time 1137   PT Stop Time 1238   PT Time Calculation (min) 61 min   Activity Tolerance Patient tolerated treatment well   Behavior During Therapy [facility] for tasks assessed/performed      Past Medical History:  Diagnosis Date  . Diabetes mellitus 2005  . GERD (gastroesophageal reflux disease)    mild, uses OTCS  . Hyperlipidemia   . Hypertension     Past Surgical History:  Procedure Laterality Date  . KNEE ARTHROSCOPY W/ SYNOVECTOMY Right 07/2016    There were no vitals filed for this visit.      Subjective Assessment - 08/13/16 1139    Subjective Pt. reports no pain. Had a little pain after last treatment other than that he is doing well.    Currently in Pain? No/denies   Pain Score 0-No pain                         [facility] Adult PT Treatment/Exercise - 08/13/16 0001      High Level Balance   High Level Balance Activities SLS cone touches 4x10     Knee/Hip Exercises: Aerobic   Nustep Lvl 3 6 min     Knee/Hip Exercises: Machines for Strengthening   Cybex Knee Extension 15lbs 2x10   Cybex Knee Flexion 25lbs 2x10   Cybex Leg Press 60lb 2x15     Knee/Hip Exercises: Standing   Heel Raises 2 sets;15 reps   Lateral Step Up 1 set;10 reps   Forward Step Up 1 set;10 reps     Knee/Hip Exercises: Seated   Sit to Sand 2 sets;15 reps:7lbs      Knee/Hip Exercises: Supine   Straight Leg Raises 3 sets;10 reps   Straight Leg Raises Limitations RLE      Vasopneumatic   Number Minutes Vasopneumatic  15 minutes   Vasopnuematic  Location  Knee   Vasopneumatic Pressure Medium                  PT Short Term Goals - 08/04/16 1336      PT SHORT TERM GOAL #1   Title independent with initial HEP   Time 2   Period Weeks   Status New           PT Long Term Goals - 08/04/16 1336      PT LONG TERM GOAL #1   Title decrease pain 50%   Time 8   Period Weeks   Status New     PT LONG TERM GOAL #2   Title increase AROM of right knee to 0-115 degrees flexion   Time 8   Period Weeks   Status New     PT LONG TERM GOAL #3   Title be able to go up and down a ladder without difficulty   Time 8   Period Weeks   Status New     PT LONG TERM GOAL #4   Title increase right knee strength to 5/5   Time 8   Period Weeks  Status New               Plan - 08/13/16 1224    Clinical Impression Statement Pt tolerated exercises well. Some pain when doing fwd/lat step-up. Decrease swelling in R knee. LOBx4  during SLS cone touches. Pt CGA during SLS exercise. Pt reported slight pain only during exercises.   Rehab Potential Good   PT Treatment/Interventions ADLs/Self Care Home Management;Cryotherapy;Electrical Stimulation;Stair training;Gait training;Functional mobility training;Patient/family education;Therapeutic exercise;Balance training;Therapeutic activities;Manual techniques;Vasopneumatic Device   PT Next Visit Plan continue with strengthening and muscular endurance exercises.       Patient will benefit from skilled therapeutic intervention in order to improve the following deficits and impairments:  Abnormal gait, Decreased activity tolerance, Decreased balance, Decreased mobility, Decreased strength, Increased edema, Decreased range of motion, Difficulty walking, Pain, Impaired flexibility  Visit Diagnosis: Acute pain of right knee  Localized swelling, mass and lump, right lower limb  Stiffness of right knee, not elsewhere classified     Problem List Patient Active Problem List    Diagnosis Date Noted  . Swelling of left knee joint 06/30/2016  . Acute pain of left knee 06/30/2016  . PCP NOTES >>>>>>>>>>>>>>>>>>>>>>>>>>>>>> 02/23/2015  . Pain in joint, shoulder region 09/10/2014  . Annual physical exam 01/15/2012  . Insomnia 11/25/2010  . DM II (diabetes mellitus, type II), controlled (HCC) 04/01/2010  . Hyperlipidemia 04/01/2010  . Essential hypertension 04/01/2010  . GERD 04/01/2010    Bruce Henson 08/13/2016, 12:32 PM  [facility] [place] [address] [place], [place], [address] Phone: [phone]   Fax:  [phone]  Name: Bruce Henson MRN: [id number] Date of Birth: 1965/10/05

## 2016-08-19 ENCOUNTER — Ambulatory Visit: Payer: 59 | Admitting: Physical Therapy

## 2016-08-19 ENCOUNTER — Encounter: Payer: Self-pay | Admitting: Physical Therapy

## 2016-08-19 DIAGNOSIS — M25661 Stiffness of right knee, not elsewhere classified: Secondary | ICD-10-CM

## 2016-08-19 DIAGNOSIS — M25561 Pain in right knee: Secondary | ICD-10-CM | POA: Diagnosis not present

## 2016-08-19 DIAGNOSIS — R2241 Localized swelling, mass and lump, right lower limb: Secondary | ICD-10-CM

## 2016-08-19 DIAGNOSIS — R262 Difficulty in walking, not elsewhere classified: Secondary | ICD-10-CM

## 2016-08-19 NOTE — Therapy (Signed)
[facility] [place] [address] [place], [place], [address] Phone: [phone]   Fax:  [phone]  Physical Therapy Treatment  Patient Details  Name: Bruce Henson MRN: [id number] Date of Birth: [birth year] Referring Provider: Serita Grit  Encounter Date: 08/19/2016      PT End of Session - 08/19/16 0926    Visit Number 5   Date for PT Re-Evaluation 10/04/16   PT Start Time 0845   PT Stop Time 0939   PT Time Calculation (min) 54 min   Activity Tolerance Patient tolerated treatment well   Behavior During Therapy [facility] for tasks assessed/performed      Past Medical History:  Diagnosis Date  . Diabetes mellitus 2005  . GERD (gastroesophageal reflux disease)    mild, uses OTCS  . Hyperlipidemia   . Hypertension     Past Surgical History:  Procedure Laterality Date  . KNEE ARTHROSCOPY W/ SYNOVECTOMY Right 07/2016    There were no vitals filed for this visit.      Subjective Assessment - 08/19/16 0855    Subjective Pt. is doing well. Reports no pain this morning but stated that he did have a little pain after last treament.    Currently in Pain? No/denies   Pain Score 0-No pain                         [facility] Adult PT Treatment/Exercise - 08/19/16 0001      High Level Balance   High Level Balance Activities --  SLS on Airex 3x20sec     Knee/Hip Exercises: Stretches   Gastroc Stretch 3 reps;20 seconds;Right     Knee/Hip Exercises: Machines for Strengthening   Cybex Knee Extension 15lbs 2x10  up2/down1   Cybex Knee Flexion 25lbs 3x10   Cybex Leg Press 70lb 3x10     Knee/Hip Exercises: Standing   Heel Raises 2 sets;20 reps     Knee/Hip Exercises: Seated   Sit to Sand 3 sets;10 reps  4lbs     Knee/Hip Exercises: Supine   Straight Leg Raises 3 sets;10 reps   Straight Leg Raises Limitations RLE      Vasopneumatic   Number Minutes Vasopneumatic  15 minutes   Vasopnuematic Location  Knee    Vasopneumatic Pressure Medium                  PT Short Term Goals - 08/04/16 1336      PT SHORT TERM GOAL #1   Title independent with initial HEP   Time 2   Period Weeks   Status New           PT Long Term Goals - 08/04/16 1336      PT LONG TERM GOAL #1   Title decrease pain 50%   Time 8   Period Weeks   Status New     PT LONG TERM GOAL #2   Title increase AROM of right knee to 0-115 degrees flexion   Time 8   Period Weeks   Status New     PT LONG TERM GOAL #3   Title be able to go up and down a ladder without difficulty   Time 8   Period Weeks   Status New     PT LONG TERM GOAL #4   Title increase right knee strength to 5/5   Time 8   Period Weeks   Status New  Plan - 08/19/16 0927    Clinical Impression Statement Pt was able to complete all exercises. Increased resistance for on leg press to challenge pt. Pt reported slight pain during sqauts. CGA during SLS on Airex. Pt has decreased swelling in R knee.    Rehab Potential Good   PT Frequency 2x / week   PT Duration 8 weeks   PT Treatment/Interventions ADLs/Self Care Home Management;Cryotherapy;Electrical Stimulation;Stair training;Gait training;Functional mobility training;Patient/family education;Therapeutic exercise;Balance training;Therapeutic activities;Manual techniques;Vasopneumatic Device   PT Next Visit Plan continue with strengthening and muscular endurance exercises.       Patient will benefit from skilled therapeutic intervention in order to improve the following deficits and impairments:  Abnormal gait, Decreased activity tolerance, Decreased balance, Decreased mobility, Decreased strength, Increased edema, Decreased range of motion, Difficulty walking, Pain, Impaired flexibility  Visit Diagnosis: Acute pain of right knee  Localized swelling, mass and lump, right lower limb  Stiffness of right knee, not elsewhere classified  Difficulty in walking, not  elsewhere classified     Problem List Patient Active Problem List   Diagnosis Date Noted  . Swelling of left knee joint 06/30/2016  . Acute pain of left knee 06/30/2016  . PCP NOTES >>>>>>>>>>>>>>>>>>>>>>>>>>>>>> 02/23/2015  . Pain in joint, shoulder region 09/10/2014  . Annual physical exam 01/15/2012  . Insomnia 11/25/2010  . DM II (diabetes mellitus, type II), controlled (HCC) 04/01/2010  . Hyperlipidemia 04/01/2010  . Essential hypertension 04/01/2010  . GERD 04/01/2010    [name] 08/19/2016, 9:32 AM  [facility] [place] [address] [place], [place], [address] Phone: [phone]   Fax:  [phone]  Name: Bruce Henson MRN: [id number] Date of Birth: Dec 18, 1965

## 2016-08-21 ENCOUNTER — Ambulatory Visit: Payer: 59 | Admitting: Physical Therapy

## 2016-08-21 ENCOUNTER — Encounter: Payer: Self-pay | Admitting: Physical Therapy

## 2016-08-21 DIAGNOSIS — M25561 Pain in right knee: Secondary | ICD-10-CM | POA: Diagnosis not present

## 2016-08-21 DIAGNOSIS — R262 Difficulty in walking, not elsewhere classified: Secondary | ICD-10-CM

## 2016-08-21 DIAGNOSIS — R2241 Localized swelling, mass and lump, right lower limb: Secondary | ICD-10-CM

## 2016-08-21 DIAGNOSIS — M25661 Stiffness of right knee, not elsewhere classified: Secondary | ICD-10-CM

## 2016-08-21 NOTE — Therapy (Signed)
[place], [place], [address] Phone: [phone]   Fax:  [phone]  Physical Therapy Treatment  Patient Details  Name: Bruce Henson MRN: [id number] Date of Birth: 10-15-1965 Referring Provider: Wonda Horner  Encounter Date: 08/21/2016      PT End of Session - 08/21/16 1011    Visit Number 6   Date for PT Re-Evaluation 10/04/16   PT Start Time 0930   PT Stop Time 1024   PT Time Calculation (min) 54 min   Activity Tolerance Patient tolerated treatment well   Behavior During Therapy [facility] for tasks assessed/performed      Past Medical History:  Diagnosis Date  . Diabetes mellitus 2005  . GERD (gastroesophageal reflux disease)    mild, uses OTCS  . Hyperlipidemia   . Hypertension     Past Surgical History:  Procedure Laterality Date  . KNEE ARTHROSCOPY W/ SYNOVECTOMY Right 07/2016    There were no vitals filed for this visit.      Subjective Assessment - 08/21/16 0927    Subjective Pt is doing well today and reports no pain. No problems since last visit just a little soreness.   Patient Stated Goals go back to work without difficulty   Currently in Pain? No/denies   Pain Score 0-No pain            [facility] PT Assessment - 08/21/16 0001      AROM   Right Knee Extension 4   Right Knee Flexion 125                     [facility] Adult PT Treatment/Exercise - 08/21/16 0001      Knee/Hip Exercises: Stretches   Gastroc Stretch 3 reps;20 seconds;Right     Knee/Hip Exercises: Aerobic   Recumbent Bike Lvl 1 3 fwd/3back     Knee/Hip Exercises: Machines for Strengthening   Cybex Knee Extension 15lbs 2x15 2up/1 down    Cybex Knee Flexion 25lbs 2x15   Cybex Leg Press 30lb 2x10   Right leg only     Knee/Hip Exercises: Standing   Heel Raises 2 sets;20 reps   Hip Flexion 2 sets;10 reps  10lbs   Lateral Step Up 2 sets;15 reps;Step Height: 6"   Lateral Step Up Limitations  4lbs   Forward Step Up 2 sets;15 reps   Forward Step Up Limitations 4lb   Step Down Step Height: 6"     Knee/Hip Exercises: Seated   Sit to Sand 3 sets;10 reps;without UE support  4lbs     Vasopneumatic   Number Minutes Vasopneumatic  15 minutes   Vasopnuematic Location  Knee   Vasopneumatic Pressure Medium                  PT Short Term Goals - 08/04/16 1336      PT SHORT TERM GOAL #1   Title independent with initial HEP   Time 2   Period Weeks   Status New           PT Long Term Goals - 08/21/16 1020      PT LONG TERM GOAL #1   Title decrease pain 50%   Status Partially Met     PT LONG TERM GOAL #2   Title increase AROM of right knee to 0-115 degrees flexion   Status Partially Met               Plan -  08/21/16 1011    Clinical Impression Statement Pt was able to complete all exercises. Pt reported pain during sit to stands, lateral/forward step ups but said that the pain eased up as he continued the exercises. Pt required some VC to correct knees going over toes during sit to stands. Pt AROM for knee flexion was 4-125   Rehab Potential Good   PT Frequency 2x / week   PT Duration 8 weeks   PT Treatment/Interventions ADLs/Self Care Home Management;Cryotherapy;Electrical Stimulation;Stair training;Gait training;Functional mobility training;Patient/family education;Therapeutic exercise;Balance training;Therapeutic activities;Manual techniques;Vasopneumatic Device   PT Next Visit Plan continue with strengthening and muscular endurance exercises.    Consulted and Agree with Plan of Care Patient      Patient will benefit from skilled therapeutic intervention in order to improve the following deficits and impairments:  Abnormal gait, Decreased activity tolerance, Decreased balance, Decreased mobility, Decreased strength, Increased edema, Decreased range of motion, Difficulty walking, Pain, Impaired flexibility  Visit Diagnosis: Acute pain of right  knee  Localized swelling, mass and lump, right lower limb  Stiffness of right knee, not elsewhere classified  Difficulty in walking, not elsewhere classified     Problem List Patient Active Problem List   Diagnosis Date Noted  . Swelling of left knee joint 06/30/2016  . Acute pain of left knee 06/30/2016  . PCP NOTES >>>>>>>>>>>>>>>>>>>>>>>>>>>>>> 02/23/2015  . Pain in joint, shoulder region 09/10/2014  . Annual physical exam 01/15/2012  . Insomnia 11/25/2010  . DM II (diabetes mellitus, type II), controlled ([place]) 04/01/2010  . Hyperlipidemia 04/01/2010  . Essential hypertension 04/01/2010  . GERD 04/01/2010    Octavia Bruckner 08/21/2016, 10:31 AM  [place], [place], [address] Phone: [phone]   Fax:  [phone]  Name: Bruce Henson MRN: [id number] Date of Birth: 18-Nov-1965

## 2016-08-25 ENCOUNTER — Encounter: Payer: Self-pay | Admitting: Physical Therapy

## 2016-08-25 ENCOUNTER — Ambulatory Visit: Payer: 59 | Admitting: Physical Therapy

## 2016-08-25 DIAGNOSIS — M25661 Stiffness of right knee, not elsewhere classified: Secondary | ICD-10-CM

## 2016-08-25 DIAGNOSIS — R262 Difficulty in walking, not elsewhere classified: Secondary | ICD-10-CM

## 2016-08-25 DIAGNOSIS — M25561 Pain in right knee: Secondary | ICD-10-CM | POA: Diagnosis not present

## 2016-08-25 NOTE — Therapy (Signed)
[place], [place], [address] Phone: [phone]   Fax:  [phone]  Physical Therapy Treatment  Patient Details  Name: Bruce Henson MRN: [id number] Date of Birth: December 23, 1965 Referring Provider: Wonda Horner  Encounter Date: 08/25/2016      PT End of Session - 08/25/16 0913    Visit Number 7   Date for PT Re-Evaluation 10/04/16   PT Start Time 0830   PT Stop Time 0916   PT Time Calculation (min) 46 min   Activity Tolerance Patient tolerated treatment well   Behavior During Therapy [facility] for tasks assessed/performed      Past Medical History:  Diagnosis Date  . Diabetes mellitus 2005  . GERD (gastroesophageal reflux disease)    mild, uses OTCS  . Hyperlipidemia   . Hypertension     Past Surgical History:  Procedure Laterality Date  . KNEE ARTHROSCOPY W/ SYNOVECTOMY Right 07/2016    There were no vitals filed for this visit.      Subjective Assessment - 08/25/16 0835    Subjective Reports doing well, will see the MD on Monday.   Currently in Pain? No/denies                         [facility] Adult PT Treatment/Exercise - 08/25/16 0001      High Level Balance   High Level Balance Comments resisted gait all directions     Knee/Hip Exercises: Aerobic   Elliptical R=6 I=8 x 4 minutes   Recumbent Bike level 1 x 5 minutes   Nustep Lvl 3 6 min   Other Aerobic ladder climbs 4 rungs 6x     Knee/Hip Exercises: Machines for Strengthening   Cybex Knee Extension 15lbs 2x15, right leg eccentrics   Cybex Knee Flexion 45# 2x15   Cybex Leg Press 40# 2x10, tehn 40# right leg only                  PT Short Term Goals - 08/25/16 3329      PT SHORT TERM GOAL #1   Title independent with initial HEP   Status Achieved           PT Long Term Goals - 08/25/16 5188      PT LONG TERM GOAL #1   Title decrease pain 50%   Status Partially Met     PT LONG TERM  GOAL #2   Title increase AROM of right knee to 0-115 degrees flexion   Status Achieved     PT LONG TERM GOAL #3   Title be able to go up and down a ladder without difficulty   Status Achieved               Plan - 08/25/16 0913    Clinical Impression Statement Patient reports he will be seeing his MD on Monday, we tried to increase simulation of work activities like climbing ladder into equipment, he was able to do this without pain, he had some pain in the patellar area with resisted backward walking.  He was fatigued with todays treatment but no real pain.   PT Next Visit Plan assess return to work, send MD note   Consulted and Agree with Plan of Care Patient      Patient will benefit from skilled therapeutic intervention in order to improve the following deficits and impairments:     Visit Diagnosis: Acute pain of right  knee  Stiffness of right knee, not elsewhere classified  Difficulty in walking, not elsewhere classified     Problem List Patient Active Problem List   Diagnosis Date Noted  . Swelling of left knee joint 06/30/2016  . Acute pain of left knee 06/30/2016  . PCP NOTES >>>>>>>>>>>>>>>>>>>>>>>>>>>>>> 02/23/2015  . Pain in joint, shoulder region 09/10/2014  . Annual physical exam 01/15/2012  . Insomnia 11/25/2010  . DM II (diabetes mellitus, type II), controlled ([place]) 04/01/2010  . Hyperlipidemia 04/01/2010  . Essential hypertension 04/01/2010  . GERD 04/01/2010    Sumner Boast., PT 08/25/2016, 9:19 AM  [place] [address] [place], [place], [address] Phone: [phone]   Fax:  [phone]  Name: Bruce Henson MRN: [id number] Date of Birth: 06/15/1965

## 2016-08-27 ENCOUNTER — Encounter: Payer: Self-pay | Admitting: Physical Therapy

## 2016-08-27 ENCOUNTER — Ambulatory Visit: Payer: 59 | Admitting: Physical Therapy

## 2016-08-27 DIAGNOSIS — R2241 Localized swelling, mass and lump, right lower limb: Secondary | ICD-10-CM

## 2016-08-27 DIAGNOSIS — M25661 Stiffness of right knee, not elsewhere classified: Secondary | ICD-10-CM

## 2016-08-27 DIAGNOSIS — M25561 Pain in right knee: Secondary | ICD-10-CM | POA: Diagnosis not present

## 2016-08-27 NOTE — Therapy (Signed)
[place], [place], [address] Phone: [phone]   Fax:  [phone]  Physical Therapy Treatment  Patient Details  Name: TYLOR COURTWRIGHT MRN: [id number] Date of Birth: Aug 10, 1965 Referring Provider: Wonda Horner  Encounter Date: 08/27/2016      PT End of Session - 08/27/16 0917    Visit Number 7   Date for PT Re-Evaluation 10/04/16   PT Start Time 0830   PT Stop Time 0922   PT Time Calculation (min) 52 min   Activity Tolerance Patient tolerated treatment well   Behavior During Therapy [facility] for tasks assessed/performed      Past Medical History:  Diagnosis Date  . Diabetes mellitus 2005  . GERD (gastroesophageal reflux disease)    mild, uses OTCS  . Hyperlipidemia   . Hypertension     Past Surgical History:  Procedure Laterality Date  . KNEE ARTHROSCOPY W/ SYNOVECTOMY Right 07/2016    There were no vitals filed for this visit.      Subjective Assessment - 08/27/16 0837    Subjective Patient reports that he had a little bit of pain after last session that he would rate a 4/10. He reports that he has no pain or soreness today, he went on a mile walk this morning before he came here.    Currently in Pain? No/denies                         [facility] Adult PT Treatment/Exercise - 08/27/16 0001      Knee/Hip Exercises: Stretches   Gastroc Stretch 3 reps;30 seconds     Knee/Hip Exercises: Aerobic   Nustep Lvl 5 6 min LEs only   Other Aerobic ladder climbs 4 rungs 15x     Knee/Hip Exercises: Machines for Strengthening   Cybex Knee Extension 15lbs 2x15, right leg eccentrics   Cybex Knee Flexion 45# 2x15   Other Machine RLE push down 35# 3 x 10      Knee/Hip Exercises: Standing   SLS SL airex 3 x 30 sec   Other Standing Knee Exercises SL RDL 8# 1 x 8     Vasopneumatic   Number Minutes Vasopneumatic  15 minutes   Vasopnuematic Location  Knee   Vasopneumatic Pressure  Medium   Vasopneumatic Temperature  34                  PT Short Term Goals - 08/25/16 5093      PT SHORT TERM GOAL #1   Title independent with initial HEP   Status Achieved           PT Long Term Goals - 08/27/16 0909      PT LONG TERM GOAL #1   Title decrease pain 50%   Status Achieved     PT LONG TERM GOAL #2   Title increase AROM of right knee to 0-115 degrees flexion   Status Achieved     PT LONG TERM GOAL #3   Title be able to go up and down a ladder without difficulty   Status Achieved     PT LONG TERM GOAL #4   Title increase right knee strength to 5/5   Baseline Visible shaking with higher level activities   Status Partially Met               Plan - 08/27/16 0922    Clinical Impression Statement Patient reported that he  is seeing his MD on Monday with hopes to return to work on Tuesday. We increased the ladder climbing to 15 times to increase the climbing to a "good day's" amount, he handled it with no pain and without difficulty. The patient was shaking with SL balance exercises but handled them with no pain.    PT Next Visit Plan Patient will call after his MD appointment on Monday with decision to D/C or cont PT      Patient will benefit from skilled therapeutic intervention in order to improve the following deficits and impairments:  Abnormal gait, Decreased activity tolerance, Decreased balance, Decreased mobility, Decreased strength, Increased edema, Decreased range of motion, Difficulty walking, Pain, Impaired flexibility  Visit Diagnosis: Stiffness of right knee, not elsewhere classified  Localized swelling, mass and lump, right lower limb     Problem List Patient Active Problem List   Diagnosis Date Noted  . Swelling of left knee joint 06/30/2016  . Acute pain of left knee 06/30/2016  . PCP NOTES >>>>>>>>>>>>>>>>>>>>>>>>>>>>>> 02/23/2015  . Pain in joint, shoulder region 09/10/2014  . Annual physical exam 01/15/2012  .  Insomnia 11/25/2010  . DM II (diabetes mellitus, type II), controlled ([place]) 04/01/2010  . Hyperlipidemia 04/01/2010  . Essential hypertension 04/01/2010  . GERD 04/01/2010    Alan Mulder SPTA 08/27/2016, 9:29 AM  [place] [address] [place], [place], [address] Phone: [phone]   Fax:  [phone]  Name: TAYO MAUTE MRN: [id number] Date of Birth: 24-Nov-1965

## 2016-08-31 ENCOUNTER — Encounter (INDEPENDENT_AMBULATORY_CARE_PROVIDER_SITE_OTHER): Payer: Self-pay | Admitting: Orthopaedic Surgery

## 2016-08-31 ENCOUNTER — Ambulatory Visit (INDEPENDENT_AMBULATORY_CARE_PROVIDER_SITE_OTHER): Payer: Self-pay | Admitting: Orthopaedic Surgery

## 2016-08-31 VITALS — BP 141/88 | HR 94 | Ht 75.0 in | Wt 279.0 lb

## 2016-08-31 DIAGNOSIS — M25561 Pain in right knee: Secondary | ICD-10-CM

## 2016-08-31 DIAGNOSIS — G8929 Other chronic pain: Secondary | ICD-10-CM

## 2016-08-31 NOTE — Progress Notes (Signed)
Office Visit Note   Patient: Bruce Henson           Date of Birth: [birth year]           MRN: [id number] Visit Date: 08/31/2016              Requested by: Wanda Plump, MD [address] [place], [place] [address] PCP: Willow Ora, MD   Assessment & Plan: Visit Diagnoses:  1. Chronic pain of right knee   2 months status post arthroscopic debridement of right knee and open lateral release. His postoperative course was prolonged with recurrent effusions. On his last office visit I had aspirated his knee and injected cortisone Notes since that time he has done very well and would like to return to work on Monday, May 7  Plan: Note to return to work on May 7 without restrictions. Return as needed.  Follow-Up Instructions: Return if symptoms worsen or fail to improve.   Orders:  No orders of the defined types were placed in this encounter.  No orders of the defined types were placed in this encounter.     Procedures: No procedures performed   Clinical Data: No additional findings.   Subjective: Chief Complaint  Patient presents with  . Right Knee - Routine Post Op    Mr. Bruce Henson is 7 1/2 weeks status post right knee arthroscopic synovectomy and lateral release. No pain, swelling or tenderness. PT really helped  And she is very happy with his present course of like the therapy has made a big difference. He also relates that he does not have of  The pain that he had before  his surgery and  is very happy.  HPI  Review of Systems   Objective: Vital Signs: BP (!) 141/88   Pulse 94   Ht  (1.905 m)   Wt 279 lb (126.6 kg)   BMI 34.87 kg/m   Physical Exam  Ortho Exam right knee with positive patellar crepitation but no pain. No medial lateral joint discomfort. Full quick extension with no effusion. No popliteal pain. Flexes over 105 without instability. No distal edema. Walks without a limp. Able to get up and down from a low sitting position without a  problem.    Imaging: No results found.   PMFS History: Patient Active Problem List   Diagnosis Date Noted  . Swelling of left knee joint 06/30/2016  . Acute pain of left knee 06/30/2016  . PCP NOTES >>>>>>>>>>>>>>>>>>>>>>>>>>>>>> 02/23/2015  . Pain in joint, shoulder region 09/10/2014  . Annual physical exam 01/15/2012  . Insomnia 11/25/2010  . DM II (diabetes mellitus, type II), controlled (HCC) 04/01/2010  . Hyperlipidemia 04/01/2010  . Essential hypertension 04/01/2010  . GERD 04/01/2010   Past Medical History:  Diagnosis Date  . Diabetes mellitus 2005  . GERD (gastroesophageal reflux disease)    mild, uses OTCS  . Hyperlipidemia   . Hypertension     Family History  Problem Relation Age of Onset  . Heart disease Father     died at age 74  . Stroke Father   . Diabetes Father   . Hypertension Father   . Colon cancer Neg Hx   . Prostate cancer Neg Hx     Past Surgical History:  Procedure Laterality Date  . KNEE ARTHROSCOPY W/ SYNOVECTOMY Right 07/2016   Social History   Occupational History  . Heavy Arboriculturist    Social History Main Topics  .  Smoking status: Never Smoker  . Smokeless tobacco: Never Used  . Alcohol use Yes     Comment: socially  . Drug use: No  . Sexual activity: Not on file     Bruce Batman, MD   Note - This record has been created using [employer].  Chart creation errors have been sought, but may not always  have been located. Such creation errors do not reflect on  the standard of medical care.

## 2017-01-08 ENCOUNTER — Other Ambulatory Visit: Payer: Self-pay | Admitting: Internal Medicine

## 2017-01-19 ENCOUNTER — Telehealth: Payer: Self-pay | Admitting: Internal Medicine

## 2017-01-19 NOTE — Telephone Encounter (Signed)
Rx denied, Last OV 07/2016- was informed to return in 3-4 months. Pt overdue for appt. Needs appt before refills.

## 2017-01-19 NOTE — Telephone Encounter (Signed)
[facility] - [place], [place] - [phone] (Phone) [phone] (Fax)    Reason for call:  Pharmacy checking on the status of sildenafil (VIAGRA) 100 MG tablet request, pharmacy states 3x request,please advise

## 2017-01-19 NOTE — Telephone Encounter (Signed)
lvm advising patient of message below °

## 2017-01-19 NOTE — Telephone Encounter (Signed)
Patient returned call and states he will call back to schedule appointment.

## 2017-01-22 ENCOUNTER — Ambulatory Visit (INDEPENDENT_AMBULATORY_CARE_PROVIDER_SITE_OTHER): Payer: 59 | Admitting: Internal Medicine

## 2017-01-22 ENCOUNTER — Encounter: Payer: Self-pay | Admitting: Internal Medicine

## 2017-01-22 VITALS — BP 128/78 | HR 68 | Temp 98.0°F | Resp 14 | Ht 75.0 in | Wt 279.1 lb

## 2017-01-22 DIAGNOSIS — Z125 Encounter for screening for malignant neoplasm of prostate: Secondary | ICD-10-CM

## 2017-01-22 DIAGNOSIS — E118 Type 2 diabetes mellitus with unspecified complications: Secondary | ICD-10-CM | POA: Diagnosis not present

## 2017-01-22 DIAGNOSIS — I10 Essential (primary) hypertension: Secondary | ICD-10-CM | POA: Diagnosis not present

## 2017-01-22 DIAGNOSIS — E785 Hyperlipidemia, unspecified: Secondary | ICD-10-CM | POA: Diagnosis not present

## 2017-01-22 LAB — COMPREHENSIVE METABOLIC PANEL
ALK PHOS: 58 U/L (ref 39–117)
ALT: 30 U/L (ref 0–53)
AST: 19 U/L (ref 0–37)
Albumin: 4 g/dL (ref 3.5–5.2)
BUN: 8 mg/dL (ref 6–23)
CO2: 32 meq/L (ref 19–32)
CREATININE: 0.83 mg/dL (ref 0.40–1.50)
Calcium: 9 mg/dL (ref 8.4–10.5)
Chloride: 102 mEq/L (ref 96–112)
GFR: 125.62 mL/min (ref >60.00)
GLUCOSE: 136 mg/dL — AB (ref 70–99)
Potassium: 4.3 mEq/L (ref 3.5–5.1)
Sodium: 141 mEq/L (ref 135–145)
TOTAL PROTEIN: 6.5 g/dL (ref 6.0–8.3)
Total Bilirubin: 0.6 mg/dL (ref 0.2–1.2)

## 2017-01-22 LAB — LIPID PANEL
CHOL/HDL RATIO: 3
Cholesterol: 147 mg/dL (ref 0–200)
HDL: 52.2 mg/dL (ref >39.00)
LDL Cholesterol: 79 mg/dL (ref 0–99)
NONHDL: 94.72
Triglycerides: 80 mg/dL (ref 0.0–149.0)
VLDL: 16 mg/dL (ref 0.0–40.0)

## 2017-01-22 LAB — PSA: PSA: 0.37 ng/mL (ref 0.10–4.00)

## 2017-01-22 LAB — TSH: TSH: 0.83 u[IU]/mL (ref 0.35–4.50)

## 2017-01-22 LAB — HEMOGLOBIN A1C: HEMOGLOBIN A1C: 6.7 % — AB (ref 4.6–6.5)

## 2017-01-22 MED ORDER — ZOLPIDEM TARTRATE 10 MG PO TABS: 10.0000 mg | ORAL_TABLET | Freq: Every evening | ORAL | 0 refills | Status: DC | PRN

## 2017-01-22 MED ORDER — SILDENAFIL CITRATE 100 MG PO TABS: 50.0000 mg | ORAL_TABLET | Freq: Every day | ORAL | 3 refills | Status: AC | PRN

## 2017-01-22 NOTE — Patient Instructions (Signed)
GO TO THE LAB : Get the blood work     GO TO THE FRONT DESK Schedule your next appointment for a  physical exam in 6 months  

## 2017-01-22 NOTE — Progress Notes (Signed)
Pre visit review using our clinic review tool, if applicable. No additional management support is needed unless otherwise documented below in the visit note. 

## 2017-01-22 NOTE — Progress Notes (Signed)
Subjective:    Patient ID: Bruce Henson, male    DOB: 1965-06-19, 51 y.o.   MRN: [id number]  DOS:  01/22/2017 Type of visit - description : rov Interval history: In general feeling well. DM: Good compliance of medication, no ambulatory blood sugars HTN:on  Metoprolol, HCTZ. Good compliance, no ambulatory BPs Insomnia: takes meds sporadically, needs a refill. ED: Uses Viagra with good results   Review of Systems Denies chest pain or difficulty breathing No nausea, vomiting, diarrhea. No dysuria or gross hematuria  Past Medical History:  Diagnosis Date  . Diabetes mellitus 2005  . GERD (gastroesophageal reflux disease)    mild, uses OTCS  . Hyperlipidemia   . Hypertension     Past Surgical History:  Procedure Laterality Date  . KNEE ARTHROSCOPY W/ SYNOVECTOMY Right 07/2016    Social History   Social History  . Marital status: Single    Spouse name: N/A  . Number of children: 2  . Years of education: N/A   Occupational History  . Heavy Arboriculturist    Social History Main Topics  . Smoking status: Never Smoker  . Smokeless tobacco: Never Used  . Alcohol use Yes     Comment: socially  . Drug use: No  . Sexual activity: Not on file   Other Topics Concern  . Not on file   Social History Narrative   Divorced, remarried, lives w/ wife      Allergies as of 01/22/2017   No Known Allergies     Medication List       Accurate as of 01/22/17  9:55 AM. Always use your most recent med list.          aspirin 81 MG tablet Take 81 mg by mouth daily.   hydrochlorothiazide 25 MG tablet Commonly known as:  HYDRODIURIL Take 1 tablet (25 mg total) by mouth daily.   ibuprofen 200 MG tablet Commonly known as:  ADVIL,MOTRIN Take 600 mg by mouth every 6 (six) hours as needed for moderate pain.   metFORMIN 1000 MG tablet Commonly known as:  GLUCOPHAGE Take 1 tablet (1,000 mg total) by mouth 2 (two) times daily with a meal.   metoprolol succinate 25 MG 24  hr tablet Commonly known as:  TOPROL-XL Take 1 tablet (25 mg total) by mouth daily.   oxyCODONE-acetaminophen 5-325 MG tablet Commonly known as:  PERCOCET/ROXICET TK 1-2 TS PO Q 3-6 H PRN. MAX OF 12 TS D.   pioglitazone 30 MG tablet Commonly known as:  ACTOS Take 1 tablet (30 mg total) by mouth daily.   pravastatin 20 MG tablet Commonly known as:  PRAVACHOL Take 1 tablet (20 mg total) by mouth at bedtime.   ranitidine 300 MG tablet Commonly known as:  ZANTAC Take 1 tablet (300 mg total) by mouth at bedtime.   sildenafil 100 MG tablet Commonly known as:  VIAGRA Take 0.5-1 tablets (50-100 mg total) by mouth daily as needed for erectile dysfunction.   sitaGLIPtin 100 MG tablet Commonly known as:  JANUVIA Take 1 tablet (100 mg total) by mouth daily.   traMADol 50 MG tablet Commonly known as:  ULTRAM Take 1-2 tablets bid prn   zolpidem 10 MG tablet Commonly known as:  AMBIEN Take 1 tablet (10 mg total) by mouth at bedtime as needed for sleep.          Objective:   Physical Exam BP 128/78 (BP Location: Left Arm, Patient Position: Sitting, Cuff Size: Normal)   Pulse  68   Temp 98 F (36.7 C) (Oral)   Resp 14   Ht  (1.905 m)   Wt 279 lb 2 oz (126.6 kg)   SpO2 96%   BMI 34.89 kg/m  General:   Well developed, well nourished . NAD.  HEENT:  Normocephalic . Face symmetric, atraumatic Lungs:  CTA B Normal respiratory effort, no intercostal retractions, no accessory muscle use. Heart: RRR,  no murmur.  no pretibial edema bilaterally  Abdomen:  Not distended, soft, non-tender. No rebound or rigidity.  Skin: Not pale. Not jaundice DIABETIC FEET EXAM: No lower extremity edema Normal pedal pulses bilaterally Skin normal, nails normal, no calluses Pinprick examination of the feet normal. DRE: Declined Neurologic:  alert & oriented X3.  Speech normal, gait appropriate for age and unassisted Psych--  Cognition and judgment appear intact.  Cooperative with  normal attention span and concentration.  Behavior appropriate. No anxious or depressed appearing.    Assessment & Plan:  Assessment>  DM 2005 HTN Hyperlipidemia GERD  Insomnia E.D.   PLAN: DM: Continue metformin, Actos, Januvia. Foot exam negative today. Check A1c and TSH. Diet and exercise discussed  HTN: On HCTZ and metoprolol. Check a CMP. High cholesterol: On Pravachol. Check a FLP. Primary care: Declined a flu shot. Declined a DRE, check a PSA; cologuard (-) 07-2016 RTC 6 months, CPX

## 2017-01-22 NOTE — Progress Notes (Signed)
Rx for Ambien  faxed to [facility].

## 2017-01-23 NOTE — Assessment & Plan Note (Signed)
DM: Continue metformin, Actos, Januvia. Foot exam negative today. Check A1c and TSH. Diet and exercise discussed  HTN: On HCTZ and metoprolol. Check a CMP. High cholesterol: On Pravachol. Check a FLP. Primary care: Declined a flu shot. Declined a DRE, check a PSA; cologuard (-) 07-2016 RTC 6 months, CPX

## 2017-01-27 ENCOUNTER — Telehealth: Payer: Self-pay

## 2017-01-27 NOTE — Telephone Encounter (Signed)
PA approved through 01/22/2018. [id number].

## 2017-07-19 ENCOUNTER — Telehealth: Payer: Self-pay | Admitting: Internal Medicine

## 2017-07-21 NOTE — Telephone Encounter (Signed)
Sent!

## 2017-07-21 NOTE — Telephone Encounter (Signed)
Pt is requesting refill on Ambien  Last OV: 01/22/2017 Last Fill: 01/22/2017 #90 and 0RF UDS: Not needed for Ambien  NCCR printed- no issues noted  Please advise.

## 2017-08-18 ENCOUNTER — Other Ambulatory Visit: Payer: Self-pay | Admitting: Internal Medicine

## 2017-10-27 ENCOUNTER — Ambulatory Visit: Payer: 59 | Admitting: Internal Medicine

## 2017-10-27 ENCOUNTER — Encounter: Payer: Self-pay | Admitting: Internal Medicine

## 2017-10-27 ENCOUNTER — Other Ambulatory Visit (INDEPENDENT_AMBULATORY_CARE_PROVIDER_SITE_OTHER): Payer: 59

## 2017-10-27 VITALS — BP 132/80 | HR 66 | Temp 97.8°F | Resp 16 | Ht 75.0 in | Wt 269.0 lb

## 2017-10-27 DIAGNOSIS — I10 Essential (primary) hypertension: Secondary | ICD-10-CM

## 2017-10-27 DIAGNOSIS — E118 Type 2 diabetes mellitus with unspecified complications: Secondary | ICD-10-CM | POA: Diagnosis not present

## 2017-10-27 DIAGNOSIS — D509 Iron deficiency anemia, unspecified: Secondary | ICD-10-CM | POA: Diagnosis not present

## 2017-10-27 LAB — CBC WITH DIFFERENTIAL/PLATELET
BASOS PCT: 0.5 % (ref 0.0–3.0)
Basophils Absolute: 0 10*3/uL (ref 0.0–0.1)
EOS ABS: 0.2 10*3/uL (ref 0.0–0.7)
Eosinophils Relative: 4.3 % (ref 0.0–5.0)
HCT: 38.2 % — ABNORMAL LOW (ref 39.0–52.0)
HEMOGLOBIN: 12.6 g/dL — AB (ref 13.0–17.0)
Lymphocytes Relative: 38.6 % (ref 12.0–46.0)
Lymphs Abs: 1.7 10*3/uL (ref 0.7–4.0)
MCHC: 33 g/dL (ref 30.0–36.0)
MCV: 88.9 fl (ref 78.0–100.0)
MONO ABS: 0.5 10*3/uL (ref 0.1–1.0)
Monocytes Relative: 10 % (ref 3.0–12.0)
Neutro Abs: 2.1 10*3/uL (ref 1.4–7.7)
Neutrophils Relative %: 46.6 % (ref 43.0–77.0)
Platelets: 209 10*3/uL (ref 150.0–400.0)
RBC: 4.3 Mil/uL (ref 4.22–5.81)
RDW: 12.6 % (ref 11.5–15.5)
WBC: 4.5 10*3/uL (ref 4.0–10.5)

## 2017-10-27 LAB — HEMOGLOBIN A1C: Hgb A1c MFr Bld: 6.6 % — ABNORMAL HIGH (ref 4.6–6.5)

## 2017-10-27 LAB — BASIC METABOLIC PANEL WITH GFR
BUN: 9 mg/dL (ref 6–23)
CO2: 32 meq/L (ref 19–32)
Calcium: 9 mg/dL (ref 8.4–10.5)
Chloride: 99 meq/L (ref 96–112)
Creatinine, Ser: 0.89 mg/dL (ref 0.40–1.50)
GFR: 115.55 mL/min
Glucose, Bld: 124 mg/dL — ABNORMAL HIGH (ref 70–99)
Potassium: 4 meq/L (ref 3.5–5.1)
Sodium: 141 meq/L (ref 135–145)

## 2017-10-27 LAB — HM DIABETES EYE EXAM

## 2017-10-27 LAB — MICROALBUMIN / CREATININE URINE RATIO
Creatinine,U: 127.9 mg/dL
Microalb Creat Ratio: 0.5 mg/g (ref 0.0–30.0)

## 2017-10-27 LAB — FERRITIN: FERRITIN: 203.5 ng/mL (ref 22.0–322.0)

## 2017-10-27 NOTE — Progress Notes (Signed)
Subjective:    Patient ID: Bruce Henson, male    DOB: Nov 08, 1965, 52 y.o.   MRN: [id number]  DOS:  10/27/2017 Type of visit - description : f/u Interval history: Good compliance with medication, no ambulatory CBGs or BPs.  Wt Readings from Last 3 Encounters:  10/27/17 269 lb (122 kg)  01/22/17 279 lb 2 oz (126.6 kg)  08/31/16 279 lb (126.6 kg)     Review of Systems  Denies nausea, vomiting, diarrhea. No difficulty urinating.  Past Medical History:  Diagnosis Date  . Diabetes mellitus 2005  . GERD (gastroesophageal reflux disease)    mild, uses OTCS  . Hyperlipidemia   . Hypertension     Past Surgical History:  Procedure Laterality Date  . KNEE ARTHROSCOPY W/ SYNOVECTOMY Right 07/2016    Social History   Socioeconomic History  . Marital status: Single    Spouse name: Not on file  . Number of children: 2  . Years of education: Not on file  . Highest education level: Not on file  Occupational History  . Occupation: Psychologist, forensicHeavy Equipment Operator  Social Needs  . Financial resource strain: Not on file  . Food insecurity:    Worry: Not on file    Inability: Not on file  . Transportation needs:    Medical: Not on file    Non-medical: Not on file  Tobacco Use  . Smoking status: Never Smoker  . Smokeless tobacco: Never Used  Substance and Sexual Activity  . Alcohol use: Yes    Comment: socially  . Drug use: No  . Sexual activity: Not on file  Lifestyle  . Physical activity:    Days per week: Not on file    Minutes per session: Not on file  . Stress: Not on file  Relationships  . Social connections:    Talks on phone: Not on file    Gets together: Not on file    Attends religious service: Not on file    Active member of club or organization: Not on file    Attends meetings of clubs or organizations: Not on file    Relationship status: Not on file  . Intimate partner violence:    Fear of current or ex partner: Not on file    Emotionally abused: Not on  file    Physically abused: Not on file    Forced sexual activity: Not on file  Other Topics Concern  . Not on file  Social History Narrative   Divorced, remarried, lives w/ wife      Allergies as of 10/27/2017   No Known Allergies     Medication List        Accurate as of 10/27/17  6:52 PM. Always use your most recent med list.          aspirin 81 MG tablet Take 81 mg by mouth daily.   hydrochlorothiazide 25 MG tablet Commonly known as:  HYDRODIURIL Take 1 tablet (25 mg total) by mouth daily.   ibuprofen 200 MG tablet Commonly known as:  ADVIL,MOTRIN Take 600 mg by mouth every 6 (six) hours as needed for moderate pain.   metFORMIN 1000 MG tablet Commonly known as:  GLUCOPHAGE Take 1 tablet (1,000 mg total) by mouth 2 (two) times daily with a meal.   metoprolol succinate 25 MG 24 hr tablet Commonly known as:  TOPROL-XL Take 1 tablet (25 mg total) by mouth daily.   pioglitazone 30 MG tablet Commonly known as:  ACTOS  Take 1 tablet (30 mg total) by mouth daily.   pravastatin 20 MG tablet Commonly known as:  PRAVACHOL Take 1 tablet (20 mg total) by mouth at bedtime.   ranitidine 300 MG tablet Commonly known as:  ZANTAC Take 1 tablet (300 mg total) by mouth at bedtime.   sildenafil 100 MG tablet Commonly known as:  VIAGRA Take 0.5-1 tablets (50-100 mg total) by mouth daily as needed for erectile dysfunction.   sitaGLIPtin 100 MG tablet Commonly known as:  JANUVIA Take 1 tablet (100 mg total) by mouth daily.   zolpidem 10 MG tablet Commonly known as:  AMBIEN Take 1 tablet (10 mg total) by mouth at bedtime as needed for sleep.          Objective:   Physical Exam BP 132/80 (BP Location: Left Arm, Patient Position: Sitting, Cuff Size: Normal)   Pulse 66   Temp 97.8 F (36.6 C) (Oral)   Resp 16   Ht 6\' 3"  (1.905 m)   Wt 269 lb (122 kg)   SpO2 97%   BMI 33.62 kg/m  General:   Well developed, NAD, see BMI.  HEENT:  Normocephalic . Face symmetric,  atraumatic Lungs:  CTA B Normal respiratory effort, no intercostal retractions, no accessory muscle use. Heart: RRR,  no murmur.  No pretibial edema bilaterally  Skin: Not pale. Not jaundice Neurologic:  alert & oriented X3.  Speech normal, gait appropriate for age and unassisted Psych--  Cognition and judgment appear intact.  Cooperative with normal attention span and concentration.  Behavior appropriate. No anxious or depressed appearing.      Assessment & Plan:    Assessment>  DM 2005 HTN Hyperlipidemia GERD  Insomnia E.D.   PLAN DM: Continue metformin, Actos, Januvia.  He is eating healthier, more active, has lost weight. Praised! Check A1c and micro.  Has an eye exam today.  Reluctant to start checking ambulatory CBGs Hyperlipidemia: Well-controlled on Pravachol HTN: On metoprolol and hydrochlorothiazide.  Recommend ambulatory BPs, check a BMP and CBC GERD: Symptoms controlled Insomnia: Uses Ambien very rarely Preventive care reviewed RTC 6 months

## 2017-10-27 NOTE — Assessment & Plan Note (Addendum)
Td 2011 Cologuard NEG 07-2016 PSA normal 01-2017, declined a DRE

## 2017-10-27 NOTE — Progress Notes (Signed)
Pre visit review using our clinic review tool, if applicable. No additional management support is needed unless otherwise documented below in the visit note. 

## 2017-10-27 NOTE — Patient Instructions (Signed)
GO TO THE LAB : Get the blood work     GO TO THE FRONT DESK Schedule your next appointment for a  Check up in 6 months     Check the  blood pressure   Monthly  Be sure your blood pressure is between 110/65 and  135/85. If it is consistently higher or lower, let me know

## 2017-10-27 NOTE — Assessment & Plan Note (Signed)
DM: Continue metformin, Actos, Januvia.  He is eating healthier, more active, has lost weight. Praised! Check A1c and micro.  Has an eye exam today.  Reluctant to start checking ambulatory CBGs Hyperlipidemia: Well-controlled on Pravachol HTN: On metoprolol and hydrochlorothiazide.  Recommend ambulatory BPs, check a BMP and CBC GERD: Symptoms controlled Insomnia: Uses Ambien very rarely Preventive care reviewed RTC 6 months

## 2017-10-28 LAB — IRON: IRON: 151 ug/dL (ref 42–165)

## 2017-10-29 ENCOUNTER — Ambulatory Visit: Payer: 59 | Admitting: Internal Medicine

## 2018-01-10 ENCOUNTER — Other Ambulatory Visit: Payer: Self-pay | Admitting: Internal Medicine

## 2018-01-20 ENCOUNTER — Ambulatory Visit: Payer: 59 | Admitting: Internal Medicine

## 2018-01-20 DIAGNOSIS — Z0289 Encounter for other administrative examinations: Secondary | ICD-10-CM

## 2018-02-15 ENCOUNTER — Encounter: Payer: Self-pay | Admitting: Internal Medicine

## 2018-04-30 ENCOUNTER — Encounter (HOSPITAL_COMMUNITY): Payer: Self-pay | Admitting: Anesthesiology

## 2018-04-30 ENCOUNTER — Inpatient Hospital Stay: Admit: 2018-04-30 | Payer: Self-pay | Admitting: Orthopaedic Surgery

## 2018-04-30 ENCOUNTER — Emergency Department (HOSPITAL_COMMUNITY): Payer: 59

## 2018-04-30 ENCOUNTER — Inpatient Hospital Stay (HOSPITAL_COMMUNITY)
Admission: RE | Admit: 2018-04-30 | Discharge: 2018-05-01 | Disposition: A | Discharge status: Still In Hospital | Payer: 59 | Source: Ambulatory Visit | Attending: General Surgery | Admitting: General Surgery

## 2018-04-30 ENCOUNTER — Encounter (HOSPITAL_COMMUNITY)
Admission: RE | Disposition: A | Discharge status: Still In Hospital | Payer: Self-pay | Source: Ambulatory Visit | Attending: General Surgery

## 2018-04-30 ENCOUNTER — Emergency Department (HOSPITAL_COMMUNITY): Payer: 59 | Admitting: Anesthesiology

## 2018-04-30 ENCOUNTER — Encounter (HOSPITAL_COMMUNITY): Admission: EM | Disposition: E | Payer: Self-pay | Source: Home / Self Care

## 2018-04-30 DIAGNOSIS — E785 Hyperlipidemia, unspecified: Secondary | ICD-10-CM | POA: Diagnosis present

## 2018-04-30 DIAGNOSIS — S36523A Contusion of sigmoid colon, initial encounter: Secondary | ICD-10-CM | POA: Diagnosis present

## 2018-04-30 DIAGNOSIS — K7200 Acute and subacute hepatic failure without coma: Secondary | ICD-10-CM | POA: Diagnosis not present

## 2018-04-30 DIAGNOSIS — S7011XA Contusion of right thigh, initial encounter: Secondary | ICD-10-CM | POA: Diagnosis present

## 2018-04-30 DIAGNOSIS — G4700 Insomnia, unspecified: Secondary | ICD-10-CM | POA: Diagnosis present

## 2018-04-30 DIAGNOSIS — Z7984 Long term (current) use of oral hypoglycemic drugs: Secondary | ICD-10-CM

## 2018-04-30 DIAGNOSIS — Z66 Do not resuscitate: Secondary | ICD-10-CM | POA: Diagnosis present

## 2018-04-30 DIAGNOSIS — S72351A Displaced comminuted fracture of shaft of right femur, initial encounter for closed fracture: Secondary | ICD-10-CM | POA: Diagnosis not present

## 2018-04-30 DIAGNOSIS — G4733 Obstructive sleep apnea (adult) (pediatric): Secondary | ICD-10-CM | POA: Diagnosis present

## 2018-04-30 DIAGNOSIS — A419 Sepsis, unspecified organism: Secondary | ICD-10-CM | POA: Diagnosis not present

## 2018-04-30 DIAGNOSIS — S42201A Unspecified fracture of upper end of right humerus, initial encounter for closed fracture: Secondary | ICD-10-CM | POA: Diagnosis present

## 2018-04-30 DIAGNOSIS — E1152 Type 2 diabetes mellitus with diabetic peripheral angiopathy with gangrene: Secondary | ICD-10-CM | POA: Diagnosis present

## 2018-04-30 DIAGNOSIS — J189 Pneumonia, unspecified organism: Secondary | ICD-10-CM | POA: Diagnosis present

## 2018-04-30 DIAGNOSIS — S81812A Laceration without foreign body, left lower leg, initial encounter: Secondary | ICD-10-CM | POA: Diagnosis present

## 2018-04-30 DIAGNOSIS — S72401A Unspecified fracture of lower end of right femur, initial encounter for closed fracture: Secondary | ICD-10-CM | POA: Diagnosis present

## 2018-04-30 DIAGNOSIS — S332XXA Dislocation of sacroiliac and sacrococcygeal joint, initial encounter: Secondary | ICD-10-CM | POA: Diagnosis present

## 2018-04-30 DIAGNOSIS — M6282 Rhabdomyolysis: Secondary | ICD-10-CM | POA: Diagnosis present

## 2018-04-30 DIAGNOSIS — M47812 Spondylosis without myelopathy or radiculopathy, cervical region: Secondary | ICD-10-CM | POA: Diagnosis present

## 2018-04-30 DIAGNOSIS — S42102A Fracture of unspecified part of scapula, left shoulder, initial encounter for closed fracture: Secondary | ICD-10-CM | POA: Diagnosis present

## 2018-04-30 DIAGNOSIS — R578 Other shock: Secondary | ICD-10-CM | POA: Diagnosis present

## 2018-04-30 DIAGNOSIS — R609 Edema, unspecified: Secondary | ICD-10-CM | POA: Diagnosis present

## 2018-04-30 DIAGNOSIS — R402232 Coma scale, best verbal response, inappropriate words, at arrival to emergency department: Secondary | ICD-10-CM | POA: Diagnosis present

## 2018-04-30 DIAGNOSIS — R402342 Coma scale, best motor response, flexion withdrawal, at arrival to emergency department: Secondary | ICD-10-CM | POA: Diagnosis present

## 2018-04-30 DIAGNOSIS — Z9911 Dependence on respirator [ventilator] status: Secondary | ICD-10-CM

## 2018-04-30 DIAGNOSIS — I272 Pulmonary hypertension, unspecified: Secondary | ICD-10-CM | POA: Diagnosis present

## 2018-04-30 DIAGNOSIS — T79A3XA Traumatic compartment syndrome of abdomen, initial encounter: Secondary | ICD-10-CM | POA: Diagnosis present

## 2018-04-30 DIAGNOSIS — I10 Essential (primary) hypertension: Secondary | ICD-10-CM | POA: Diagnosis present

## 2018-04-30 DIAGNOSIS — E872 Acidosis: Secondary | ICD-10-CM | POA: Diagnosis present

## 2018-04-30 DIAGNOSIS — Y92410 Unspecified street and highway as the place of occurrence of the external cause: Secondary | ICD-10-CM

## 2018-04-30 DIAGNOSIS — G9341 Metabolic encephalopathy: Secondary | ICD-10-CM | POA: Diagnosis present

## 2018-04-30 DIAGNOSIS — D62 Acute posthemorrhagic anemia: Secondary | ICD-10-CM | POA: Diagnosis present

## 2018-04-30 DIAGNOSIS — Z79899 Other long term (current) drug therapy: Secondary | ICD-10-CM

## 2018-04-30 DIAGNOSIS — E1122 Type 2 diabetes mellitus with diabetic chronic kidney disease: Secondary | ICD-10-CM | POA: Diagnosis present

## 2018-04-30 DIAGNOSIS — S2231XA Fracture of one rib, right side, initial encounter for closed fracture: Secondary | ICD-10-CM | POA: Diagnosis present

## 2018-04-30 DIAGNOSIS — S32810A Multiple fractures of pelvis with stable disruption of pelvic ring, initial encounter for closed fracture: Secondary | ICD-10-CM | POA: Diagnosis present

## 2018-04-30 DIAGNOSIS — J80 Acute respiratory distress syndrome: Secondary | ICD-10-CM | POA: Diagnosis not present

## 2018-04-30 DIAGNOSIS — S27321A Contusion of lung, unilateral, initial encounter: Secondary | ICD-10-CM | POA: Diagnosis present

## 2018-04-30 DIAGNOSIS — E875 Hyperkalemia: Secondary | ICD-10-CM | POA: Diagnosis not present

## 2018-04-30 DIAGNOSIS — R571 Hypovolemic shock: Secondary | ICD-10-CM | POA: Diagnosis present

## 2018-04-30 DIAGNOSIS — D696 Thrombocytopenia, unspecified: Secondary | ICD-10-CM | POA: Diagnosis not present

## 2018-04-30 DIAGNOSIS — E11649 Type 2 diabetes mellitus with hypoglycemia without coma: Secondary | ICD-10-CM | POA: Diagnosis present

## 2018-04-30 DIAGNOSIS — S36428A Contusion of other part of small intestine, initial encounter: Secondary | ICD-10-CM | POA: Diagnosis present

## 2018-04-30 DIAGNOSIS — N170 Acute kidney failure with tubular necrosis: Secondary | ICD-10-CM | POA: Diagnosis not present

## 2018-04-30 DIAGNOSIS — Z9289 Personal history of other medical treatment: Secondary | ICD-10-CM

## 2018-04-30 DIAGNOSIS — R402122 Coma scale, eyes open, to pain, at arrival to emergency department: Secondary | ICD-10-CM | POA: Diagnosis present

## 2018-04-30 DIAGNOSIS — E781 Pure hyperglyceridemia: Secondary | ICD-10-CM | POA: Diagnosis present

## 2018-04-30 DIAGNOSIS — S301XXA Contusion of abdominal wall, initial encounter: Secondary | ICD-10-CM | POA: Diagnosis present

## 2018-04-30 DIAGNOSIS — Z6841 Body Mass Index (BMI) 40.0 and over, adult: Secondary | ICD-10-CM

## 2018-04-30 DIAGNOSIS — J9811 Atelectasis: Secondary | ICD-10-CM | POA: Diagnosis not present

## 2018-04-30 DIAGNOSIS — S22051A Stable burst fracture of T5-T6 vertebra, initial encounter for closed fracture: Secondary | ICD-10-CM | POA: Diagnosis present

## 2018-04-30 DIAGNOSIS — S3210XA Unspecified fracture of sacrum, initial encounter for closed fracture: Secondary | ICD-10-CM | POA: Diagnosis present

## 2018-04-30 DIAGNOSIS — T80818A Extravasation of other vesicant agent, initial encounter: Secondary | ICD-10-CM | POA: Diagnosis present

## 2018-04-30 DIAGNOSIS — R6521 Severe sepsis with septic shock: Secondary | ICD-10-CM | POA: Diagnosis not present

## 2018-04-30 DIAGNOSIS — T1490XA Injury, unspecified, initial encounter: Secondary | ICD-10-CM | POA: Diagnosis not present

## 2018-04-30 HISTORY — PX: IR ANGIOGRAM SELECTIVE EACH ADDITIONAL VESSEL: IMG667

## 2018-04-30 HISTORY — PX: IR US GUIDE VASC ACCESS LEFT: IMG2389

## 2018-04-30 HISTORY — PX: LAPAROTOMY: SHX154

## 2018-04-30 HISTORY — PX: RADIOLOGY WITH ANESTHESIA: SHX6223

## 2018-04-30 HISTORY — PX: IR ANGIOGRAM PELVIS SELECTIVE OR SUPRASELECTIVE: IMG661

## 2018-04-30 HISTORY — PX: IR ANGIOGRAM EXTREMITY LEFT: IMG651

## 2018-04-30 HISTORY — PX: IR EMBO ART  VEN HEMORR LYMPH EXTRAV  INC GUIDE ROADMAPPING: IMG5450

## 2018-04-30 LAB — I-STAT CHEM 8, ED
BUN: 15 mg/dL (ref 6–20)
CHLORIDE: 105 mmol/L (ref 98–111)
Calcium, Ion: 1.05 mmol/L — ABNORMAL LOW (ref 1.15–1.40)
Creatinine, Ser: 1.3 mg/dL — ABNORMAL HIGH (ref 0.61–1.24)
Glucose, Bld: 299 mg/dL — ABNORMAL HIGH (ref 70–99)
HCT: 39 % (ref 39.0–52.0)
Hemoglobin: 13.3 g/dL (ref 13.0–17.0)
POTASSIUM: 3 mmol/L — AB (ref 3.5–5.1)
Sodium: 143 mmol/L (ref 135–145)
TCO2: 17 mmol/L — ABNORMAL LOW (ref 22–32)

## 2018-04-30 LAB — COMPREHENSIVE METABOLIC PANEL
ALT: 74 U/L — ABNORMAL HIGH (ref 0–44)
AST: 132 U/L — ABNORMAL HIGH (ref 15–41)
Albumin: 3.5 g/dL (ref 3.5–5.0)
Alkaline Phosphatase: 56 U/L (ref 38–126)
Anion gap: 25 — ABNORMAL HIGH (ref 5–15)
BUN: 13 mg/dL (ref 6–20)
CALCIUM: 8.8 mg/dL — AB (ref 8.9–10.3)
CO2: 14 mmol/L — ABNORMAL LOW (ref 22–32)
Chloride: 105 mmol/L (ref 98–111)
Creatinine, Ser: 1.39 mg/dL — ABNORMAL HIGH (ref 0.61–1.24)
GFR calc non Af Amer: 58 mL/min — ABNORMAL LOW (ref >60)
Glucose, Bld: 314 mg/dL — ABNORMAL HIGH (ref 70–99)
Potassium: 3.4 mmol/L — ABNORMAL LOW (ref 3.5–5.1)
Sodium: 144 mmol/L (ref 135–145)
Total Bilirubin: 1.4 mg/dL — ABNORMAL HIGH (ref 0.3–1.2)
Total Protein: 6.4 g/dL — ABNORMAL LOW (ref 6.5–8.1)

## 2018-04-30 LAB — CBC
HEMATOCRIT: 41.1 % (ref 39.0–52.0)
Hemoglobin: 12 g/dL — ABNORMAL LOW (ref 13.0–17.0)
MCH: 28.5 pg (ref 26.0–34.0)
MCHC: 29.2 g/dL — ABNORMAL LOW (ref 30.0–36.0)
MCV: 97.6 fL (ref 80.0–100.0)
NRBC: 0.3 % — AB (ref 0.0–0.2)
Platelets: 198 10*3/uL (ref 150–400)
RBC: 4.21 MIL/uL — ABNORMAL LOW (ref 4.22–5.81)
RDW: 12.4 % (ref 11.5–15.5)
WBC: 6.9 10*3/uL (ref 4.0–10.5)

## 2018-04-30 LAB — DIC (DISSEMINATED INTRAVASCULAR COAGULATION)PANEL
D-Dimer, Quant: >20 ug/mL-FEU — ABNORMAL HIGH (ref 0.00–0.50)
Fibrinogen: 190 mg/dL — ABNORMAL LOW (ref 210–475)
INR: 1.26
Platelets: 197 10*3/uL (ref 150–400)
Smear Review: NONE SEEN
aPTT: 36 seconds (ref 24–36)

## 2018-04-30 LAB — I-STAT CG4 LACTIC ACID, ED: Lactic Acid, Venous: 15.26 mmol/L (ref 0.5–1.9)

## 2018-04-30 LAB — CDS SEROLOGY

## 2018-04-30 LAB — ETHANOL: Alcohol, Ethyl (B): 48 mg/dL — ABNORMAL HIGH (ref <10)

## 2018-04-30 LAB — DIC (DISSEMINATED INTRAVASCULAR COAGULATION) PANEL: PROTHROMBIN TIME: 15.6 s — AB (ref 11.4–15.2)

## 2018-04-30 SURGERY — EXTERNAL FIXATION, PELVIS
Anesthesia: General

## 2018-04-30 SURGERY — IR WITH ANESTHESIA
Anesthesia: General

## 2018-04-30 SURGERY — LAPAROTOMY, EXPLORATORY
Anesthesia: General | Site: Abdomen

## 2018-04-30 MED ORDER — IOHEXOL 300 MG/ML  SOLN
150.0000 mL | Freq: Once | INTRAMUSCULAR | Status: AC | PRN
  Administered 2018-04-30: 25 mL via INTRAVENOUS

## 2018-04-30 MED ORDER — ROCURONIUM BROMIDE 50 MG/5ML IV SOLN
INTRAVENOUS | Status: AC | PRN
  Administered 2018-04-30: 100 mg via INTRAVENOUS

## 2018-04-30 MED ORDER — FENTANYL CITRATE (PF) 250 MCG/5ML IJ SOLN
INTRAMUSCULAR | Status: AC
  Filled 2018-04-30: qty 5

## 2018-04-30 MED ORDER — SODIUM CHLORIDE 0.9 % IV SOLN
INTRAVENOUS | Status: DC | PRN
  Administered 2018-04-30 (×6): via INTRAVENOUS

## 2018-04-30 MED ORDER — MIDAZOLAM HCL 2 MG/2ML IJ SOLN
INTRAMUSCULAR | Status: AC
  Filled 2018-04-30: qty 2

## 2018-04-30 MED ORDER — IOHEXOL 300 MG/ML  SOLN
150.0000 mL | Freq: Once | INTRAMUSCULAR | Status: AC | PRN
  Administered 2018-04-30: 75 mL via INTRAVENOUS

## 2018-04-30 MED ORDER — FENTANYL CITRATE (PF) 100 MCG/2ML IJ SOLN: 50.0000 ug | Freq: Once | INTRAMUSCULAR | Status: DC

## 2018-04-30 MED ORDER — FENTANYL CITRATE (PF) 100 MCG/2ML IJ SOLN
INTRAMUSCULAR | Status: AC
  Filled 2018-04-30: qty 2

## 2018-04-30 MED ORDER — VASOPRESSIN 20 UNIT/ML IV SOLN
INTRAVENOUS | Status: AC
  Filled 2018-04-30: qty 1

## 2018-04-30 MED ORDER — LACTATED RINGERS IV SOLN
INTRAVENOUS | Status: AC | PRN
  Administered 2018-04-30: 999 mL via INTRAVENOUS

## 2018-04-30 MED ORDER — CALCIUM CHLORIDE 10 % IV SOLN
INTRAVENOUS | Status: DC | PRN
  Administered 2018-04-30 (×3): 100 mg via INTRAVENOUS
  Administered 2018-04-30: 500 mg via INTRAVENOUS
  Administered 2018-04-30: 200 mg via INTRAVENOUS
  Administered 2018-04-30 (×2): 500 mg via INTRAVENOUS

## 2018-04-30 MED ORDER — ETOMIDATE 2 MG/ML IV SOLN
INTRAVENOUS | Status: AC | PRN
  Administered 2018-04-30: 20 mg via INTRAVENOUS

## 2018-04-30 MED ORDER — GELATIN ABSORBABLE 12-7 MM EX MISC
CUTANEOUS | Status: AC
  Filled 2018-04-30: qty 3

## 2018-04-30 MED ORDER — PROPOFOL 500 MG/50ML IV EMUL
INTRAVENOUS | Status: DC | PRN
  Administered 2018-04-30: 50 ug/kg/min via INTRAVENOUS

## 2018-04-30 MED ORDER — CEFAZOLIN SODIUM-DEXTROSE 2-4 GM/100ML-% IV SOLN
2.0000 g | Freq: Once | INTRAVENOUS | Status: AC
  Administered 2018-05-06: 2 g via INTRAVENOUS

## 2018-04-30 MED ORDER — LIDOCAINE HCL 1 % IJ SOLN
INTRAMUSCULAR | Status: AC
  Filled 2018-04-30: qty 20

## 2018-04-30 MED ORDER — ROCURONIUM BROMIDE 50 MG/5ML IV SOSY
PREFILLED_SYRINGE | INTRAVENOUS | Status: DC | PRN
  Administered 2018-04-30 (×4): 50 mg via INTRAVENOUS

## 2018-04-30 MED ORDER — SODIUM CHLORIDE 0.9 % IV SOLN
INTRAVENOUS | Status: DC | PRN
  Administered 2018-04-30: 100 ug/min via INTRAVENOUS

## 2018-04-30 MED ORDER — FENTANYL CITRATE (PF) 100 MCG/2ML IJ SOLN
INTRAMUSCULAR | Status: DC | PRN
  Administered 2018-04-30: 50 ug via INTRAVENOUS
  Administered 2018-04-30: 25 ug via INTRAVENOUS
  Administered 2018-04-30 (×2): 50 ug via INTRAVENOUS
  Administered 2018-04-30: 25 ug via INTRAVENOUS

## 2018-04-30 MED ORDER — SODIUM CHLORIDE 0.9 % IV SOLN
INTRAVENOUS | Status: AC | PRN
  Administered 2018-04-30: 1000 mL via INTRAVENOUS

## 2018-04-30 MED ORDER — CEFAZOLIN SODIUM-DEXTROSE 2-3 GM-%(50ML) IV SOLR
INTRAVENOUS | Status: DC | PRN
  Administered 2018-04-30: 2 g via INTRAVENOUS

## 2018-04-30 MED ORDER — SODIUM BICARBONATE 8.4 % IV SOLN
INTRAVENOUS | Status: DC | PRN
  Administered 2018-04-30: 50 meq via INTRAVENOUS

## 2018-04-30 MED ORDER — MIDAZOLAM HCL 2 MG/2ML IJ SOLN
INTRAMUSCULAR | Status: DC | PRN
  Administered 2018-04-30: 2 mg via INTRAVENOUS
  Administered 2018-04-30 (×2): 1 mg via INTRAVENOUS
  Administered 2018-04-30: 2 mg via INTRAVENOUS

## 2018-04-30 MED ORDER — TETANUS-DIPHTH-ACELL PERTUSSIS 5-2.5-18.5 LF-MCG/0.5 IM SUSP: 0.5000 mL | Freq: Once | INTRAMUSCULAR | Status: DC

## 2018-04-30 MED ORDER — IOHEXOL 300 MG/ML  SOLN
100.0000 mL | Freq: Once | INTRAMUSCULAR | Status: AC | PRN
  Administered 2018-04-30: 100 mL via INTRAVENOUS

## 2018-04-30 SURGICAL SUPPLY — 29 items
BANDAGE ELASTIC 4 VELCRO ST LF (GAUZE/BANDAGES/DRESSINGS) ×3 IMPLANT
BLADE CLIPPER SURG (BLADE) ×3 IMPLANT
BNDG GAUZE ELAST 4 BULKY (GAUZE/BANDAGES/DRESSINGS) ×3 IMPLANT
COVER SURGICAL LIGHT HANDLE (MISCELLANEOUS) ×3 IMPLANT
DRAPE LAPAROSCOPIC ABDOMINAL (DRAPES) ×3 IMPLANT
DRAPE WARM FLUID 44X44 (DRAPE) ×3 IMPLANT
DURAPREP 26ML APPLICATOR (WOUND CARE) ×3 IMPLANT
ELECT BLADE 6.5 EXT (BLADE) ×3 IMPLANT
ELECT CAUTERY BLADE 6.4 (BLADE) ×3 IMPLANT
ELECT REM PT RETURN 9FT ADLT (ELECTROSURGICAL) ×3
ELECTRODE REM PT RTRN 9FT ADLT (ELECTROSURGICAL) ×1 IMPLANT
GLOVE BIO SURGEON STRL SZ7 (GLOVE) ×6 IMPLANT
GLOVE BIOGEL PI IND STRL 7.5 (GLOVE) ×4 IMPLANT
GLOVE BIOGEL PI INDICATOR 7.5 (GLOVE) ×8
GOWN STRL REUS W/ TWL LRG LVL3 (GOWN DISPOSABLE) ×2 IMPLANT
GOWN STRL REUS W/TWL LRG LVL3 (GOWN DISPOSABLE) ×4
KIT BASIN OR (CUSTOM PROCEDURE TRAY) ×3 IMPLANT
KIT TURNOVER KIT B (KITS) ×3 IMPLANT
NS IRRIG 1000ML POUR BTL (IV SOLUTION) ×6 IMPLANT
PACK GENERAL/GYN (CUSTOM PROCEDURE TRAY) ×3 IMPLANT
PAD ABD 8X10 STRL (GAUZE/BANDAGES/DRESSINGS) ×3 IMPLANT
PAD ARMBOARD 7.5X6 YLW CONV (MISCELLANEOUS) ×3 IMPLANT
PAD NEG PRESSURE SENSATRAC (MISCELLANEOUS) ×3 IMPLANT
SPONGE LAP 18X18 RF (DISPOSABLE) ×6 IMPLANT
SPONGE LAP 18X18 X RAY DECT (DISPOSABLE) ×6 IMPLANT
STAPLER VISISTAT 35W (STAPLE) ×3 IMPLANT
SUCTION POOLE TIP (SUCTIONS) ×3 IMPLANT
TOWEL OR 17X26 10 PK STRL BLUE (TOWEL DISPOSABLE) ×3 IMPLANT
WND VAC CANISTER 500ML (MISCELLANEOUS) ×3 IMPLANT

## 2018-04-30 NOTE — ED Notes (Signed)
[facility] Dr Cliffton AstersWhite

## 2018-04-30 NOTE — Progress Notes (Signed)
Pulled Endotracheal tube back 2cm to 25 cm at the lip.

## 2018-04-30 NOTE — ED Notes (Signed)
C collar applied while maintaining stability.
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
## 2018-04-30 NOTE — ED Notes (Signed)
Main lab called stated that we need to recollect blood to run cmp.  Main lab stated only 1ml in tube and they cannot run blood test with that little amount of blood.  I will let the nurse know.
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
## 2018-04-30 NOTE — ED Notes (Signed)
I Stat [place] Acid of 15.26 reported to Dr. Silverio LayYao
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
## 2018-04-30 NOTE — Anesthesia Preprocedure Evaluation (Signed)
Anesthesia Evaluation  Patient identified by MRN, date of birth, ID band Patient unresponsive    Reviewed: Unable to perform ROS - Chart review onlyPreop documentation limited or incomplete due to emergent nature of procedure.  Airway Mallampati: Intubated       Dental  (+) Teeth Intact   Pulmonary     + decreased breath sounds      Cardiovascular hypertension, Pt. on home beta blockers  Rhythm:Regular Rate:Tachycardia     Neuro/Psych    GI/Hepatic   Endo/Other  diabetes, Type 2, Oral Hypoglycemic Agents  Renal/GU      Musculoskeletal   Abdominal   Peds  Hematology   Anesthesia Other Findings   Reproductive/Obstetrics                            Anesthesia Physical Anesthesia Plan  ASA: IV and emergent  Anesthesia Plan: General   Post-op Pain Management:    Induction: Intravenous  PONV Risk Score and Plan: Treatment may vary due to age or medical condition  Airway Management Planned:   Additional Equipment: Arterial line and CVP  Intra-op Plan:   Post-operative Plan: Post-operative intubation/ventilation  Informed Consent: I have reviewed the patients History and Physical, chart, labs and discussed the procedure including the risks, benefits and alternatives for the proposed anesthesia with the patient or authorized representative who has indicated his/her understanding and acceptance.   Only emergency history available and History available from chart only  Plan Discussed with: CRNA  Anesthesia Plan Comments:         Anesthesia Quick Evaluation
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
## 2018-04-30 NOTE — ED Notes (Signed)
Pelvic binder applied while maintaining stability.

## 2018-05-01 ENCOUNTER — Inpatient Hospital Stay (HOSPITAL_COMMUNITY): Payer: 59

## 2018-05-01 ENCOUNTER — Inpatient Hospital Stay (HOSPITAL_COMMUNITY)
Admission: EM | Admit: 2018-05-01 | Discharge: 2018-06-04 | DRG: 956 | Disposition: E | Payer: 59 | Attending: Student | Admitting: Student

## 2018-05-01 ENCOUNTER — Encounter (HOSPITAL_COMMUNITY): Payer: Self-pay | Admitting: Pulmonary Disease

## 2018-05-01 DIAGNOSIS — Z66 Do not resuscitate: Secondary | ICD-10-CM | POA: Diagnosis present

## 2018-05-01 DIAGNOSIS — S42201A Unspecified fracture of upper end of right humerus, initial encounter for closed fracture: Secondary | ICD-10-CM

## 2018-05-01 DIAGNOSIS — I96 Gangrene, not elsewhere classified: Secondary | ICD-10-CM | POA: Diagnosis not present

## 2018-05-01 DIAGNOSIS — E1152 Type 2 diabetes mellitus with diabetic peripheral angiopathy with gangrene: Secondary | ICD-10-CM | POA: Diagnosis present

## 2018-05-01 DIAGNOSIS — S2231XA Fracture of one rib, right side, initial encounter for closed fracture: Secondary | ICD-10-CM

## 2018-05-01 DIAGNOSIS — Z9289 Personal history of other medical treatment: Secondary | ICD-10-CM

## 2018-05-01 DIAGNOSIS — Z419 Encounter for procedure for purposes other than remedying health state, unspecified: Secondary | ICD-10-CM

## 2018-05-01 DIAGNOSIS — A419 Sepsis, unspecified organism: Secondary | ICD-10-CM | POA: Diagnosis not present

## 2018-05-01 DIAGNOSIS — T79A3XA Traumatic compartment syndrome of abdomen, initial encounter: Secondary | ICD-10-CM | POA: Diagnosis present

## 2018-05-01 DIAGNOSIS — N170 Acute kidney failure with tubular necrosis: Secondary | ICD-10-CM | POA: Diagnosis not present

## 2018-05-01 DIAGNOSIS — R579 Shock, unspecified: Secondary | ICD-10-CM | POA: Diagnosis not present

## 2018-05-01 DIAGNOSIS — S334XXA Traumatic rupture of symphysis pubis, initial encounter: Secondary | ICD-10-CM

## 2018-05-01 DIAGNOSIS — J9601 Acute respiratory failure with hypoxia: Secondary | ICD-10-CM

## 2018-05-01 DIAGNOSIS — S32810A Multiple fractures of pelvis with stable disruption of pelvic ring, initial encounter for closed fracture: Secondary | ICD-10-CM | POA: Diagnosis present

## 2018-05-01 DIAGNOSIS — Z0189 Encounter for other specified special examinations: Secondary | ICD-10-CM

## 2018-05-01 DIAGNOSIS — T80818A Extravasation of other vesicant agent, initial encounter: Secondary | ICD-10-CM | POA: Diagnosis present

## 2018-05-01 DIAGNOSIS — R945 Abnormal results of liver function studies: Secondary | ICD-10-CM

## 2018-05-01 DIAGNOSIS — R402122 Coma scale, eyes open, to pain, at arrival to emergency department: Secondary | ICD-10-CM | POA: Diagnosis present

## 2018-05-01 DIAGNOSIS — S81812A Laceration without foreign body, left lower leg, initial encounter: Secondary | ICD-10-CM

## 2018-05-01 DIAGNOSIS — S42309A Unspecified fracture of shaft of humerus, unspecified arm, initial encounter for closed fracture: Secondary | ICD-10-CM

## 2018-05-01 DIAGNOSIS — S72351A Displaced comminuted fracture of shaft of right femur, initial encounter for closed fracture: Secondary | ICD-10-CM | POA: Diagnosis present

## 2018-05-01 DIAGNOSIS — J189 Pneumonia, unspecified organism: Secondary | ICD-10-CM | POA: Diagnosis present

## 2018-05-01 DIAGNOSIS — D62 Acute posthemorrhagic anemia: Secondary | ICD-10-CM | POA: Diagnosis present

## 2018-05-01 DIAGNOSIS — G9341 Metabolic encephalopathy: Secondary | ICD-10-CM | POA: Diagnosis present

## 2018-05-01 DIAGNOSIS — J9621 Acute and chronic respiratory failure with hypoxia: Secondary | ICD-10-CM | POA: Diagnosis not present

## 2018-05-01 DIAGNOSIS — S329XXA Fracture of unspecified parts of lumbosacral spine and pelvis, initial encounter for closed fracture: Secondary | ICD-10-CM

## 2018-05-01 DIAGNOSIS — E119 Type 2 diabetes mellitus without complications: Secondary | ICD-10-CM | POA: Diagnosis not present

## 2018-05-01 DIAGNOSIS — J80 Acute respiratory distress syndrome: Secondary | ICD-10-CM | POA: Diagnosis not present

## 2018-05-01 DIAGNOSIS — S3210XA Unspecified fracture of sacrum, initial encounter for closed fracture: Secondary | ICD-10-CM | POA: Diagnosis present

## 2018-05-01 DIAGNOSIS — R7989 Other specified abnormal findings of blood chemistry: Secondary | ICD-10-CM

## 2018-05-01 DIAGNOSIS — Z978 Presence of other specified devices: Secondary | ICD-10-CM

## 2018-05-01 DIAGNOSIS — S332XXA Dislocation of sacroiliac and sacrococcygeal joint, initial encounter: Secondary | ICD-10-CM

## 2018-05-01 DIAGNOSIS — S27321A Contusion of lung, unilateral, initial encounter: Secondary | ICD-10-CM | POA: Diagnosis present

## 2018-05-01 DIAGNOSIS — Z789 Other specified health status: Secondary | ICD-10-CM

## 2018-05-01 DIAGNOSIS — S22051A Stable burst fracture of T5-T6 vertebra, initial encounter for closed fracture: Secondary | ICD-10-CM | POA: Diagnosis present

## 2018-05-01 DIAGNOSIS — R6521 Severe sepsis with septic shock: Secondary | ICD-10-CM | POA: Diagnosis not present

## 2018-05-01 DIAGNOSIS — M6282 Rhabdomyolysis: Secondary | ICD-10-CM | POA: Diagnosis present

## 2018-05-01 DIAGNOSIS — E8779 Other fluid overload: Secondary | ICD-10-CM | POA: Diagnosis not present

## 2018-05-01 DIAGNOSIS — Z9911 Dependence on respirator [ventilator] status: Secondary | ICD-10-CM | POA: Diagnosis not present

## 2018-05-01 DIAGNOSIS — E872 Acidosis: Secondary | ICD-10-CM | POA: Diagnosis present

## 2018-05-01 DIAGNOSIS — Y92410 Unspecified street and highway as the place of occurrence of the external cause: Secondary | ICD-10-CM | POA: Diagnosis not present

## 2018-05-01 DIAGNOSIS — I10 Essential (primary) hypertension: Secondary | ICD-10-CM | POA: Diagnosis not present

## 2018-05-01 DIAGNOSIS — N179 Acute kidney failure, unspecified: Secondary | ICD-10-CM | POA: Diagnosis not present

## 2018-05-01 DIAGNOSIS — T1490XA Injury, unspecified, initial encounter: Secondary | ICD-10-CM

## 2018-05-01 DIAGNOSIS — Z09 Encounter for follow-up examination after completed treatment for conditions other than malignant neoplasm: Secondary | ICD-10-CM

## 2018-05-01 DIAGNOSIS — R58 Hemorrhage, not elsewhere classified: Secondary | ICD-10-CM

## 2018-05-01 DIAGNOSIS — I723 Aneurysm of iliac artery: Secondary | ICD-10-CM

## 2018-05-01 DIAGNOSIS — K7200 Acute and subacute hepatic failure without coma: Secondary | ICD-10-CM | POA: Diagnosis not present

## 2018-05-01 DIAGNOSIS — S22009A Unspecified fracture of unspecified thoracic vertebra, initial encounter for closed fracture: Secondary | ICD-10-CM

## 2018-05-01 DIAGNOSIS — Z01818 Encounter for other preprocedural examination: Secondary | ICD-10-CM

## 2018-05-01 DIAGNOSIS — Z452 Encounter for adjustment and management of vascular access device: Secondary | ICD-10-CM

## 2018-05-01 DIAGNOSIS — R402342 Coma scale, best motor response, flexion withdrawal, at arrival to emergency department: Secondary | ICD-10-CM | POA: Diagnosis present

## 2018-05-01 DIAGNOSIS — N501 Vascular disorders of male genital organs: Secondary | ICD-10-CM

## 2018-05-01 DIAGNOSIS — J9690 Respiratory failure, unspecified, unspecified whether with hypoxia or hypercapnia: Secondary | ICD-10-CM

## 2018-05-01 HISTORY — DX: Type 2 diabetes mellitus without complications: E11.9

## 2018-05-01 HISTORY — DX: Hyperlipidemia, unspecified: E78.5

## 2018-05-01 HISTORY — DX: Obstructive sleep apnea (adult) (pediatric): G47.33

## 2018-05-01 HISTORY — DX: Insomnia, unspecified: G47.00

## 2018-05-01 LAB — MRSA PCR SCREENING: MRSA by PCR: NEGATIVE

## 2018-05-01 LAB — GLUCOSE, CAPILLARY
GLUCOSE-CAPILLARY: 155 mg/dL — AB (ref 70–99)
Glucose-Capillary: 133 mg/dL — ABNORMAL HIGH (ref 70–99)
Glucose-Capillary: 134 mg/dL — ABNORMAL HIGH (ref 70–99)
Glucose-Capillary: 138 mg/dL — ABNORMAL HIGH (ref 70–99)
Glucose-Capillary: 138 mg/dL — ABNORMAL HIGH (ref 70–99)
Glucose-Capillary: 139 mg/dL — ABNORMAL HIGH (ref 70–99)
Glucose-Capillary: 144 mg/dL — ABNORMAL HIGH (ref 70–99)

## 2018-05-01 LAB — CBC WITH DIFFERENTIAL/PLATELET
Abs Immature Granulocytes: 0.06 10*3/uL (ref 0.00–0.07)
Basophils Absolute: 0 10*3/uL (ref 0.0–0.1)
Basophils Relative: 0 %
EOS PCT: 0 %
Eosinophils Absolute: 0 10*3/uL (ref 0.0–0.5)
HEMATOCRIT: 36.7 % — AB (ref 39.0–52.0)
Hemoglobin: 13 g/dL (ref 13.0–17.0)
Immature Granulocytes: 1 %
Lymphocytes Relative: 11 %
Lymphs Abs: 1 10*3/uL (ref 0.7–4.0)
MCH: 29.6 pg (ref 26.0–34.0)
MCHC: 35.4 g/dL (ref 30.0–36.0)
MCV: 83.6 fL (ref 80.0–100.0)
Monocytes Absolute: 0.6 10*3/uL (ref 0.1–1.0)
Monocytes Relative: 7 %
Neutro Abs: 7.3 10*3/uL (ref 1.7–7.7)
Neutrophils Relative %: 81 %
Platelets: 62 10*3/uL — ABNORMAL LOW (ref 150–400)
RBC: 4.39 MIL/uL (ref 4.22–5.81)
RDW: 15.4 % (ref 11.5–15.5)
WBC: 9 10*3/uL (ref 4.0–10.5)
nRBC: 0.6 % — ABNORMAL HIGH (ref 0.0–0.2)

## 2018-05-01 LAB — PREPARE PLATELET PHERESIS
Unit division: 0
Unit division: 0
Unit division: 0

## 2018-05-01 LAB — BASIC METABOLIC PANEL
Anion gap: 16 — ABNORMAL HIGH (ref 5–15)
Anion gap: 15 (ref 5–15)
BUN: 14 mg/dL (ref 6–20)
BUN: 17 mg/dL (ref 6–20)
CHLORIDE: 112 mmol/L — AB (ref 98–111)
CO2: 14 mmol/L — ABNORMAL LOW (ref 22–32)
CO2: 16 mmol/L — ABNORMAL LOW (ref 22–32)
Calcium: 6.9 mg/dL — ABNORMAL LOW (ref 8.9–10.3)
Calcium: 7 mg/dL — ABNORMAL LOW (ref 8.9–10.3)
Chloride: 114 mmol/L — ABNORMAL HIGH (ref 98–111)
Creatinine, Ser: 1.34 mg/dL — ABNORMAL HIGH (ref 0.61–1.24)
Creatinine, Ser: 1.55 mg/dL — ABNORMAL HIGH (ref 0.61–1.24)
GFR calc Af Amer: >60 mL/min (ref >60)
GFR calc Af Amer: 59 mL/min — ABNORMAL LOW (ref >60)
GFR calc non Af Amer: >60 mL/min (ref >60)
GFR calc non Af Amer: 51 mL/min — ABNORMAL LOW (ref >60)
Glucose, Bld: 200 mg/dL — ABNORMAL HIGH (ref 70–99)
Glucose, Bld: 171 mg/dL — ABNORMAL HIGH (ref 70–99)
Potassium: 4.2 mmol/L (ref 3.5–5.1)
Potassium: 3.9 mmol/L (ref 3.5–5.1)
SODIUM: 143 mmol/L (ref 135–145)
Sodium: 144 mmol/L (ref 135–145)

## 2018-05-01 LAB — DIC (DISSEMINATED INTRAVASCULAR COAGULATION)PANEL
D-Dimer, Quant: >20 ug/mL-FEU — ABNORMAL HIGH (ref 0.00–0.50)
D-Dimer, Quant: >20 ug/mL-FEU — ABNORMAL HIGH (ref 0.00–0.50)
Fibrinogen: 165 mg/dL — ABNORMAL LOW (ref 210–475)
Fibrinogen: 227 mg/dL (ref 210–475)
INR: 1.62
Platelets: 40 10*3/uL — ABNORMAL LOW (ref 150–400)
Platelets: 76 10*3/uL — ABNORMAL LOW (ref 150–400)
Prothrombin Time: 19.1 seconds — ABNORMAL HIGH (ref 11.4–15.2)
Prothrombin Time: 16.5 seconds — ABNORMAL HIGH (ref 11.4–15.2)
Smear Review: NONE SEEN
Smear Review: NONE SEEN
aPTT: 46 seconds — ABNORMAL HIGH (ref 24–36)
aPTT: 34 seconds (ref 24–36)

## 2018-05-01 LAB — POCT I-STAT 3, ART BLOOD GAS (G3+)
Acid-base deficit: 8 mmol/L — ABNORMAL HIGH (ref 0.0–2.0)
Bicarbonate: 16 mmol/L — ABNORMAL LOW (ref 20.0–28.0)
O2 Saturation: 100 %
PCO2 ART: 28.9 mmHg — AB (ref 32.0–48.0)
PH ART: 7.353 (ref 7.350–7.450)
Patient temperature: 37
TCO2: 17 mmol/L — ABNORMAL LOW (ref 22–32)
pO2, Arterial: 265 mmHg — ABNORMAL HIGH (ref 83.0–108.0)

## 2018-05-01 LAB — PREPARE CRYOPRECIPITATE
Unit division: 0
Unit division: 0

## 2018-05-01 LAB — DIC (DISSEMINATED INTRAVASCULAR COAGULATION) PANEL: INR: 1.35

## 2018-05-01 LAB — BPAM CRYOPRECIPITATE
BLOOD PRODUCT EXPIRATION DATE: 201912290130
Blood Product Expiration Date: 201912290235
ISSUE DATE / TIME: 201912281958
ISSUE DATE / TIME: 201912282119
Unit Type and Rh: 5100
Unit Type and Rh: 5100

## 2018-05-01 LAB — BPAM PLATELET PHERESIS
Blood Product Expiration Date: 201912292359
Blood Product Expiration Date: 201912292359
Blood Product Expiration Date: 201912312359
ISSUE DATE / TIME: 201912281941
ISSUE DATE / TIME: 201912281941
ISSUE DATE / TIME: 201912282154
UNIT TYPE AND RH: 5100
Unit Type and Rh: 5100
Unit Type and Rh: 7300

## 2018-05-01 LAB — TRIGLYCERIDES: Triglycerides: 375 mg/dL — ABNORMAL HIGH (ref <150)

## 2018-05-01 LAB — CBC
HCT: 37 % — ABNORMAL LOW (ref 39.0–52.0)
HCT: 35.3 % — ABNORMAL LOW (ref 39.0–52.0)
HEMOGLOBIN: 13 g/dL (ref 13.0–17.0)
Hemoglobin: 12.5 g/dL — ABNORMAL LOW (ref 13.0–17.0)
MCH: 29.3 pg (ref 26.0–34.0)
MCH: 31.2 pg (ref 26.0–34.0)
MCHC: 33.8 g/dL (ref 30.0–36.0)
MCHC: 36.8 g/dL — ABNORMAL HIGH (ref 30.0–36.0)
MCV: 86.7 fL (ref 80.0–100.0)
MCV: 84.7 fL (ref 80.0–100.0)
Platelets: 77 10*3/uL — ABNORMAL LOW (ref 150–400)
Platelets: 72 10*3/uL — ABNORMAL LOW (ref 150–400)
RBC: 4.27 MIL/uL (ref 4.22–5.81)
RBC: 4.17 MIL/uL — AB (ref 4.22–5.81)
RDW: 14.6 % (ref 11.5–15.5)
RDW: 15.1 % (ref 11.5–15.5)
WBC: 7.3 10*3/uL (ref 4.0–10.5)
WBC: 8.4 10*3/uL (ref 4.0–10.5)
nRBC: 0.4 % — ABNORMAL HIGH (ref 0.0–0.2)
nRBC: 0.4 % — ABNORMAL HIGH (ref 0.0–0.2)

## 2018-05-01 LAB — PREPARE RBC (CROSSMATCH)

## 2018-05-01 LAB — ABO/RH: ABO/RH(D): O POS

## 2018-05-01 LAB — LIPASE, BLOOD: Lipase: 35 U/L (ref 11–51)

## 2018-05-01 LAB — LACTIC ACID, PLASMA: Lactic Acid, Venous: 7.4 mmol/L (ref 0.5–1.9)

## 2018-05-01 MED ORDER — PROPOFOL 1000 MG/100ML IV EMUL: 0.0000 ug/kg/min | INTRAVENOUS | Status: DC

## 2018-05-01 MED ORDER — FENTANYL BOLUS VIA INFUSION: 50.0000 ug | INTRAVENOUS | Status: DC | PRN

## 2018-05-01 MED ORDER — PANTOPRAZOLE SODIUM 40 MG PO TBEC: 40.0000 mg | DELAYED_RELEASE_TABLET | Freq: Every day | ORAL | Status: DC

## 2018-05-01 MED ORDER — SODIUM CHLORIDE 0.9 % IV SOLN: INTRAVENOUS | Status: DC | PRN

## 2018-05-01 MED ORDER — SODIUM CHLORIDE 0.9 % IV SOLN: INTRAVENOUS | Status: DC

## 2018-05-01 MED ORDER — INSULIN ASPART 100 UNIT/ML ~~LOC~~ SOLN
2.0000 [IU] | SUBCUTANEOUS | Status: DC
  Administered 2018-05-01: 4 [IU] via SUBCUTANEOUS
  Administered 2018-05-01 – 2018-05-02 (×4): 2 [IU] via SUBCUTANEOUS
  Administered 2018-05-02 (×3): 4 [IU] via SUBCUTANEOUS
  Administered 2018-05-02 – 2018-05-07 (×19): 2 [IU] via SUBCUTANEOUS
  Administered 2018-05-08 (×2): 4 [IU] via SUBCUTANEOUS
  Administered 2018-05-08: 2 [IU] via SUBCUTANEOUS
  Administered 2018-05-09 (×2): 4 [IU] via SUBCUTANEOUS
  Administered 2018-05-09: 2 [IU] via SUBCUTANEOUS
  Administered 2018-05-09 – 2018-05-10 (×7): 4 [IU] via SUBCUTANEOUS
  Administered 2018-05-11: 2 [IU] via SUBCUTANEOUS
  Administered 2018-05-11: 4 [IU] via SUBCUTANEOUS
  Administered 2018-05-11: 2 [IU] via SUBCUTANEOUS
  Administered 2018-05-11 (×2): 4 [IU] via SUBCUTANEOUS
  Administered 2018-05-11 – 2018-05-12 (×2): 2 [IU] via SUBCUTANEOUS
  Administered 2018-05-12: 4 [IU] via SUBCUTANEOUS
  Administered 2018-05-13 (×2): 2 [IU] via SUBCUTANEOUS
  Administered 2018-05-13: 4 [IU] via SUBCUTANEOUS
  Administered 2018-05-14: 2 [IU] via SUBCUTANEOUS

## 2018-05-01 MED ORDER — PANTOPRAZOLE SODIUM 40 MG IV SOLR
40.0000 mg | Freq: Every day | INTRAVENOUS | Status: DC
  Administered 2018-05-01 – 2018-05-09 (×8): 40 mg via INTRAVENOUS
  Filled 2018-05-01 (×9): qty 40

## 2018-05-01 MED ORDER — ONDANSETRON HCL 4 MG/2ML IJ SOLN
4.0000 mg | Freq: Four times a day (QID) | INTRAMUSCULAR | Status: DC | PRN
  Administered 2018-05-06: 4 mg via INTRAVENOUS

## 2018-05-01 MED ORDER — ONDANSETRON HCL 4 MG/2ML IJ SOLN: 4.0000 mg | Freq: Four times a day (QID) | INTRAMUSCULAR | Status: DC | PRN

## 2018-05-01 MED ORDER — FENTANYL 2500MCG IN NS 250ML (10MCG/ML) PREMIX INFUSION: 25.0000 ug/h | INTRAVENOUS | Status: DC

## 2018-05-01 MED ORDER — SODIUM CHLORIDE 0.9% IV SOLUTION: Freq: Once | INTRAVENOUS | Status: DC

## 2018-05-01 MED ORDER — PROPOFOL 1000 MG/100ML IV EMUL
5.0000 ug/kg/min | INTRAVENOUS | Status: DC
  Filled 2018-05-01: qty 100

## 2018-05-01 MED ORDER — PHENYLEPHRINE HCL-NACL 10-0.9 MG/250ML-% IV SOLN
INTRAVENOUS | Status: AC
  Filled 2018-05-01: qty 250

## 2018-05-01 MED ORDER — MIDAZOLAM HCL 2 MG/2ML IJ SOLN: 2.0000 mg | INTRAMUSCULAR | Status: DC | PRN

## 2018-05-01 MED ORDER — DOCUSATE SODIUM 50 MG/5ML PO LIQD: 100.0000 mg | Freq: Two times a day (BID) | ORAL | Status: DC | PRN

## 2018-05-01 MED ORDER — PHENYLEPHRINE HCL-NACL 10-0.9 MG/250ML-% IV SOLN: 0.0000 ug/min | INTRAVENOUS | Status: DC

## 2018-05-01 MED ORDER — PROPOFOL 1000 MG/100ML IV EMUL
5.0000 ug/kg/min | INTRAVENOUS | Status: DC
  Administered 2018-05-01: 80 ug/kg/min via INTRAVENOUS
  Administered 2018-05-01 (×10): 60 ug/kg/min via INTRAVENOUS
  Administered 2018-05-02: 70 ug/kg/min via INTRAVENOUS
  Administered 2018-05-02: 60 ug/kg/min via INTRAVENOUS
  Administered 2018-05-02 (×2): 80 ug/kg/min via INTRAVENOUS
  Administered 2018-05-02: 70 ug/kg/min via INTRAVENOUS
  Administered 2018-05-02 (×2): 10 ug/kg/min via INTRAVENOUS
  Administered 2018-05-02: 80 ug/kg/min via INTRAVENOUS
  Administered 2018-05-03: 10 ug/kg/min via INTRAVENOUS
  Filled 2018-05-01 (×5): qty 100
  Filled 2018-05-01: qty 300
  Filled 2018-05-01 (×3): qty 100
  Filled 2018-05-01: qty 300
  Filled 2018-05-01: qty 200
  Filled 2018-05-01: qty 100
  Filled 2018-05-01: qty 200
  Filled 2018-05-01 (×2): qty 100

## 2018-05-01 MED ORDER — MIDAZOLAM HCL 2 MG/2ML IJ SOLN
2.0000 mg | INTRAMUSCULAR | Status: DC | PRN
  Administered 2018-05-08 – 2018-05-13 (×4): 2 mg via INTRAVENOUS
  Filled 2018-05-01 (×4): qty 2

## 2018-05-01 MED ORDER — INSULIN REGULAR(HUMAN) IN NACL 100-0.9 UT/100ML-% IV SOLN: INTRAVENOUS | Status: DC

## 2018-05-01 MED ORDER — PHENYLEPHRINE HCL-NACL 10-0.9 MG/250ML-% IV SOLN
0.0000 ug/min | INTRAVENOUS | Status: DC
  Filled 2018-05-01: qty 250

## 2018-05-01 MED ORDER — SODIUM CHLORIDE 0.9 % IV SOLN
INTRAVENOUS | Status: DC
  Administered 2018-05-01 – 2018-05-03 (×4): via INTRAVENOUS

## 2018-05-01 MED ORDER — PIPERACILLIN-TAZOBACTAM 3.375 G IVPB 30 MIN
3.3750 g | Freq: Three times a day (TID) | INTRAVENOUS | Status: DC
  Administered 2018-05-01 – 2018-05-03 (×5): 3.375 g via INTRAVENOUS
  Filled 2018-05-01 (×8): qty 50

## 2018-05-01 MED ORDER — FENTANYL CITRATE (PF) 100 MCG/2ML IJ SOLN: 50.0000 ug | Freq: Once | INTRAMUSCULAR | Status: DC

## 2018-05-01 MED ORDER — PROPOFOL 1000 MG/100ML IV EMUL
INTRAVENOUS | Status: AC
  Administered 2018-05-01: 60 ug/kg/min via INTRAVENOUS
  Filled 2018-05-01: qty 100

## 2018-05-01 MED ORDER — FENTANYL 2500MCG IN NS 250ML (10MCG/ML) PREMIX INFUSION
25.0000 ug/h | INTRAVENOUS | Status: DC
  Administered 2018-05-01: 50 ug/h via INTRAVENOUS
  Administered 2018-05-01: 200 ug/h via INTRAVENOUS
  Administered 2018-05-01 – 2018-05-02 (×4): 400 ug/h via INTRAVENOUS
  Administered 2018-05-03: 200 ug/h via INTRAVENOUS
  Administered 2018-05-03: 350 ug/h via INTRAVENOUS
  Administered 2018-05-04 – 2018-05-05 (×3): 200 ug/h via INTRAVENOUS
  Filled 2018-05-01 (×12): qty 250

## 2018-05-01 MED ORDER — SODIUM CHLORIDE 0.9 % IV BOLUS
1000.0000 mL | Freq: Once | INTRAVENOUS | Status: AC
  Administered 2018-05-01: 1000 mL via INTRAVENOUS

## 2018-05-01 MED ORDER — ONDANSETRON 4 MG PO TBDP: 4.0000 mg | ORAL_TABLET | Freq: Four times a day (QID) | ORAL | Status: DC | PRN

## 2018-05-01 MED ORDER — FENTANYL CITRATE (PF) 100 MCG/2ML IJ SOLN: 50.0000 ug | Freq: Once | INTRAMUSCULAR | Status: AC

## 2018-05-01 MED ORDER — ORAL CARE MOUTH RINSE
15.0000 mL | OROMUCOSAL | Status: DC
  Administered 2018-05-01 – 2018-05-14 (×132): 15 mL via OROMUCOSAL

## 2018-05-01 MED ORDER — 0.9 % SODIUM CHLORIDE (POUR BTL) OPTIME
TOPICAL | Status: DC | PRN
  Administered 2018-05-01: 1000 mL

## 2018-05-01 MED ORDER — SODIUM CHLORIDE 0.9 % IV SOLN
INTRAVENOUS | Status: DC | PRN
  Administered 2018-05-02 – 2018-05-04 (×2): via INTRA_ARTERIAL

## 2018-05-01 MED ORDER — PANTOPRAZOLE SODIUM 40 MG IV SOLR: 40.0000 mg | Freq: Every day | INTRAVENOUS | Status: DC

## 2018-05-01 MED ORDER — CHLORHEXIDINE GLUCONATE 0.12% ORAL RINSE (MEDLINE KIT)
15.0000 mL | Freq: Two times a day (BID) | OROMUCOSAL | Status: DC
  Administered 2018-05-01 – 2018-05-14 (×27): 15 mL via OROMUCOSAL

## 2018-05-01 MED ORDER — FENTANYL BOLUS VIA INFUSION
50.0000 ug | INTRAVENOUS | Status: DC | PRN
  Administered 2018-05-08: 25 ug via INTRAVENOUS
  Filled 2018-05-01: qty 50
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
## 2018-05-01 NOTE — Transfer of Care (Signed)
Immediate Anesthesia Transfer of Care Note  Patient: Bruce Henson  Procedure(s) Performed: IR WITH ANESTHESIA (N/A )  Patient Location: ICU  Anesthesia Type:General  Level of Consciousness: unresponsive and Patient remains intubated per anesthesia plan  Airway & Oxygen Therapy: Patient remains intubated per anesthesia plan and Patient placed on Ventilator (see vital sign flow sheet for setting)  Post-op Assessment: Report given to RN and Post -op Vital signs reviewed and stable  Post vital signs: Reviewed and stable  Last Vitals:  Vitals Value Taken Time  BP 90/68 05/02/2018 12:46 AM  Temp    Pulse 96 04/06/2018 12:49 AM  Resp 26 04/17/2018 12:49 AM  SpO2 98 % 04/09/2018 12:49 AM  Vitals shown include unvalidated device data.  Last Pain:  Vitals:   04/19/2018 1911  TempSrc: Tympanic         Complications: No apparent anesthesia complications

## 2018-05-01 NOTE — Progress Notes (Signed)
Trauma MD paged with results of X-rays and out of abdominal wound vac in 4 hours. No new orders. Order to adjust RASS goal to keep pt sedated.

## 2018-05-01 NOTE — Anesthesia Postprocedure Evaluation (Signed)
Anesthesia Post Note  Patient: Bruce Henson  Procedure(s) Performed: IR WITH ANESTHESIA (N/A )     Patient location during evaluation: SICU Anesthesia Type: General Level of consciousness: sedated Pain management: pain level controlled Vital Signs Assessment: post-procedure vital signs reviewed and stable Respiratory status: patient remains intubated per anesthesia plan Cardiovascular status: stable Postop Assessment: no apparent nausea or vomiting Anesthetic complications: no    Last Vitals:  Vitals:   04/29/2018 0100 04/17/2018 0115  BP: 94/63 112/60  Pulse: 93 92  Resp: (!) 26 (!) 23  Temp:    SpO2: 98% 100%    Last Pain:  Vitals:   05/03/2018 1911  TempSrc: Tympanic                 Shelton SilvasKevin D Aujanae Mccullum

## 2018-05-01 NOTE — Progress Notes (Signed)
Follow up - Trauma and Critical Care  Patient Details:    Bruce Henson is an 52 y.o. male.  Lines/tubes : Airway 7.5 mm (Active)  Secured at (cm) 25 cm 04/28/2018  7:21 AM  Measured From Lips 04/17/2018  7:21 AM  Secured Location Center 04/25/2018  7:21 AM  Secured By [employer] 04/19/2018  7:21 AM  Tube Holder Repositioned Yes 04/03/2018  7:21 AM  Cuff Pressure (cm H2O) 28 cm H2O 04/10/2018  7:21 AM  Site Condition Dry;Edema 04/16/2018  7:21 AM     CVC Triple Lumen 04/28/2018 Left Internal jugular (Active)  Indication for Insertion or Continuance of Line Vasoactive infusions 04/16/2018  2:00 AM  Site Assessment Clean;Intact;Dry 04/04/2018  2:00 AM  Proximal Lumen Status Infusing;Flushed 04/08/2018  2:00 AM  Medial Lumen Status Flushed;Saline locked 04/12/2018  2:00 AM  Distal Lumen Status Flushed;Saline locked 04/14/2018  2:00 AM  Dressing Type Transparent;Occlusive 04/16/2018  2:00 AM  Dressing Status Dry;Intact 04/26/2018  2:00 AM  Dressing Change Due 05/07/18 04/07/2018  2:00 AM     Arterial Line 04/13/2018 Radial (Active)  Site Assessment Clean;Dry;Intact 04/04/2018  2:00 AM  Line Status Pulsatile blood flow 04/07/2018  2:00 AM  Art Line Waveform Appropriate 04/22/2018  2:00 AM  Art Line Interventions Zeroed and calibrated;Leveled 04/05/2018  2:00 AM  Color/Movement/Sensation Capillary refill less than 3 sec 04/27/2018  2:00 AM  Dressing Status Clean;Dry;Intact 04/11/2018  2:00 AM  Dressing Change Due 05/07/18 04/14/2018  2:00 AM     Negative Pressure Wound Therapy Abdomen (Active)  Site / Wound Assessment Dry;Clean 04/13/2018  1:00 AM  Peri-wound Assessment Intact 04/08/2018  1:00 AM  Target Pressure (mmHg) 125 04/14/2018  1:00 AM  Canister Changed Yes 04/17/2018  1:00 AM  Dressing Status Intact 04/09/2018  1:00 AM  Drainage Amount Copious 04/08/2018  1:00 AM  Drainage Description Sanguineous 04/24/2018  1:00 AM  Output (mL) 500 mL 04/27/2018  8:00 AM      NG/OG Tube Orogastric 18 Fr. Center mouth Confirmed by Surgical Manipulation Documented cm marking at nare/ corner of mouth (Active)  Site Assessment Dry;Clean;Intact 04/07/2018  1:00 AM  Ongoing Placement Verification No change in respiratory status;No acute changes, not attributed to clinical condition;Xray 04/07/2018  1:00 AM  Status Suction-low intermittent 04/17/2018  1:00 AM     Urethral Catheter Urology MD 14 Fr. (Active)  Indication for Insertion or Continuance of Catheter Other (comment) 04/18/2018  5:00 AM  Site Assessment Clean;Intact;Dry 04/12/2018  5:00 AM  Catheter Maintenance Bag below level of bladder;Catheter secured;Drainage bag/tubing not touching floor;Insertion date on drainage bag;No dependent loops;Seal intact 04/25/2018  5:00 AM  Collection Container Standard drainage bag 04/07/2018  5:00 AM  Securement Method Leg strap 04/07/2018  5:00 AM  Output (mL) 125 mL 04/29/2018  8:00 AM    Microbiology/Sepsis markers: Results for orders placed or performed during the hospital encounter of 05/03/2018  MRSA PCR Screening     Status: None   Collection Time: 04/12/2018  5:03 AM  Result Value Ref Range Status   MRSA by PCR NEGATIVE NEGATIVE Final    Comment:        The GeneXpert MRSA Assay (FDA approved for NASAL specimens only), is one component of a comprehensive MRSA colonization surveillance program. It is not intended to diagnose MRSA infection nor to guide or monitor treatment for MRSA infections. Performed at [facility], [address] [place], [place] [address]     Anti-infectives:  Anti-infectives (From admission,  onward)   Start     Dose/Rate Route Frequency Ordered Stop   04/11/2018 1930  ceFAZolin (ANCEF) IVPB 2g/100 mL premix     2 g 200 mL/hr over 30 Minutes Intravenous  Once 04/09/2018 1919        Best Practice/Protocols:  VTE Prophylaxis: Mechanical Continous Sedation  Consults: Treatment Team:  Terance Hart, MD Marcine Matar, MD Karl Ito, MD Haddix, Gillie Manners, MD    Events:  Subjective:    Overnight Issues: Patient stable overnight.  Good urine output.  Peak airway pressures around 30.  Oxygenation is good.  Being followed by the ICU team.  Objective:  Vital signs for last 24 hours: Temp:  [95.7 F (35.4 C)-100.9 F (38.3 C)] 100.9 F (38.3 C) (12/29 0800) Pulse Rate:  [88-136] 110 (12/29 0800) Resp:  [0-26] 21 (12/29 0800) BP: (68-174)/(38-158) 122/80 (12/29 0800) SpO2:  [95 %-100 %] 100 % (12/29 0800) Arterial Line BP: (90-120)/(49-71) 118/70 (12/29 0800) FiO2 (%):  [50 %-100 %] 50 % (12/29 0800) Weight:  [145.9 kg] 145.9 kg (12/29 0145)  Hemodynamic parameters for last 24 hours: CVP:  [12 mmHg-16 mmHg] 12 mmHg  Intake/Output from previous day: 12/28 0701 - 12/29 0700 In: 14919.3 [I.V.:7170.3; Blood:7749] Out: 5800 [Urine:800; Drains:999; Stool:1; Blood:4000]  Intake/Output this shift: Total I/O In: 135.2 [I.V.:135.2] Out: 625 [Urine:125; Drains:500]  Vent settings for last 24 hours: Vent Mode: PCV FiO2 (%):  [50 %-100 %] 50 % Set Rate:  [16 bmp-26 bmp] 26 bmp Vt Set:  [098 mL] 620 mL PEEP:  [5 cmH20-10 cmH20] 8 cmH20 Plateau Pressure:  [17 cmH20-31 cmH20] 27 cmH20  Physical Exam:  General: On ventilator heavily sedated Neuro: RASS -1 HEENT/Neck: no JVD GI: Vacuum VAC dressing in place.  No evidence of leak. Extremities: Arterial sheath in place left upper extremity.  Both upper extremities viable.  Both lower extremities viable.  Warm well perfused.  Right femur with brace in place  Results for orders placed or performed during the hospital encounter of 04/23/2018 (from the past 24 hour(s))  Type and screen Ordered by PROVIDER DEFAULT     Status: None (Preliminary result)   Collection Time: 04/18/2018  6:34 PM  Result Value Ref Range   ABO/RH(D) O POS    Antibody Screen NEG    Sample Expiration      05/03/2018 Performed at [facility], [address]  [place], [place] [address]    Unit Number [id number]    Blood Component Type RBC LR PHER1    Unit division 00    Status of Unit ISSUED    Unit tag comment VERBAL ORDERS PER DR YOW    Transfusion Status OK TO TRANSFUSE    Crossmatch Result COMPATIBLE    Unit Number [id number]    Blood Component Type RBC LR PHER1    Unit division 00    Status of Unit ISSUED    Unit tag comment VERBAL ORDERS PER DR YOW    Transfusion Status OK TO TRANSFUSE    Crossmatch Result COMPATIBLE    Unit Number [id number]    Blood Component Type RBC LR PHER2    Unit division 00    Status of Unit ISSUED    Unit tag comment VERBAL ORDERS PER DR YOW    Transfusion Status OK TO TRANSFUSE    Crossmatch Result COMPATIBLE    Unit Number [id number]    Blood Component Type RED CELLS,LR    Unit division 00  Status of Unit ISSUED    Unit tag comment VERBAL ORDERS PER DR YOW    Transfusion Status OK TO TRANSFUSE    Crossmatch Result COMPATIBLE    Unit Number [id number]    Blood Component Type RED CELLS,LR    Unit division 00    Status of Unit ISSUED    Unit tag comment VERBAL ORDERS PER DR YOW    Transfusion Status OK TO TRANSFUSE    Crossmatch Result COMPATIBLE    Unit Number [id number]    Blood Component Type RED CELLS,LR    Unit division 00    Status of Unit ISSUED    Unit tag comment VERBAL ORDERS PER DR YOW    Transfusion Status OK TO TRANSFUSE    Crossmatch Result COMPATIBLE    Unit Number [id number]    Blood Component Type RBC LR PHER1    Unit division 00    Status of Unit ISSUED    Transfusion Status OK TO TRANSFUSE    Crossmatch Result COMPATIBLE    Unit tag comment VERBAL ORDERS PER DR YOW    Unit Number [id number]    Blood Component Type RED CELLS,LR    Unit division 00    Status of Unit ISSUED    Transfusion Status OK TO TRANSFUSE    Crossmatch Result COMPATIBLE    Unit tag comment VERBAL ORDERS PER DR YOW    Unit Number [id number]    Blood Component Type RBC LR  PHER1    Unit division 00    Status of Unit ISSUED    Transfusion Status OK TO TRANSFUSE    Crossmatch Result COMPATIBLE    Unit tag comment VERBAL ORDERS PER DR YOW    Unit Number [id number]    Blood Component Type RBC LR PHER1    Unit division 00    Status of Unit ISSUED    Transfusion Status OK TO TRANSFUSE    Crossmatch Result COMPATIBLE    Unit tag comment VERBAL ORDERS PER DR YOW    Unit Number [id number]    Blood Component Type RED CELLS,LR    Unit division 00    Status of Unit ISSUED    Transfusion Status OK TO TRANSFUSE    Crossmatch Result COMPATIBLE    Unit Number [id number]    Blood Component Type RBC LR PHER1    Unit division 00    Status of Unit ISSUED    Transfusion Status OK TO TRANSFUSE    Crossmatch Result COMPATIBLE    Unit Number [id number]    Blood Component Type RBC LR PHER2    Unit division 00    Status of Unit ISSUED    Transfusion Status OK TO TRANSFUSE    Crossmatch Result Compatible    Unit Number [id number]    Blood Component Type RBC LR PHER1    Unit division 00    Status of Unit ISSUED    Transfusion Status OK TO TRANSFUSE    Crossmatch Result Compatible    Unit Number [id number]    Blood Component Type RED CELLS,LR    Unit division 00    Status of Unit ISSUED    Transfusion Status OK TO TRANSFUSE    Crossmatch Result Compatible    Unit Number [id number]    Blood Component Type RED CELLS,LR    Unit division 00    Status of Unit ISSUED    Transfusion Status OK TO TRANSFUSE    Crossmatch Result Compatible    Unit Number [id number]  Blood Component Type RED CELLS,LR    Unit division 00    Status of Unit ISSUED    Transfusion Status OK TO TRANSFUSE    Crossmatch Result Compatible    Unit Number [id number]    Blood Component Type RBC LR PHER2    Unit division 00    Status of Unit ISSUED    Transfusion Status OK TO TRANSFUSE    Crossmatch Result Compatible    Unit Number [id number]    Blood Component  Type RBC LR PHER2    Unit division 00    Status of Unit ISSUED    Transfusion Status OK TO TRANSFUSE    Crossmatch Result Compatible    Unit Number [id number]    Blood Component Type RED CELLS,LR    Unit division 00    Status of Unit ISSUED    Transfusion Status OK TO TRANSFUSE    Crossmatch Result Compatible    Unit Number [id number]    Blood Component Type RED CELLS,LR    Unit division 00    Status of Unit ISSUED    Transfusion Status OK TO TRANSFUSE    Crossmatch Result Compatible    Unit Number [id number]    Blood Component Type RED CELLS,LR    Unit division 00    Status of Unit ISSUED    Transfusion Status OK TO TRANSFUSE    Crossmatch Result Compatible    Unit Number [id number]    Blood Component Type RED CELLS,LR    Unit division 00    Status of Unit ISSUED    Transfusion Status OK TO TRANSFUSE    Crossmatch Result Compatible    Unit Number [id number]    Blood Component Type RED CELLS,LR    Unit division 00    Status of Unit ISSUED    Transfusion Status OK TO TRANSFUSE    Crossmatch Result Compatible    Unit Number [id number]    Blood Component Type RED CELLS,LR    Unit division 00    Status of Unit ISSUED    Transfusion Status OK TO TRANSFUSE    Crossmatch Result Compatible    Unit Number [id number]    Blood Component Type RED CELLS,LR    Unit division 00    Status of Unit ISSUED    Transfusion Status OK TO TRANSFUSE    Crossmatch Result Compatible    Unit Number [id number]    Blood Component Type RED CELLS,LR    Unit division 00    Status of Unit ISSUED    Transfusion Status OK TO TRANSFUSE    Crossmatch Result Compatible    Unit Number [id number]    Blood Component Type RED CELLS,LR    Unit division 00    Status of Unit ISSUED    Transfusion Status OK TO TRANSFUSE    Crossmatch Result Compatible    Unit Number [id number]    Blood Component Type RED CELLS,LR    Unit division 00    Status of Unit REL FROM [facility]     Transfusion Status OK TO TRANSFUSE    Crossmatch Result Compatible    Unit Number [id number]    Blood Component Type RED CELLS,LR    Unit division 00    Status of Unit REL FROM [facility]    Transfusion Status OK TO TRANSFUSE    Crossmatch Result Compatible    Unit Number [id number]    Blood Component Type RED CELLS,LR    Unit division 00    Status of Unit REL  FROM [facility]    Transfusion Status OK TO TRANSFUSE    Crossmatch Result Compatible    Unit Number [id number]    Blood Component Type RBC LR PHER1    Unit division 00    Status of Unit REL FROM [facility]    Transfusion Status OK TO TRANSFUSE    Crossmatch Result Compatible    Unit Number [id number]    Blood Component Type RED CELLS,LR    Unit division 00    Status of Unit REL FROM [facility]    Transfusion Status OK TO TRANSFUSE    Crossmatch Result Compatible    Unit Number [id number]    Blood Component Type RED CELLS,LR    Unit division 00    Status of Unit REL FROM [facility]    Transfusion Status OK TO TRANSFUSE    Crossmatch Result Compatible    Unit Number [id number]    Blood Component Type RED CELLS,LR    Unit division 00    Status of Unit REL FROM [facility]    Transfusion Status OK TO TRANSFUSE    Crossmatch Result Compatible    Unit Number [id number]    Blood Component Type RED CELLS,LR    Unit division 00    Status of Unit REL FROM [facility]    Transfusion Status OK TO TRANSFUSE    Crossmatch Result Compatible    Unit Number [id number]    Blood Component Type RBC LR PHER2    Unit division 00    Status of Unit ALLOCATED    Transfusion Status OK TO TRANSFUSE    Crossmatch Result Compatible    Unit Number [id number]    Blood Component Type RED CELLS,LR    Unit division 00    Status of Unit ALLOCATED    Transfusion Status OK TO TRANSFUSE    Crossmatch Result Compatible    Unit Number [id number]    Blood Component Type RBC LR PHER1    Unit division 00    Status of Unit ALLOCATED    Transfusion  Status OK TO TRANSFUSE    Crossmatch Result Compatible    Unit Number [id number]    Blood Component Type RBC LR PHER1    Unit division 00    Status of Unit ALLOCATED    Transfusion Status OK TO TRANSFUSE    Crossmatch Result Compatible   Prepare fresh frozen plasma     Status: None (Preliminary result)   Collection Time: 04/18/2018  6:34 PM  Result Value Ref Range   Unit Number [id number]    Blood Component Type LIQ PLASMA    Unit division 00    Status of Unit ISSUED    Unit tag comment EMERGENCY RELEASE    Transfusion Status OK TO TRANSFUSE    Unit Number [id number]    Blood Component Type LIQ PLASMA    Unit division 00    Status of Unit ISSUED    Unit tag comment EMERGENCY RELEASE    Transfusion Status OK TO TRANSFUSE    Unit Number [id number]    Blood Component Type LIQ PLASMA    Unit division 00    Status of Unit ISSUED    Unit tag comment EMERGENCY RELEASE    Transfusion Status OK TO TRANSFUSE    Unit Number [id number]    Blood Component Type LIQ PLASMA    Unit division 00    Status of Unit ISSUED    Unit tag comment EMERGENCY RELEASE    Transfusion Status OK TO TRANSFUSE  Unit Number [id number]    Blood Component Type LIQ PLASMA    Unit division 00    Status of Unit ISSUED    Transfusion Status OK TO TRANSFUSE    Unit Number [id number]    Blood Component Type LIQ PLASMA    Unit division 00    Status of Unit ISSUED    Transfusion Status OK TO TRANSFUSE    Unit Number [id number]    Blood Component Type LIQ PLASMA    Unit division 00    Status of Unit ISSUED    Unit tag comment VERBAL ORDERS PER DR YAO    Transfusion Status OK TO TRANSFUSE    Unit Number [id number]    Blood Component Type THAWED PLASMA    Unit division 00    Status of Unit ISSUED    Unit tag comment VERBAL ORDERS PER DR YAO    Transfusion Status OK TO TRANSFUSE    Unit Number [id number]    Blood Component Type THW PLS APHR    Unit division 00    Status of  Unit ISSUED    Transfusion Status OK TO TRANSFUSE    Unit Number [id number]    Blood Component Type THW PLS APHR    Unit division 00    Status of Unit ISSUED    Transfusion Status OK TO TRANSFUSE    Unit Number [id number]    Blood Component Type THW PLS APHR    Unit division A0    Status of Unit ISSUED    Transfusion Status OK TO TRANSFUSE    Unit Number [id number]    Blood Component Type THAWED PLASMA    Unit division 00    Status of Unit ISSUED    Transfusion Status OK TO TRANSFUSE    Unit Number [id number]    Blood Component Type THAWED PLASMA    Unit division 00    Status of Unit ISSUED    Transfusion Status OK TO TRANSFUSE    Unit Number [id number]    Blood Component Type THAWED PLASMA    Unit division 00    Status of Unit ISSUED    Transfusion Status OK TO TRANSFUSE    Unit Number [id number]    Blood Component Type THAWED PLASMA    Unit division 00    Status of Unit ISSUED    Transfusion Status OK TO TRANSFUSE    Unit Number [id number]    Blood Component Type THAWED PLASMA    Unit division 00    Status of Unit ISSUED    Transfusion Status OK TO TRANSFUSE    Unit Number [id number]    Blood Component Type THW PLS APHR    Unit division B0    Status of Unit ISSUED    Transfusion Status OK TO TRANSFUSE    Unit Number [id number]    Blood Component Type THAWED PLASMA    Unit division 00    Status of Unit ISSUED    Transfusion Status OK TO TRANSFUSE    Unit Number [id number]    Blood Component Type THAWED PLASMA    Unit division 00    Status of Unit ISSUED    Transfusion Status OK TO TRANSFUSE    Unit Number [id number]    Blood Component Type THAWED PLASMA    Unit division 00    Status of Unit ALLOCATED    Transfusion Status OK TO TRANSFUSE    Unit Number [id number]    Blood Component Type THAWED PLASMA  Unit division 00    Status of Unit ALLOCATED    Transfusion Status OK TO TRANSFUSE    Unit Number [id number]     Blood Component Type THAWED PLASMA    Unit division 00    Status of Unit ISSUED    Transfusion Status OK TO TRANSFUSE    Unit Number [id number]    Blood Component Type THAWED PLASMA    Unit division 00    Status of Unit REL FROM [facility]    Transfusion Status OK TO TRANSFUSE    Unit Number [id number]    Blood Component Type THAWED PLASMA    Unit division 00    Status of Unit REL FROM [facility]    Transfusion Status OK TO TRANSFUSE    Unit Number [id number]    Blood Component Type THAWED PLASMA    Unit division 00    Status of Unit REL FROM [facility]    Transfusion Status OK TO TRANSFUSE    Unit Number [id number]    Blood Component Type THW PLS APHR    Unit division A0    Status of Unit REL FROM [facility]    Transfusion Status OK TO TRANSFUSE    Unit Number [id number]    Blood Component Type THAWED PLASMA    Unit division 00    Status of Unit ALLOCATED    Transfusion Status OK TO TRANSFUSE    Unit Number [id number]    Blood Component Type THAWED PLASMA    Unit division 00    Status of Unit ALLOCATED    Transfusion Status OK TO TRANSFUSE    Unit Number [id number]    Blood Component Type THAWED PLASMA    Unit division 00    Status of Unit REL FROM [facility]    Transfusion Status OK TO TRANSFUSE    Unit Number [id number]    Blood Component Type THAWED PLASMA    Unit division 00    Status of Unit REL FROM [facility]    Transfusion Status OK TO TRANSFUSE    Unit Number [id number]    Blood Component Type THAWED PLASMA    Unit division 00    Status of Unit REL FROM [facility]    Transfusion Status OK TO TRANSFUSE    Unit Number [id number]    Blood Component Type THAWED PLASMA    Unit division 00    Status of Unit REL FROM [facility]    Transfusion Status OK TO TRANSFUSE   CDS serology     Status: None   Collection Time: 04/11/2018  6:49 PM  Result Value Ref Range   CDS serology specimen      SPECIMEN WILL BE HELD FOR 14 DAYS IF TESTING IS REQUIRED  CBC     Status:  Abnormal   Collection Time: 04/28/2018  6:49 PM  Result Value Ref Range   WBC 6.9 4.0 - 10.5 K/uL   RBC 4.21 (L) 4.22 - 5.81 MIL/uL   Hemoglobin 12.0 (L) 13.0 - 17.0 g/dL   HCT 62.7 03.5 - 00.9 %   MCV 97.6 80.0 - 100.0 fL   MCH 28.5 26.0 - 34.0 pg   MCHC 29.2 (L) 30.0 - 36.0 g/dL   RDW 38.1 82.9 - 93.7 %   Platelets 198 150 - 400 K/uL   nRBC 0.3 (H) 0.0 - 0.2 %  Ethanol     Status: Abnormal   Collection Time: 04/17/2018  6:49 PM  Result Value Ref Range   Alcohol, Ethyl (B) 48 (H) <10  mg/dL  Comprehensive metabolic panel     Status: Abnormal   Collection Time: 04/14/2018  6:49 PM  Result Value Ref Range   Sodium 144 135 - 145 mmol/L   Potassium 3.4 (L) 3.5 - 5.1 mmol/L   Chloride 105 98 - 111 mmol/L   CO2 14 (L) 22 - 32 mmol/L   Glucose, Bld 314 (H) 70 - 99 mg/dL   BUN 13 6 - 20 mg/dL   Creatinine, Ser 1.61 (H) 0.61 - 1.24 mg/dL   Calcium 8.8 (L) 8.9 - 10.3 mg/dL   Total Protein 6.4 (L) 6.5 - 8.1 g/dL   Albumin 3.5 3.5 - 5.0 g/dL   AST 096 (H) 15 - 41 U/L   ALT 74 (H) 0 - 44 U/L   Alkaline Phosphatase 56 38 - 126 U/L   Total Bilirubin 1.4 (H) 0.3 - 1.2 mg/dL   GFR calc non Af Amer 58 (L) >60 mL/min   GFR calc Af Amer >60 >60 mL/min   Anion gap 25 (H) 5 - 15  I-Stat Chem 8, ED     Status: Abnormal   Collection Time: 04/26/2018  7:02 PM  Result Value Ref Range   Sodium 143 135 - 145 mmol/L   Potassium 3.0 (L) 3.5 - 5.1 mmol/L   Chloride 105 98 - 111 mmol/L   BUN 15 6 - 20 mg/dL   Creatinine, Ser 0.45 (H) 0.61 - 1.24 mg/dL   Glucose, Bld 409 (H) 70 - 99 mg/dL   Calcium, Ion 8.11 (L) 1.15 - 1.40 mmol/L   TCO2 17 (L) 22 - 32 mmol/L   Hemoglobin 13.3 13.0 - 17.0 g/dL   HCT 91.4 78.2 - 95.6 %  I-Stat CG4 Lactic Acid, ED     Status: Abnormal   Collection Time: 04/06/2018  7:03 PM  Result Value Ref Range   Lactic Acid, Venous 15.26 (HH) 0.5 - 1.9 mmol/L   Comment NOTIFIED PHYSICIAN   DIC (disseminated intravasc coag) panel (STAT)     Status: Abnormal   Collection Time: 04/24/2018   7:08 PM  Result Value Ref Range   Prothrombin Time 15.6 (H) 11.4 - 15.2 seconds   INR 1.26    aPTT 36 24 - 36 seconds   Fibrinogen 190 (L) 210 - 475 mg/dL   D-Dimer, Quant >21.30 (H) 0.00 - 0.50 ug/mL-FEU   Platelets 197 150 - 400 K/uL   Smear Review NO SCHISTOCYTES SEEN   ABO/Rh     Status: None   Collection Time: 04/15/2018  7:10 PM  Result Value Ref Range   ABO/RH(D)      O POS Performed at [facility], [address], [place], [place] [address]   Prepare platelet pheresis     Status: None (Preliminary result)   Collection Time: 04/24/2018  7:35 PM  Result Value Ref Range   Unit Number [id number]    Blood Component Type PLTPHER LR1    Unit division 00    Status of Unit ISSUED    Unit tag comment VERBAL ORDERS PER DR YAO    Transfusion Status OK TO TRANSFUSE    Unit Number [id number]    Blood Component Type PLTP LR1 PAS    Unit division 00    Status of Unit ISSUED    Unit tag comment VERBAL ORDERS PER DR YAO    Transfusion Status OK TO TRANSFUSE    Unit Number [id number]    Blood Component Type PLTPHER LR1    Unit division 00  Status of Unit ISSUED    Transfusion Status      OK TO TRANSFUSE Performed [facility], [address] [place], [place] [address]   Prepare cryoprecipitate     Status: None (Preliminary result)   Collection Time: 04/04/2018  7:56 PM  Result Value Ref Range   Unit Number [id number]    Blood Component Type CRYPOOL THAW    Unit division 00    Status of Unit ISSUED    Transfusion Status OK TO TRANSFUSE    Unit Number [id number]    Blood Component Type CRYPOOL THAW    Unit division 00    Status of Unit ISSUED    Transfusion Status OK TO TRANSFUSE   DIC panel     Status: Abnormal   Collection Time: 04/19/2018 10:43 PM  Result Value Ref Range   Prothrombin Time 19.1 (H) 11.4 - 15.2 seconds   INR 1.62    aPTT 46 (H) 24 - 36 seconds   Fibrinogen 165 (L) 210 - 475 mg/dL   D-Dimer, Quant >28.41>20.00 (H) 0.00 - 0.50  ug/mL-FEU   Platelets 40 (L) 150 - 400 K/uL   Smear Review NO SCHISTOCYTES SEEN   Glucose, capillary     Status: Abnormal   Collection Time: 04/15/2018  1:03 AM  Result Value Ref Range   Glucose-Capillary 138 (H) 70 - 99 mg/dL  I-STAT 3, arterial blood gas (G3+)     Status: Abnormal   Collection Time: 04/23/2018  1:28 AM  Result Value Ref Range   pH, Arterial 7.353 7.350 - 7.450   pCO2 arterial 28.9 (L) 32.0 - 48.0 mmHg   pO2, Arterial 265.0 (H) 83.0 - 108.0 mmHg   Bicarbonate 16.0 (L) 20.0 - 28.0 mmol/L   TCO2 17 (L) 22 - 32 mmol/L   O2 Saturation 100.0 %   Acid-base deficit 8.0 (H) 0.0 - 2.0 mmol/L   Patient temperature 37.0 C    Sample type ARTERIAL   Lactic acid, plasma     Status: Abnormal   Collection Time: 04/08/2018  1:57 AM  Result Value Ref Range   Lactic Acid, Venous 7.4 (HH) 0.5 - 1.9 mmol/L  DIC (disseminated intravasc coag) panel     Status: Abnormal   Collection Time: 04/16/2018  1:57 AM  Result Value Ref Range   Prothrombin Time 16.5 (H) 11.4 - 15.2 seconds   INR 1.35    aPTT 34 24 - 36 seconds   Fibrinogen 227 210 - 475 mg/dL   D-Dimer, Quant >32.44>20.00 (H) 0.00 - 0.50 ug/mL-FEU   Platelets 76 (L) 150 - 400 K/uL   Smear Review NO SCHISTOCYTES SEEN   Triglycerides     Status: Abnormal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

## 2018-05-01 NOTE — Progress Notes (Signed)
Assessed IJ CVC under Aspen collar, noted to be open to air with no dressing or biopatch. Cleansed with CHG, biopatch and central line dressing applied.
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
## 2018-05-01 NOTE — Progress Notes (Signed)
eLink Physician-Brief Progress Note Patient Name: Bruce Henson DOB: [birth year] MRN: [id number]   Date of Service  05/02/2018  HPI/Events of Note  Nursing request for orders for Phenylephrine and Propofol IV infusion. Please note that PCCM consulted for ventilator management only.   eICU Interventions  Will order: 1. Propofol IV infusion. Titrate to RASS = 0 to -1. 2. Phenylephrine IV infusion. Titrate to MAP > = 65     Intervention Category Major Interventions: Hypotension - evaluation and management;Respiratory failure - evaluation and management  Sommer,Steven Eugene 04/07/2018, 1:21 AM
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
## 2018-05-01 NOTE — Anesthesia Procedure Notes (Signed)
Arterial Line Insertion Start/End12/09/2017 8:44 PM, 04/30/2018 8:46 PM Performed by: Shelton SilvasHollis, Gwenevere Goga D, MD, anesthesiologist  Patient location: Pre-op. Preanesthetic checklist: patient identified, IV checked, site marked, risks and benefits discussed, surgical consent, monitors and equipment checked, pre-op evaluation, timeout performed and anesthesia consent Lidocaine 1% used for infiltration Right, radial was placed Catheter size: 20 Fr Hand hygiene performed  and maximum sterile barriers used   Attempts: 1 Procedure performed without using ultrasound guided technique. Following insertion, dressing applied. Post procedure assessment: normal and unchanged  Patient tolerated the procedure well with no immediate complications.

## 2018-05-01 NOTE — Addendum Note (Signed)
Addendum  created 04/03/2018 1052 by Edmonia CaprioAuston, Miu Chiong M, CRNA   Charge Capture section accepted

## 2018-05-01 NOTE — Anesthesia Preprocedure Evaluation (Signed)
Anesthesia Evaluation  Patient identified by MRN, date of birth, ID band Patient unresponsive    Reviewed: Allergy & Precautions, Patient's Chart, lab work & pertinent test results, Unable to perform ROS - Chart review onlyPreop documentation limited or incomplete due to emergent nature of procedure.  Airway Mallampati: Intubated       Dental   Pulmonary     + decreased breath sounds      Cardiovascular hypertension, Pt. on medications and Pt. on home beta blockers  Rhythm:Regular Rate:Tachycardia     Neuro/Psych    GI/Hepatic GERD  ,  Endo/Other  diabetes, Type 2, Oral Hypoglycemic Agents  Renal/GU      Musculoskeletal negative musculoskeletal ROS (+)   Abdominal (+) + obese,   Peds  Hematology negative hematology ROS (+)   Anesthesia Other Findings   Reproductive/Obstetrics                             Anesthesia Physical Anesthesia Plan  ASA: IV and emergent  Anesthesia Plan: General   Post-op Pain Management:    Induction: Inhalational  PONV Risk Score and Plan: Treatment may vary due to age or medical condition  Airway Management Planned: Oral ETT  Additional Equipment: Arterial line and CVP  Intra-op Plan:   Post-operative Plan: Post-operative intubation/ventilation  Informed Consent: I have reviewed the patients History and Physical, chart, labs and discussed the procedure including the risks, benefits and alternatives for the proposed anesthesia with the patient or authorized representative who has indicated his/her understanding and acceptance.   History available from chart only and Only emergency history available  Plan Discussed with: CRNA  Anesthesia Plan Comments:         Anesthesia Quick Evaluation

## 2018-05-01 NOTE — Progress Notes (Signed)
Transferred patient from the O.R to room 4N17 while patient was on the vent. Kept patient on the same ventilator settings from the operating room.

## 2018-05-01 NOTE — Progress Notes (Signed)
Trauma MD paged regarding minimal urine output and tachycardia. Orders received for 1L NS bolus and 2units pRBCs.

## 2018-05-01 NOTE — Procedures (Signed)
Central Venous Catheter Insertion Procedure Note Bruce Henson [id number] [birth year]  Procedure: Insertion of Central Venous Catheter Indications: hypotension, needs access  Procedure Details Consent: Unable to obtain consent because of emergent medical necessity.  Maximum sterile technique was used including antiseptics and gloves. Skin prep: Chlorhexidine; local anesthetic administered A antimicrobial bonded/coated triple lumen catheter was placed in the right internal jugular vein using the Seldinger technique with us guidance. The wire was confirmed to be in correct position with us.   Evaluation Blood flow good Complications: No apparent complications Patient did tolerate procedure well. Chest X-ray ordered to verify placement.  CXR: pending.  Emelia LoronMatthew Emer Onnen 04/25/2018, 12:10 AM

## 2018-05-02 ENCOUNTER — Encounter (HOSPITAL_COMMUNITY): Admission: EM | Disposition: E | Payer: Self-pay | Source: Home / Self Care

## 2018-05-02 ENCOUNTER — Inpatient Hospital Stay (HOSPITAL_COMMUNITY): Payer: 59

## 2018-05-02 ENCOUNTER — Inpatient Hospital Stay (HOSPITAL_COMMUNITY): Payer: 59 | Admitting: Certified Registered"

## 2018-05-02 ENCOUNTER — Encounter (HOSPITAL_COMMUNITY): Payer: Self-pay | Admitting: Diagnostic Radiology

## 2018-05-02 ENCOUNTER — Encounter (HOSPITAL_COMMUNITY): Payer: Self-pay | Admitting: General Surgery

## 2018-05-02 HISTORY — PX: SECONDARY CLOSURE OF WOUND: SHX6208

## 2018-05-02 HISTORY — PX: EXTERNAL FIXATION LEG: SHX1549

## 2018-05-02 HISTORY — PX: VACUUM ASSISTED CLOSURE CHANGE: SHX5227

## 2018-05-02 HISTORY — PX: EXTERNAL FIXATION PELVIS: SHX1551

## 2018-05-02 LAB — POCT I-STAT 3, ART BLOOD GAS (G3+)
Acid-base deficit: 10 mmol/L — ABNORMAL HIGH (ref 0.0–2.0)
Acid-base deficit: 11 mmol/L — ABNORMAL HIGH (ref 0.0–2.0)
Acid-base deficit: 7 mmol/L — ABNORMAL HIGH (ref 0.0–2.0)
Acid-base deficit: 8 mmol/L — ABNORMAL HIGH (ref 0.0–2.0)
BICARBONATE: 14.7 mmol/L — AB (ref 20.0–28.0)
Bicarbonate: 15.1 mmol/L — ABNORMAL LOW (ref 20.0–28.0)
Bicarbonate: 16.5 mmol/L — ABNORMAL LOW (ref 20.0–28.0)
Bicarbonate: 17.6 mmol/L — ABNORMAL LOW (ref 20.0–28.0)
O2 SAT: 96 %
O2 Saturation: 93 %
O2 Saturation: 97 %
O2 Saturation: 98 %
Patient temperature: 101.1
Patient temperature: 37.1
Patient temperature: 37.2
TCO2: 15 mmol/L — ABNORMAL LOW (ref 22–32)
TCO2: 16 mmol/L — ABNORMAL LOW (ref 22–32)
TCO2: 18 mmol/L — ABNORMAL LOW (ref 22–32)
TCO2: 19 mmol/L — AB (ref 22–32)
pCO2 arterial: 21 mmHg — ABNORMAL LOW (ref 32.0–48.0)
pCO2 arterial: 32.1 mmHg (ref 32.0–48.0)
pCO2 arterial: 34.5 mmHg (ref 32.0–48.0)
pCO2 arterial: 41.5 mmHg (ref 32.0–48.0)
pH, Arterial: 7.216 — ABNORMAL LOW (ref 7.350–7.450)
pH, Arterial: 7.257 — ABNORMAL LOW (ref 7.350–7.450)
pH, Arterial: 7.348 — ABNORMAL LOW (ref 7.350–7.450)
pH, Arterial: 7.454 — ABNORMAL HIGH (ref 7.350–7.450)
pO2, Arterial: 105 mmHg (ref 83.0–108.0)
pO2, Arterial: 112 mmHg — ABNORMAL HIGH (ref 83.0–108.0)
pO2, Arterial: 85 mmHg (ref 83.0–108.0)
pO2, Arterial: 88 mmHg (ref 83.0–108.0)

## 2018-05-02 LAB — POCT I-STAT 7, (LYTES, BLD GAS, ICA,H+H)
ACID-BASE DEFICIT: 14 mmol/L — AB (ref 0.0–2.0)
Acid-base deficit: 12 mmol/L — ABNORMAL HIGH (ref 0.0–2.0)
Acid-base deficit: 14 mmol/L — ABNORMAL HIGH (ref 0.0–2.0)
Acid-base deficit: 14 mmol/L — ABNORMAL HIGH (ref 0.0–2.0)
Acid-base deficit: 15 mmol/L — ABNORMAL HIGH (ref 0.0–2.0)
Acid-base deficit: 6 mmol/L — ABNORMAL HIGH (ref 0.0–2.0)
Acid-base deficit: 7 mmol/L — ABNORMAL HIGH (ref 0.0–2.0)
Acid-base deficit: 9 mmol/L — ABNORMAL HIGH (ref 0.0–2.0)
BICARBONATE: 14.3 mmol/L — AB (ref 20.0–28.0)
BICARBONATE: 15.1 mmol/L — AB (ref 20.0–28.0)
Bicarbonate: 14.5 mmol/L — ABNORMAL LOW (ref 20.0–28.0)
Bicarbonate: 16.4 mmol/L — ABNORMAL LOW (ref 20.0–28.0)
Bicarbonate: 17.3 mmol/L — ABNORMAL LOW (ref 20.0–28.0)
Bicarbonate: 19.5 mmol/L — ABNORMAL LOW (ref 20.0–28.0)
Bicarbonate: 19.9 mmol/L — ABNORMAL LOW (ref 20.0–28.0)
Bicarbonate: 21.3 mmol/L (ref 20.0–28.0)
CALCIUM ION: 0.61 mmol/L — AB (ref 1.15–1.40)
CALCIUM ION: 0.83 mmol/L — AB (ref 1.15–1.40)
Calcium, Ion: 0.68 mmol/L — CL (ref 1.15–1.40)
Calcium, Ion: 0.8 mmol/L — CL (ref 1.15–1.40)
Calcium, Ion: 0.92 mmol/L — ABNORMAL LOW (ref 1.15–1.40)
Calcium, Ion: 0.98 mmol/L — ABNORMAL LOW (ref 1.15–1.40)
Calcium, Ion: 0.82 mmol/L — CL (ref 1.15–1.40)
Calcium, Ion: 0.91 mmol/L — ABNORMAL LOW (ref 1.15–1.40)
HCT: 22 % — ABNORMAL LOW (ref 39.0–52.0)
HCT: 22 % — ABNORMAL LOW (ref 39.0–52.0)
HCT: 29 % — ABNORMAL LOW (ref 39.0–52.0)
HCT: 39 % (ref 39.0–52.0)
HCT: 42 % (ref 39.0–52.0)
HCT: 29 % — ABNORMAL LOW (ref 39.0–52.0)
HCT: 31 % — ABNORMAL LOW (ref 39.0–52.0)
HCT: 32 % — ABNORMAL LOW (ref 39.0–52.0)
Hemoglobin: 13.3 g/dL (ref 13.0–17.0)
Hemoglobin: 14.3 g/dL (ref 13.0–17.0)
Hemoglobin: 7.5 g/dL — ABNORMAL LOW (ref 13.0–17.0)
Hemoglobin: 7.5 g/dL — ABNORMAL LOW (ref 13.0–17.0)
Hemoglobin: 9.9 g/dL — ABNORMAL LOW (ref 13.0–17.0)
Hemoglobin: 10.5 g/dL — ABNORMAL LOW (ref 13.0–17.0)
Hemoglobin: 10.9 g/dL — ABNORMAL LOW (ref 13.0–17.0)
Hemoglobin: 9.9 g/dL — ABNORMAL LOW (ref 13.0–17.0)
O2 SAT: 83 %
O2 SAT: 100 %
O2 Saturation: 100 %
O2 Saturation: 100 %
O2 Saturation: 81 %
O2 Saturation: 99 %
O2 Saturation: 99 %
O2 Saturation: 96 %
PCO2 ART: 43.7 mmHg (ref 32.0–48.0)
PO2 ART: 90 mmHg (ref 83.0–108.0)
Patient temperature: 34.4
Patient temperature: 36.1
Patient temperature: 36.5
Potassium: 4.6 mmol/L (ref 3.5–5.1)
Potassium: 5 mmol/L (ref 3.5–5.1)
Potassium: 5.1 mmol/L (ref 3.5–5.1)
Potassium: 5.2 mmol/L — ABNORMAL HIGH (ref 3.5–5.1)
Potassium: 5.6 mmol/L — ABNORMAL HIGH (ref 3.5–5.1)
Potassium: 4.4 mmol/L (ref 3.5–5.1)
Potassium: 4.5 mmol/L (ref 3.5–5.1)
Potassium: 5.2 mmol/L — ABNORMAL HIGH (ref 3.5–5.1)
SODIUM: 143 mmol/L (ref 135–145)
SODIUM: 140 mmol/L (ref 135–145)
Sodium: 142 mmol/L (ref 135–145)
Sodium: 144 mmol/L (ref 135–145)
Sodium: 144 mmol/L (ref 135–145)
Sodium: 146 mmol/L — ABNORMAL HIGH (ref 135–145)
Sodium: 140 mmol/L (ref 135–145)
Sodium: 141 mmol/L (ref 135–145)
TCO2: 16 mmol/L — ABNORMAL LOW (ref 22–32)
TCO2: 16 mmol/L — ABNORMAL LOW (ref 22–32)
TCO2: 18 mmol/L — ABNORMAL LOW (ref 22–32)
TCO2: 19 mmol/L — ABNORMAL LOW (ref 22–32)
TCO2: 21 mmol/L — ABNORMAL LOW (ref 22–32)
TCO2: 21 mmol/L — ABNORMAL LOW (ref 22–32)
TCO2: 23 mmol/L (ref 22–32)
TCO2: 17 mmol/L — AB (ref 22–32)
pCO2 arterial: 44.5 mmHg (ref 32.0–48.0)
pCO2 arterial: 46.4 mmHg (ref 32.0–48.0)
pCO2 arterial: 48.4 mmHg — ABNORMAL HIGH (ref 32.0–48.0)
pCO2 arterial: 66.5 mmHg (ref 32.0–48.0)
pCO2 arterial: 56.1 mmHg — ABNORMAL HIGH (ref 32.0–48.0)
pCO2 arterial: 43.6 mmHg (ref 32.0–48.0)
pCO2 arterial: 48.7 mmHg — ABNORMAL HIGH (ref 32.0–48.0)
pH, Arterial: 7.023 — CL (ref 7.350–7.450)
pH, Arterial: 7.097 — CL (ref 7.350–7.450)
pH, Arterial: 7.098 — CL (ref 7.350–7.450)
pH, Arterial: 7.1 — CL (ref 7.350–7.450)
pH, Arterial: 7.183 — CL (ref 7.350–7.450)
pH, Arterial: 7.149 — CL (ref 7.350–7.450)
pH, Arterial: 7.248 — ABNORMAL LOW (ref 7.350–7.450)
pH, Arterial: 7.267 — ABNORMAL LOW (ref 7.350–7.450)
pO2, Arterial: 156 mmHg — ABNORMAL HIGH (ref 83.0–108.0)
pO2, Arterial: 156 mmHg — ABNORMAL HIGH (ref 83.0–108.0)
pO2, Arterial: 471 mmHg — ABNORMAL HIGH (ref 83.0–108.0)
pO2, Arterial: 495 mmHg — ABNORMAL HIGH (ref 83.0–108.0)
pO2, Arterial: 67 mmHg — ABNORMAL LOW (ref 83.0–108.0)
pO2, Arterial: 273 mmHg — ABNORMAL HIGH (ref 83.0–108.0)
pO2, Arterial: 61 mmHg — ABNORMAL LOW (ref 83.0–108.0)

## 2018-05-02 LAB — POCT I-STAT 4, (NA,K, GLUC, HGB,HCT)
Glucose, Bld: 173 mg/dL — ABNORMAL HIGH (ref 70–99)
Glucose, Bld: 138 mg/dL — ABNORMAL HIGH (ref 70–99)
HCT: 36 % — ABNORMAL LOW (ref 39.0–52.0)
HCT: 29 % — ABNORMAL LOW (ref 39.0–52.0)
HEMOGLOBIN: 9.9 g/dL — AB (ref 13.0–17.0)
Hemoglobin: 12.2 g/dL — ABNORMAL LOW (ref 13.0–17.0)
POTASSIUM: 4.2 mmol/L (ref 3.5–5.1)
Potassium: 4.8 mmol/L (ref 3.5–5.1)
Sodium: 148 mmol/L — ABNORMAL HIGH (ref 135–145)
Sodium: 142 mmol/L (ref 135–145)

## 2018-05-02 LAB — BASIC METABOLIC PANEL
Anion gap: 13 (ref 5–15)
BUN: 26 mg/dL — ABNORMAL HIGH (ref 6–20)
CO2: 14 mmol/L — ABNORMAL LOW (ref 22–32)
Calcium: 5.7 mg/dL — CL (ref 8.9–10.3)
Chloride: 112 mmol/L — ABNORMAL HIGH (ref 98–111)
Creatinine, Ser: 3.18 mg/dL — ABNORMAL HIGH (ref 0.61–1.24)
GFR calc Af Amer: 25 mL/min — ABNORMAL LOW (ref >60)
GFR calc non Af Amer: 21 mL/min — ABNORMAL LOW (ref >60)
GLUCOSE: 146 mg/dL — AB (ref 70–99)
Potassium: 4.6 mmol/L (ref 3.5–5.1)
Sodium: 139 mmol/L (ref 135–145)

## 2018-05-02 LAB — GLUCOSE, CAPILLARY
Glucose-Capillary: 127 mg/dL — ABNORMAL HIGH (ref 70–99)
Glucose-Capillary: 142 mg/dL — ABNORMAL HIGH (ref 70–99)
Glucose-Capillary: 159 mg/dL — ABNORMAL HIGH (ref 70–99)
Glucose-Capillary: 155 mg/dL — ABNORMAL HIGH (ref 70–99)
Glucose-Capillary: 171 mg/dL — ABNORMAL HIGH (ref 70–99)

## 2018-05-02 LAB — LACTIC ACID, PLASMA: Lactic Acid, Venous: 2.7 mmol/L (ref 0.5–1.9)

## 2018-05-02 LAB — CBC
HCT: 36.5 % — ABNORMAL LOW (ref 39.0–52.0)
Hemoglobin: 13.6 g/dL (ref 13.0–17.0)
MCH: 31.7 pg (ref 26.0–34.0)
MCHC: 37.3 g/dL — ABNORMAL HIGH (ref 30.0–36.0)
MCV: 85.1 fL (ref 80.0–100.0)
Platelets: 58 10*3/uL — ABNORMAL LOW (ref 150–400)
RBC: 4.29 MIL/uL (ref 4.22–5.81)
RDW: 15.5 % (ref 11.5–15.5)
WBC: 9.5 10*3/uL (ref 4.0–10.5)
nRBC: 0.3 % — ABNORMAL HIGH (ref 0.0–0.2)

## 2018-05-02 LAB — BLOOD PRODUCT ORDER (VERBAL) VERIFICATION

## 2018-05-02 SURGERY — EXTERNAL FIXATION, PELVIS
Anesthesia: General | Site: Leg Upper | Laterality: Right

## 2018-05-02 MED ORDER — FUROSEMIDE 10 MG/ML IJ SOLN
80.0000 mg | Freq: Two times a day (BID) | INTRAMUSCULAR | Status: DC
  Administered 2018-05-02: 80 mg via INTRAVENOUS
  Filled 2018-05-02: qty 8

## 2018-05-02 MED ORDER — PHENYLEPHRINE HCL-NACL 40-0.9 MG/250ML-% IV SOLN: 0.0000 ug/min | INTRAVENOUS | Status: DC

## 2018-05-02 MED ORDER — PHENYLEPHRINE HCL-NACL 40-0.9 MG/250ML-% IV SOLN
0.0000 ug/min | INTRAVENOUS | Status: DC
  Filled 2018-05-02 (×2): qty 250

## 2018-05-02 MED ORDER — SODIUM CHLORIDE 0.9 % IV SOLN
0.0000 ug/min | INTRAVENOUS | Status: DC
  Administered 2018-05-02: 20 ug/min via INTRAVENOUS
  Administered 2018-05-03: 340 ug/min via INTRAVENOUS
  Filled 2018-05-02: qty 40
  Filled 2018-05-02: qty 4

## 2018-05-02 MED ORDER — CALCIUM GLUCONATE-NACL 1-0.675 GM/50ML-% IV SOLN
1.0000 g | Freq: Once | INTRAVENOUS | Status: AC
  Administered 2018-05-02: 1000 mg via INTRAVENOUS
  Filled 2018-05-02: qty 50

## 2018-05-02 MED ORDER — MIDAZOLAM HCL 2 MG/2ML IJ SOLN
INTRAMUSCULAR | Status: AC
  Filled 2018-05-02: qty 4

## 2018-05-02 MED ORDER — MIDAZOLAM HCL 2 MG/2ML IJ SOLN
INTRAMUSCULAR | Status: DC | PRN
  Administered 2018-05-02 (×2): 2 mg via INTRAVENOUS

## 2018-05-02 MED ORDER — CHLORHEXIDINE GLUCONATE CLOTH 2 % EX PADS
6.0000 | MEDICATED_PAD | Freq: Once | CUTANEOUS | Status: AC
  Administered 2018-05-02: 6 via TOPICAL

## 2018-05-02 MED ORDER — SODIUM CHLORIDE 0.9 % IV BOLUS
2000.0000 mL | Freq: Once | INTRAVENOUS | Status: AC
  Administered 2018-05-02: 2000 mL via INTRAVENOUS

## 2018-05-02 MED ORDER — HYDROMORPHONE HCL 1 MG/ML IJ SOLN
4.0000 mg | Freq: Once | INTRAMUSCULAR | Status: AC
  Administered 2018-05-02: 4 mg via INTRAVENOUS
  Filled 2018-05-02: qty 4

## 2018-05-02 MED ORDER — FENTANYL CITRATE (PF) 250 MCG/5ML IJ SOLN
INTRAMUSCULAR | Status: AC
  Filled 2018-05-02: qty 5

## 2018-05-02 MED ORDER — ACETAMINOPHEN 160 MG/5ML PO SOLN
650.0000 mg | Freq: Four times a day (QID) | ORAL | Status: DC | PRN
  Administered 2018-05-02: 650 mg via ORAL
  Filled 2018-05-02: qty 20.3

## 2018-05-02 MED ORDER — FENTANYL CITRATE (PF) 250 MCG/5ML IJ SOLN
INTRAMUSCULAR | Status: DC | PRN
  Administered 2018-05-02 (×4): 50 ug via INTRAVENOUS
  Administered 2018-05-02 (×3): 100 ug via INTRAVENOUS

## 2018-05-02 MED ORDER — FUROSEMIDE 10 MG/ML IJ SOLN
INTRAMUSCULAR | Status: DC | PRN
  Administered 2018-05-02: 20 mg via INTRAMUSCULAR

## 2018-05-02 MED ORDER — ROCURONIUM BROMIDE 50 MG/5ML IV SOSY
PREFILLED_SYRINGE | INTRAVENOUS | Status: AC
  Filled 2018-05-02: qty 5

## 2018-05-02 MED ORDER — 0.9 % SODIUM CHLORIDE (POUR BTL) OPTIME
TOPICAL | Status: DC | PRN
  Administered 2018-05-02: 1000 mL

## 2018-05-02 MED ORDER — NOREPINEPHRINE BITARTRATE 1 MG/ML IV SOLN
0.0000 ug/min | INTRAVENOUS | Status: DC
  Administered 2018-05-02: 22 ug/min via INTRAVENOUS
  Administered 2018-05-02: 2 ug/min via INTRAVENOUS
  Administered 2018-05-02: 20 ug/min via INTRAVENOUS
  Administered 2018-05-04: 5 ug/min via INTRAVENOUS
  Administered 2018-05-04: 20 ug/min via INTRAVENOUS
  Filled 2018-05-02 (×5): qty 4

## 2018-05-02 MED ORDER — LIDOCAINE 2% (20 MG/ML) 5 ML SYRINGE
INTRAMUSCULAR | Status: AC
  Filled 2018-05-02: qty 5

## 2018-05-02 MED ORDER — FUROSEMIDE 10 MG/ML IJ SOLN
INTRAMUSCULAR | Status: AC
  Filled 2018-05-02: qty 4

## 2018-05-02 MED ORDER — DEXTROSE 5 % IV SOLN
3.0000 g | INTRAVENOUS | Status: DC
  Filled 2018-05-02: qty 3000

## 2018-05-02 MED ORDER — SODIUM BICARBONATE 8.4 % IV SOLN
INTRAVENOUS | Status: DC | PRN
  Administered 2018-05-02 (×3): 50 meq via INTRAVENOUS

## 2018-05-02 MED ORDER — EPHEDRINE 5 MG/ML INJ
INTRAVENOUS | Status: AC
  Filled 2018-05-02: qty 10

## 2018-05-02 MED ORDER — PROPOFOL 10 MG/ML IV BOLUS
INTRAVENOUS | Status: AC
  Filled 2018-05-02: qty 20

## 2018-05-02 MED ORDER — ROCURONIUM BROMIDE 100 MG/10ML IV SOLN
INTRAVENOUS | Status: DC | PRN
  Administered 2018-05-02: 30 mg via INTRAVENOUS
  Administered 2018-05-02: 50 mg via INTRAVENOUS
  Administered 2018-05-02: 20 mg via INTRAVENOUS

## 2018-05-02 MED ORDER — PHENYLEPHRINE HCL-NACL 40-0.9 MG/250ML-% IV SOLN
0.0000 ug/min | INTRAVENOUS | Status: DC
  Filled 2018-05-02: qty 250

## 2018-05-02 MED ORDER — SODIUM CHLORIDE 0.9 % IV SOLN
INTRAVENOUS | Status: DC | PRN
  Administered 2018-05-02: 08:00:00 via INTRAVENOUS

## 2018-05-02 SURGICAL SUPPLY — 69 items
6.0x250mm shantz pin (Pin) ×12 IMPLANT
BANDAGE ACE 4X5 VEL STRL LF (GAUZE/BANDAGES/DRESSINGS) ×6 IMPLANT
BANDAGE ACE 6X5 VEL STRL LF (GAUZE/BANDAGES/DRESSINGS) ×6 IMPLANT
BENZOIN TINCTURE PRP APPL 2/3 (GAUZE/BANDAGES/DRESSINGS) ×18 IMPLANT
BIT DRILL CANN 4.5MM (BIT) ×8 IMPLANT
BLADE SURG 11 STRL SS (BLADE) ×6 IMPLANT
BNDG GAUZE ELAST 4 BULKY (GAUZE/BANDAGES/DRESSINGS) ×12 IMPLANT
BRUSH SCRUB SURG 4.25 DISP (MISCELLANEOUS) ×12 IMPLANT
CANISTER SUCT 3000ML PPV (MISCELLANEOUS) ×6 IMPLANT
CANISTER WOUND CARE 500ML ATS (WOUND CARE) ×12 IMPLANT
CHLORAPREP W/TINT 26ML (MISCELLANEOUS) ×18 IMPLANT
CLAMP LG COMBINATION (Clamp) ×18 IMPLANT
CLAMP LG MULTI PIN (Clamp) ×12 IMPLANT
CLAMP ROD ATTACHMENT (Clamp) ×12 IMPLANT
CONT SPEC 4OZ CLIKSEAL STRL BL (MISCELLANEOUS) ×6 IMPLANT
COVER SURGICAL LIGHT HANDLE (MISCELLANEOUS) ×18 IMPLANT
COVER WAND RF STERILE (DRAPES) ×12 IMPLANT
DRAPE C-ARM 42X72 X-RAY (DRAPES) ×12 IMPLANT
DRAPE C-ARMOR (DRAPES) IMPLANT
DRAPE IMP U-DRAPE 54X76 (DRAPES) ×6 IMPLANT
DRAPE INCISE IOBAN 66X45 STRL (DRAPES) ×6 IMPLANT
DRAPE INCISE IOBAN 85X60 (DRAPES) ×6 IMPLANT
DRAPE LAPAROSCOPIC ABDOMINAL (DRAPES) ×6 IMPLANT
DRAPE SURG 17X23 STRL (DRAPES) ×36 IMPLANT
DRAPE U-SHAPE 47X51 STRL (DRAPES) ×6 IMPLANT
DRILL BIT CANN 4.5MM (BIT) ×4
DRSG ADAPTIC 3X8 NADH LF (GAUZE/BANDAGES/DRESSINGS) ×6 IMPLANT
DRSG EMULSION OIL 3X3 NADH (GAUZE/BANDAGES/DRESSINGS) ×6 IMPLANT
DRSG PAD ABDOMINAL 8X10 ST (GAUZE/BANDAGES/DRESSINGS) ×6 IMPLANT
ELECT REM PT RETURN 9FT ADLT (ELECTROSURGICAL) ×12
ELECTRODE REM PT RTRN 9FT ADLT (ELECTROSURGICAL) ×8 IMPLANT
GAUZE SPONGE 4X4 12PLY STRL (GAUZE/BANDAGES/DRESSINGS) ×6 IMPLANT
GLOVE BIO SURGEON STRL SZ 6.5 (GLOVE) ×15 IMPLANT
GLOVE BIO SURGEON STRL SZ7.5 (GLOVE) ×24 IMPLANT
GLOVE BIO SURGEONS STRL SZ 6.5 (GLOVE) ×3
GLOVE BIOGEL PI IND STRL 6.5 (GLOVE) ×4 IMPLANT
GLOVE BIOGEL PI IND STRL 7.5 (GLOVE) ×8 IMPLANT
GLOVE BIOGEL PI INDICATOR 6.5 (GLOVE) ×2
GLOVE BIOGEL PI INDICATOR 7.5 (GLOVE) ×4
GLOVE SURG SS PI 7.0 STRL IVOR (GLOVE) ×6 IMPLANT
GOWN STRL REUS W/ TWL LRG LVL3 (GOWN DISPOSABLE) ×16 IMPLANT
GOWN STRL REUS W/TWL LRG LVL3 (GOWN DISPOSABLE) ×8
GUIDEWIRE 2.0MM (WIRE) ×6 IMPLANT
KIT BASIN OR (CUSTOM PROCEDURE TRAY) ×12 IMPLANT
KIT TURNOVER KIT B (KITS) ×12 IMPLANT
MANIFOLD NEPTUNE II (INSTRUMENTS) ×6 IMPLANT
NEEDLE 22X1 1/2 (OR ONLY) (NEEDLE) ×6 IMPLANT
NS IRRIG 1000ML POUR BTL (IV SOLUTION) ×12 IMPLANT
PACK GENERAL/GYN (CUSTOM PROCEDURE TRAY) ×6 IMPLANT
PACK TOTAL JOINT (CUSTOM PROCEDURE TRAY) ×12 IMPLANT
PACK UNIVERSAL I (CUSTOM PROCEDURE TRAY) ×6 IMPLANT
PAD ARMBOARD 7.5X6 YLW CONV (MISCELLANEOUS) ×24 IMPLANT
PADDING CAST COTTON 6X4 STRL (CAST SUPPLIES) ×18 IMPLANT
ROD CARBON FIBER 550MM (Rod) ×12 IMPLANT
ROD CRBN FBR LRG EX-FX 11X300 (Rod) ×6 IMPLANT
SCREW SCHANZ EXFX THRD 6X250 (EXFIX) ×12 IMPLANT
SCREW SHANZ 5X250MM (Screw) ×12 IMPLANT
SPONGE ABD ABTHERA ADVANCE (MISCELLANEOUS) ×6 IMPLANT
SPONGE ABDOMINAL VAC ABTHERA (MISCELLANEOUS) ×6 IMPLANT
SPONGE LAP 18X18 X RAY DECT (DISPOSABLE) ×6 IMPLANT
STAPLER VISISTAT 35W (STAPLE) ×6 IMPLANT
SUCTION POOLE TIP (SUCTIONS) ×6 IMPLANT
SUT MNCRL AB 3-0 PS2 18 (SUTURE) ×6 IMPLANT
SUT MON AB 2-0 CT1 36 (SUTURE) ×6 IMPLANT
SUT VIC AB 2-0 FS1 27 (SUTURE) ×6 IMPLANT
TOWEL OR 17X24 6PK STRL BLUE (TOWEL DISPOSABLE) ×18 IMPLANT
TOWEL OR 17X26 10 PK STRL BLUE (TOWEL DISPOSABLE) ×18 IMPLANT
UNDERPAD 30X30 (UNDERPADS AND DIAPERS) ×6 IMPLANT
WATER STERILE IRR 1000ML POUR (IV SOLUTION) ×12 IMPLANT

## 2018-05-02 NOTE — Anesthesia Procedure Notes (Signed)
Date/Time: 06/23/17 7:38 AM Performed by: Rosiland OzMeyers, Saveah Bahar, CRNA Pre-anesthesia Checklist: Patient identified, Emergency Drugs available, Suction available, Patient being monitored and Timeout performed Patient Re-evaluated:Patient Re-evaluated prior to induction Oxygen Delivery Method: Circle system utilized Preoxygenation: Pre-oxygenation with 100% oxygen Induction Type: Inhalational induction Comments: ETT indwelling upon arrival to OR

## 2018-05-02 NOTE — Progress Notes (Signed)
Interventional Radiology  Before removing the sheath there was a bruise inferior to the site and the RN observed some bleeding at the site overnight.  Upon further inspection the sheath was noted to be kinked.  The 5 french bracial sheath was then removed at 1049.  VPAD and manual pressure applied to achieve hemostasis at 1110.  The site was reviewed with Ted McalpineEmily RN.  Pressure dressing applied.  No acute complications.  Brachial and radial pulses were dopplered. [facility] Hospital[facility]inda Alewine Kathia Covington
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
## 2018-05-02 NOTE — Progress Notes (Signed)
Pt with low UO 17300ml/hr MD order for 2L NS bolus.

## 2018-05-02 NOTE — Anesthesia Preprocedure Evaluation (Signed)
Anesthesia Evaluation  Patient identified by MRN, date of birth, ID band Patient awake    Reviewed: Allergy & Precautions, NPO status , Patient's Chart, lab work & pertinent test results  Airway Mallampati: Intubated       Dental   Pulmonary    Pulmonary exam normal        Cardiovascular hypertension, Pt. on medications Normal cardiovascular exam     Neuro/Psych    GI/Hepatic   Endo/Other  diabetes, Type 2  Renal/GU      Musculoskeletal   Abdominal   Peds  Hematology   Anesthesia Other Findings   Reproductive/Obstetrics                             Anesthesia Physical Anesthesia Plan  ASA: III  Anesthesia Plan: General   Post-op Pain Management:    Induction: Intravenous  PONV Risk Score and Plan: 2 and Treatment may vary due to age or medical condition  Airway Management Planned: Oral ETT  Additional Equipment: PA Cath and Ultrasound Guidance Line Placement  Intra-op Plan:   Post-operative Plan: Post-operative intubation/ventilation  Informed Consent: I have reviewed the patients History and Physical, chart, labs and discussed the procedure including the risks, benefits and alternatives for the proposed anesthesia with the patient or authorized representative who has indicated his/her understanding and acceptance.     Plan Discussed with: CRNA and Surgeon  Anesthesia Plan Comments:         Anesthesia Quick Evaluation
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
## 2018-05-02 NOTE — Anesthesia Procedure Notes (Addendum)
Central Venous Catheter Insertion Performed by: Arta Brucessey, Meriah Shands, MD, anesthesiologist Start/End12/05/2017 8:24 AM, 05/02/2018 8:34 AM Patient location: OR. Preanesthetic checklist: patient identified, IV checked, risks and benefits discussed, surgical consent, monitors and equipment checked, pre-op evaluation, timeout performed and anesthesia consent Position: Trendelenburg Lidocaine 1% used for infiltration and patient sedated Hand hygiene performed  and maximum sterile barriers used  Catheter size: 8.5 Fr Central line and PA cath was placed.Sheath introducer Swan type:thermodilution Procedure performed using ultrasound guided technique. Ultrasound Notes:anatomy identified, needle tip was noted to be adjacent to the nerve/plexus identified, no ultrasound evidence of intravascular and/or intraneural injection and image(s) printed for medical record Attempts: 1 Following insertion, line sutured, dressing applied and Biopatch. Post procedure assessment: blood return through all ports, free fluid flow and no air  Patient tolerated the procedure well with no immediate complications.

## 2018-05-02 NOTE — Progress Notes (Signed)
Critical Lab values called to trauma MD. MD to enter orders.

## 2018-05-02 NOTE — Progress Notes (Addendum)
Trauma MD made aware of high PAP, tachycardia, and low UO after lasix administration. No new orders. MD order to watch UO overnight.
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
## 2018-05-02 NOTE — Transfer of Care (Signed)
Immediate Anesthesia Transfer of Care Note  Patient: Bruce Henson  Procedure(s) Performed: EXTERNAL FIXATION PELVIS (N/A ) EXTERNAL FIXATION RIGHT FEMUR (Right Leg Upper) SECONDARY CLOSURE OF WOUND LEFT ANTERIOR TIBIA (Left Leg Lower) ABDOMINAL WOUND VACUUM CHANGE (N/A )  Patient Location: ICU  Anesthesia Type:General  Level of Consciousness: sedated and patient cooperative  Airway & Oxygen Therapy: Patient remains intubated per anesthesia plan and Patient placed on Ventilator (see vital sign flow sheet for setting)  Post-op Assessment: Report given to RN and Post -op Vital signs reviewed and stable  Post vital signs: Reviewed and stable  Last Vitals:  Vitals Value Taken Time  BP    Temp 35.1 C 04/27/2018 10:19 AM  Pulse 109 04/18/2018 10:19 AM  Resp 27 04/22/2018 10:19 AM  SpO2 94 % 04/19/2018 10:19 AM  Vitals shown include unvalidated device data.  Last Pain:  Vitals:   04/14/2018 2050  TempSrc: Esophageal         Complications: No apparent anesthesia complications
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
## 2018-05-02 NOTE — Progress Notes (Signed)
MD made aware of blood gas results. MD to enter orders.
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
## 2018-05-02 NOTE — Progress Notes (Signed)
Agarwala, MD notified of elevated heart rate to the 130s & increased CVP. Orders received for a one-time dose of 4mg  Dilaudid, after administration there was no change in heart rate. MD states HR in the 130s is acceptable at this time.
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
## 2018-05-02 NOTE — Anesthesia Postprocedure Evaluation (Signed)
Anesthesia Post Note  Patient: Bruce Henson  Procedure(s) Performed: EXTERNAL FIXATION PELVIS (N/A ) EXTERNAL FIXATION RIGHT FEMUR (Right Leg Upper) SECONDARY CLOSURE OF WOUND LEFT ANTERIOR TIBIA (Left Leg Lower) ABDOMINAL WOUND VACUUM CHANGE (N/A )     Patient location during evaluation: SICU Anesthesia Type: General Level of consciousness: sedated Pain management: pain level controlled Vital Signs Assessment: post-procedure vital signs reviewed and stable Respiratory status: patient remains intubated per anesthesia plan Cardiovascular status: stable Postop Assessment: no apparent nausea or vomiting Anesthetic complications: no    Last Vitals:  Vitals:   12-07-17 1330 12-07-17 1400  BP:  116/77  Pulse: (!) 102 (!) 106  Resp: (!) 26 (!) 26  Temp: (!) 36.1 C (!) 36.4 C  SpO2: 98% 99%    Last Pain:  Vitals:   04/30/2018 2050  TempSrc: Esophageal                 Darwyn Ponzo DAVID

## 2018-05-02 NOTE — OR Nursing (Signed)
0930: five intact 18x18 lap sponges removed from abdominal wound while discontinuing abdominal vac sponge, disposed of in the red bin per routine.

## 2018-05-03 ENCOUNTER — Inpatient Hospital Stay (HOSPITAL_COMMUNITY): Payer: 59

## 2018-05-03 LAB — TYPE AND SCREEN
ABO/RH(D): O POS
Antibody Screen: NEGATIVE
UNIT DIVISION: 0
UNIT DIVISION: 0
UNIT DIVISION: 0
UNIT DIVISION: 0
UNIT DIVISION: 0
UNIT DIVISION: 0
Unit division: 0
Unit division: 0
Unit division: 0
Unit division: 0
Unit division: 0
Unit division: 0
Unit division: 0
Unit division: 0
Unit division: 0
Unit division: 0
Unit division: 0
Unit division: 0
Unit division: 0
Unit division: 0
Unit division: 0
Unit division: 0
Unit division: 0
Unit division: 0
Unit division: 0
Unit division: 0
Unit division: 0
Unit division: 0
Unit division: 0
Unit division: 0
Unit division: 0
Unit division: 0
Unit division: 0
Unit division: 0
Unit division: 0
Unit division: 0
Unit division: 0
Unit division: 0
Unit division: 0
Unit division: 0

## 2018-05-03 LAB — PREPARE FRESH FROZEN PLASMA
UNIT DIVISION: 0
UNIT DIVISION: 0
UNIT DIVISION: 0
UNIT DIVISION: 0
UNIT DIVISION: 0
Unit division: 0
Unit division: 0
Unit division: 0
Unit division: 0
Unit division: 0
Unit division: 0
Unit division: 0
Unit division: 0
Unit division: 0
Unit division: 0
Unit division: 0
Unit division: 0
Unit division: 0
Unit division: 0
Unit division: 0
Unit division: 0
Unit division: 0
Unit division: 0
Unit division: 0
Unit division: 0
Unit division: 0
Unit division: 0
Unit division: 0
Unit division: 0

## 2018-05-03 LAB — BPAM RBC
BLOOD PRODUCT EXPIRATION DATE: 202001102359
BLOOD PRODUCT EXPIRATION DATE: 202001192359
BLOOD PRODUCT EXPIRATION DATE: 202001202359
BLOOD PRODUCT EXPIRATION DATE: 202001232359
BLOOD PRODUCT EXPIRATION DATE: 202001242359
BLOOD PRODUCT EXPIRATION DATE: 202001242359
Blood Product Expiration Date: 202001082359
Blood Product Expiration Date: 202001082359
Blood Product Expiration Date: 202001102359
Blood Product Expiration Date: 202001192359
Blood Product Expiration Date: 202001192359
Blood Product Expiration Date: 202001192359
Blood Product Expiration Date: 202001192359
Blood Product Expiration Date: 202001192359
Blood Product Expiration Date: 202001192359
Blood Product Expiration Date: 202001192359
Blood Product Expiration Date: 202001192359
Blood Product Expiration Date: 202001192359
Blood Product Expiration Date: 202001192359
Blood Product Expiration Date: 202001192359
Blood Product Expiration Date: 202001192359
Blood Product Expiration Date: 202001192359
Blood Product Expiration Date: 202001202359
Blood Product Expiration Date: 202001202359
Blood Product Expiration Date: 202001232359
Blood Product Expiration Date: 202001232359
Blood Product Expiration Date: 202001232359
Blood Product Expiration Date: 202001232359
Blood Product Expiration Date: 202001232359
Blood Product Expiration Date: 202001232359
Blood Product Expiration Date: 202001232359
Blood Product Expiration Date: 202001232359
Blood Product Expiration Date: 202001242359
Blood Product Expiration Date: 202001242359
Blood Product Expiration Date: 202001242359
Blood Product Expiration Date: 202001242359
Blood Product Expiration Date: 202001242359
Blood Product Expiration Date: 202001242359
Blood Product Expiration Date: 202001242359
Blood Product Expiration Date: 202001242359
ISSUE DATE / TIME: 201912281836
ISSUE DATE / TIME: 201912281836
ISSUE DATE / TIME: 201912281909
ISSUE DATE / TIME: 201912281909
ISSUE DATE / TIME: 201912281916
ISSUE DATE / TIME: 201912281916
ISSUE DATE / TIME: 201912281923
ISSUE DATE / TIME: 201912281923
ISSUE DATE / TIME: 201912281928
ISSUE DATE / TIME: 201912281928
ISSUE DATE / TIME: 201912281934
ISSUE DATE / TIME: 201912281934
ISSUE DATE / TIME: 201912281953
ISSUE DATE / TIME: 201912281953
ISSUE DATE / TIME: 201912281953
ISSUE DATE / TIME: 201912281953
ISSUE DATE / TIME: 201912282042
ISSUE DATE / TIME: 201912282042
ISSUE DATE / TIME: 201912282042
ISSUE DATE / TIME: 201912282042
ISSUE DATE / TIME: 201912282105
ISSUE DATE / TIME: 201912282105
ISSUE DATE / TIME: 201912282105
ISSUE DATE / TIME: 201912282105
ISSUE DATE / TIME: 201912282129
ISSUE DATE / TIME: 201912282129
ISSUE DATE / TIME: 201912282129
ISSUE DATE / TIME: 201912282129
ISSUE DATE / TIME: 201912282152
ISSUE DATE / TIME: 201912282242
ISSUE DATE / TIME: 201912290521
ISSUE DATE / TIME: 201912291018
ISSUE DATE / TIME: 201912291432
ISSUE DATE / TIME: 201912291530
ISSUE DATE / TIME: 201912291534
ISSUE DATE / TIME: 201912291534
ISSUE DATE / TIME: 201912291808
ISSUE DATE / TIME: 201912301815
ISSUE DATE / TIME: 201912302035
ISSUE DATE / TIME: 201912292042
UNIT TYPE AND RH: 5100
UNIT TYPE AND RH: 5100
UNIT TYPE AND RH: 5100
UNIT TYPE AND RH: 5100
UNIT TYPE AND RH: 5100
UNIT TYPE AND RH: 5100
UNIT TYPE AND RH: 5100
UNIT TYPE AND RH: 5100
UNIT TYPE AND RH: 5100
UNIT TYPE AND RH: 9500
Unit Type and Rh: 5100
Unit Type and Rh: 5100
Unit Type and Rh: 5100
Unit Type and Rh: 5100
Unit Type and Rh: 5100
Unit Type and Rh: 5100
Unit Type and Rh: 5100
Unit Type and Rh: 5100
Unit Type and Rh: 5100
Unit Type and Rh: 5100
Unit Type and Rh: 5100
Unit Type and Rh: 5100
Unit Type and Rh: 5100
Unit Type and Rh: 5100
Unit Type and Rh: 5100
Unit Type and Rh: 5100
Unit Type and Rh: 5100
Unit Type and Rh: 5100
Unit Type and Rh: 5100
Unit Type and Rh: 5100
Unit Type and Rh: 5100
Unit Type and Rh: 5100
Unit Type and Rh: 5100
Unit Type and Rh: 5100
Unit Type and Rh: 5100
Unit Type and Rh: 5100
Unit Type and Rh: 5100
Unit Type and Rh: 9500
Unit Type and Rh: 9500
Unit Type and Rh: 9500

## 2018-05-03 LAB — BPAM FFP
BLOOD PRODUCT EXPIRATION DATE: 202001012359
BLOOD PRODUCT EXPIRATION DATE: 202001022359
BLOOD PRODUCT EXPIRATION DATE: 202001022359
Blood Product Expiration Date: 202001012359
Blood Product Expiration Date: 202001022359
Blood Product Expiration Date: 202001022359
Blood Product Expiration Date: 202001022359
Blood Product Expiration Date: 202001022359
Blood Product Expiration Date: 202001022359
Blood Product Expiration Date: 202001022359
Blood Product Expiration Date: 202001022359
Blood Product Expiration Date: 202001022359
Blood Product Expiration Date: 202001022359
Blood Product Expiration Date: 202001022359
Blood Product Expiration Date: 202001022359
Blood Product Expiration Date: 202001022359
Blood Product Expiration Date: 202001022359
Blood Product Expiration Date: 202001022359
Blood Product Expiration Date: 202001022359
Blood Product Expiration Date: 202001022359
Blood Product Expiration Date: 202001022359
Blood Product Expiration Date: 202001022359
Blood Product Expiration Date: 202001022359
Blood Product Expiration Date: 202001022359
Blood Product Expiration Date: 202001022359
Blood Product Expiration Date: 202001092359
Blood Product Expiration Date: 202001102359
Blood Product Expiration Date: 202001102359
Blood Product Expiration Date: 202001172359
Blood Product Expiration Date: 202001182359
Blood Product Expiration Date: 202001182359
Blood Product Expiration Date: 202001192359
ISSUE DATE / TIME: 201912281836
ISSUE DATE / TIME: 201912281836
ISSUE DATE / TIME: 201912281909
ISSUE DATE / TIME: 201912281909
ISSUE DATE / TIME: 201912281913
ISSUE DATE / TIME: 201912281920
ISSUE DATE / TIME: 201912281920
ISSUE DATE / TIME: 201912281958
ISSUE DATE / TIME: 201912281958
ISSUE DATE / TIME: 201912281958
ISSUE DATE / TIME: 201912282003
ISSUE DATE / TIME: 201912282039
ISSUE DATE / TIME: 201912282039
ISSUE DATE / TIME: 201912282039
ISSUE DATE / TIME: 201912282039
ISSUE DATE / TIME: 201912282102
ISSUE DATE / TIME: 201912282113
ISSUE DATE / TIME: 201912282113
ISSUE DATE / TIME: 201912291512
ISSUE DATE / TIME: 201912291535
ISSUE DATE / TIME: 201912291541
ISSUE DATE / TIME: 201912291541
ISSUE DATE / TIME: 201912291601
ISSUE DATE / TIME: 201912291601
ISSUE DATE / TIME: 201912291601
ISSUE DATE / TIME: 201912302039
ISSUE DATE / TIME: 201912302039
ISSUE DATE / TIME: 201912302307
ISSUE DATE / TIME: 201912281913
ISSUE DATE / TIME: 201912282102
ISSUE DATE / TIME: 201912291601
ISSUE DATE / TIME: 201912302038
UNIT TYPE AND RH: 600
UNIT TYPE AND RH: 9500
Unit Type and Rh: 5100
Unit Type and Rh: 5100
Unit Type and Rh: 5100
Unit Type and Rh: 5100
Unit Type and Rh: 5100
Unit Type and Rh: 5100
Unit Type and Rh: 5100
Unit Type and Rh: 5100
Unit Type and Rh: 5100
Unit Type and Rh: 5100
Unit Type and Rh: 5100
Unit Type and Rh: 5100
Unit Type and Rh: 5100
Unit Type and Rh: 600
Unit Type and Rh: 600
Unit Type and Rh: 600
Unit Type and Rh: 600
Unit Type and Rh: 6200
Unit Type and Rh: 6200
Unit Type and Rh: 6200
Unit Type and Rh: 6200
Unit Type and Rh: 6200
Unit Type and Rh: 6200
Unit Type and Rh: 6200
Unit Type and Rh: 6200
Unit Type and Rh: 6200
Unit Type and Rh: 6200
Unit Type and Rh: 6200
Unit Type and Rh: 9500
Unit Type and Rh: 9500

## 2018-05-03 LAB — BLOOD GAS, ARTERIAL
Acid-base deficit: 16.4 mmol/L — ABNORMAL HIGH (ref 0.0–2.0)
Bicarbonate: 11 mmol/L — ABNORMAL LOW (ref 20.0–28.0)
FIO2: 100
MECHVT: 500 mL
O2 Saturation: 99 %
PEEP: 5 cmH2O
Patient temperature: 98.6
RATE: 30 resp/min
pCO2 arterial: 34.3 mmHg (ref 32.0–48.0)
pH, Arterial: 7.131 — CL (ref 7.350–7.450)
pO2, Arterial: 288 mmHg — ABNORMAL HIGH (ref 83.0–108.0)

## 2018-05-03 LAB — BASIC METABOLIC PANEL
Anion gap: 19 — ABNORMAL HIGH (ref 5–15)
Anion gap: 17 — ABNORMAL HIGH (ref 5–15)
Anion gap: 24 — ABNORMAL HIGH (ref 5–15)
BUN: 35 mg/dL — ABNORMAL HIGH (ref 6–20)
BUN: 37 mg/dL — ABNORMAL HIGH (ref 6–20)
BUN: 32 mg/dL — ABNORMAL HIGH (ref 6–20)
CO2: 10 mmol/L — ABNORMAL LOW (ref 22–32)
CO2: 13 mmol/L — ABNORMAL LOW (ref 22–32)
CO2: 11 mmol/L — ABNORMAL LOW (ref 22–32)
Calcium: 5.5 mg/dL — CL (ref 8.9–10.3)
Calcium: 5.5 mg/dL — CL (ref 8.9–10.3)
Calcium: 6.6 mg/dL — ABNORMAL LOW (ref 8.9–10.3)
Chloride: 112 mmol/L — ABNORMAL HIGH (ref 98–111)
Chloride: 109 mmol/L (ref 98–111)
Chloride: 104 mmol/L (ref 98–111)
Creatinine, Ser: 5.97 mg/dL — ABNORMAL HIGH (ref 0.61–1.24)
Creatinine, Ser: 5.93 mg/dL — ABNORMAL HIGH (ref 0.61–1.24)
Creatinine, Ser: 5.37 mg/dL — ABNORMAL HIGH (ref 0.61–1.24)
GFR calc Af Amer: 12 mL/min — ABNORMAL LOW (ref >60)
GFR calc Af Amer: 12 mL/min — ABNORMAL LOW (ref >60)
GFR calc Af Amer: 13 mL/min — ABNORMAL LOW (ref >60)
GFR calc non Af Amer: 11 mL/min — ABNORMAL LOW (ref >60)
GFR, EST NON AFRICAN AMERICAN: 10 mL/min — AB (ref >60)
GFR, EST NON AFRICAN AMERICAN: 10 mL/min — AB (ref >60)
Glucose, Bld: 134 mg/dL — ABNORMAL HIGH (ref 70–99)
Glucose, Bld: 146 mg/dL — ABNORMAL HIGH (ref 70–99)
Glucose, Bld: 140 mg/dL — ABNORMAL HIGH (ref 70–99)
Potassium: 6.7 mmol/L (ref 3.5–5.1)
Potassium: 6.2 mmol/L — ABNORMAL HIGH (ref 3.5–5.1)
Potassium: 6.2 mmol/L — ABNORMAL HIGH (ref 3.5–5.1)
SODIUM: 141 mmol/L (ref 135–145)
Sodium: 139 mmol/L (ref 135–145)
Sodium: 139 mmol/L (ref 135–145)

## 2018-05-03 LAB — POCT I-STAT 3, ART BLOOD GAS (G3+)
Acid-base deficit: 15 mmol/L — ABNORMAL HIGH (ref 0.0–2.0)
Acid-base deficit: 14 mmol/L — ABNORMAL HIGH (ref 0.0–2.0)
Bicarbonate: 12.6 mmol/L — ABNORMAL LOW (ref 20.0–28.0)
Bicarbonate: 13.5 mmol/L — ABNORMAL LOW (ref 20.0–28.0)
O2 Saturation: 100 %
O2 Saturation: 100 %
Patient temperature: 38.2
TCO2: 14 mmol/L — ABNORMAL LOW (ref 22–32)
TCO2: 15 mmol/L — ABNORMAL LOW (ref 22–32)
pCO2 arterial: 35.3 mmHg (ref 32.0–48.0)
pCO2 arterial: 35.6 mmHg (ref 32.0–48.0)
pH, Arterial: 7.168 — CL (ref 7.350–7.450)
pH, Arterial: 7.186 — CL (ref 7.350–7.450)
pO2, Arterial: 282 mmHg — ABNORMAL HIGH (ref 83.0–108.0)
pO2, Arterial: 207 mmHg — ABNORMAL HIGH (ref 83.0–108.0)

## 2018-05-03 LAB — CBC
HCT: 34.5 % — ABNORMAL LOW (ref 39.0–52.0)
Hemoglobin: 12 g/dL — ABNORMAL LOW (ref 13.0–17.0)
MCH: 31.2 pg (ref 26.0–34.0)
MCHC: 34.8 g/dL (ref 30.0–36.0)
MCV: 89.6 fL (ref 80.0–100.0)
PLATELETS: 83 10*3/uL — AB (ref 150–400)
RBC: 3.85 MIL/uL — ABNORMAL LOW (ref 4.22–5.81)
RDW: 15.2 % (ref 11.5–15.5)
WBC: 11.5 10*3/uL — ABNORMAL HIGH (ref 4.0–10.5)
nRBC: 3.1 % — ABNORMAL HIGH (ref 0.0–0.2)

## 2018-05-03 LAB — RENAL FUNCTION PANEL
ANION GAP: 22 — AB (ref 5–15)
Albumin: 2.1 g/dL — ABNORMAL LOW (ref 3.5–5.0)
BUN: 35 mg/dL — ABNORMAL HIGH (ref 6–20)
CALCIUM: 6.5 mg/dL — AB (ref 8.9–10.3)
CO2: 12 mmol/L — ABNORMAL LOW (ref 22–32)
Chloride: 106 mmol/L (ref 98–111)
Creatinine, Ser: 5.74 mg/dL — ABNORMAL HIGH (ref 0.61–1.24)
GFR calc Af Amer: 12 mL/min — ABNORMAL LOW (ref >60)
GFR, EST NON AFRICAN AMERICAN: 10 mL/min — AB (ref >60)
Glucose, Bld: 140 mg/dL — ABNORMAL HIGH (ref 70–99)
Phosphorus: 10.3 mg/dL — ABNORMAL HIGH (ref 2.5–4.6)
Potassium: 6 mmol/L — ABNORMAL HIGH (ref 3.5–5.1)
Sodium: 140 mmol/L (ref 135–145)

## 2018-05-03 LAB — GLUCOSE, CAPILLARY
Glucose-Capillary: 122 mg/dL — ABNORMAL HIGH (ref 70–99)
Glucose-Capillary: 126 mg/dL — ABNORMAL HIGH (ref 70–99)
Glucose-Capillary: 131 mg/dL — ABNORMAL HIGH (ref 70–99)
Glucose-Capillary: 133 mg/dL — ABNORMAL HIGH (ref 70–99)
Glucose-Capillary: 139 mg/dL — ABNORMAL HIGH (ref 70–99)
Glucose-Capillary: 96 mg/dL (ref 70–99)

## 2018-05-03 LAB — CK

## 2018-05-03 MED ORDER — PRISMASOL BGK 4/2.5 32-4-2.5 MEQ/L REPLACEMENT SOLN
Status: DC
  Administered 2018-05-03 – 2018-05-13 (×21): via INTRAVENOUS_CENTRAL
  Filled 2018-05-03 (×26): qty 5000

## 2018-05-03 MED ORDER — SODIUM BICARBONATE 8.4 % IV SOLN
INTRAVENOUS | Status: DC
  Administered 2018-05-03: 05:00:00 via INTRAVENOUS
  Filled 2018-05-03 (×2): qty 150

## 2018-05-03 MED ORDER — CALCIUM GLUCONATE-NACL 1-0.675 GM/50ML-% IV SOLN
1.0000 g | Freq: Once | INTRAVENOUS | Status: AC
  Administered 2018-05-03: 1000 mg via INTRAVENOUS
  Filled 2018-05-03: qty 50

## 2018-05-03 MED ORDER — SODIUM BICARBONATE 8.4 % IV SOLN
INTRAVENOUS | Status: AC
  Administered 2018-05-03: 100 meq via INTRAVENOUS
  Filled 2018-05-03: qty 100

## 2018-05-03 MED ORDER — SODIUM BICARBONATE 8.4 % IV SOLN
100.0000 meq | Freq: Once | INTRAVENOUS | Status: AC
  Administered 2018-05-04: 100 meq via INTRAVENOUS
  Filled 2018-05-03: qty 100

## 2018-05-03 MED ORDER — SODIUM CHLORIDE 0.9 % IV SOLN
2.0000 g | INTRAVENOUS | Status: AC
  Administered 2018-05-03: 2 g via INTRAVENOUS
  Filled 2018-05-03: qty 20

## 2018-05-03 MED ORDER — DEXTROSE 50 % IV SOLN
1.0000 | Freq: Once | INTRAVENOUS | Status: AC
  Administered 2018-05-03: 50 mL via INTRAVENOUS
  Filled 2018-05-03: qty 50

## 2018-05-03 MED ORDER — PRISMASOL BGK 0/2.5 32-2.5 MEQ/L IV SOLN
INTRAVENOUS | Status: DC
  Administered 2018-05-03 – 2018-05-04 (×8): via INTRAVENOUS_CENTRAL
  Filled 2018-05-03 (×14): qty 5000

## 2018-05-03 MED ORDER — SODIUM BICARBONATE 8.4 % IV SOLN
100.0000 meq | Freq: Once | INTRAVENOUS | Status: AC
  Administered 2018-05-03: 100 meq via INTRAVENOUS

## 2018-05-03 MED ORDER — PIPERACILLIN-TAZOBACTAM IN DEX 2-0.25 GM/50ML IV SOLN
2.2500 g | Freq: Four times a day (QID) | INTRAVENOUS | Status: DC
  Administered 2018-05-03 – 2018-05-08 (×20): 2.25 g via INTRAVENOUS
  Filled 2018-05-03 (×20): qty 50

## 2018-05-03 MED ORDER — SODIUM CHLORIDE 0.9 % IV SOLN
0.0000 ug/min | INTRAVENOUS | Status: DC
  Administered 2018-05-03: 300 ug/min via INTRAVENOUS
  Administered 2018-05-03: 380 ug/min via INTRAVENOUS
  Administered 2018-05-03: 360 ug/min via INTRAVENOUS
  Administered 2018-05-03: 300 ug/min via INTRAVENOUS
  Administered 2018-05-03 – 2018-05-04 (×6): 400 ug/min via INTRAVENOUS
  Administered 2018-05-05: 125 ug/min via INTRAVENOUS
  Filled 2018-05-03 (×2): qty 8
  Filled 2018-05-03 (×2): qty 6
  Filled 2018-05-03 (×2): qty 8
  Filled 2018-05-03: qty 6
  Filled 2018-05-03 (×2): qty 8
  Filled 2018-05-03: qty 6
  Filled 2018-05-03 (×3): qty 8

## 2018-05-03 MED ORDER — INSULIN ASPART 100 UNIT/ML IV SOLN
10.0000 [IU] | Freq: Once | INTRAVENOUS | Status: AC
  Administered 2018-05-03: 10 [IU] via INTRAVENOUS

## 2018-05-03 MED ORDER — SODIUM CHLORIDE 0.9 % IV SOLN: INTRAVENOUS | Status: DC | PRN

## 2018-05-03 MED ORDER — STERILE WATER FOR INJECTION IV SOLN
INTRAVENOUS | Status: DC
  Administered 2018-05-04 – 2018-05-06 (×6): via INTRAVENOUS
  Filled 2018-05-03 (×8): qty 850

## 2018-05-03 MED ORDER — PRISMASOL BGK 4/2.5 32-4-2.5 MEQ/L IV SOLN
INTRAVENOUS | Status: DC
  Administered 2018-05-03 (×4): via INTRAVENOUS_CENTRAL

## 2018-05-03 MED ORDER — PRISMASOL BGK 4/2.5 32-4-2.5 MEQ/L REPLACEMENT SOLN
Status: DC
  Administered 2018-05-03 – 2018-05-12 (×22): via INTRAVENOUS_CENTRAL
  Filled 2018-05-03 (×26): qty 5000

## 2018-05-03 MED ORDER — VASOPRESSIN 20 UNIT/ML IV SOLN
0.0100 [IU]/min | INTRAVENOUS | Status: DC
  Administered 2018-05-03 – 2018-05-06 (×4): 0.03 [IU]/min via INTRAVENOUS
  Filled 2018-05-03 (×4): qty 2

## 2018-05-03 MED ORDER — HEPARIN SODIUM (PORCINE) 1000 UNIT/ML DIALYSIS
1000.0000 [IU] | INTRAMUSCULAR | Status: DC | PRN
  Administered 2018-05-06: 3000 [IU] via INTRAVENOUS_CENTRAL
  Administered 2018-05-12: 2200 [IU] via INTRAVENOUS_CENTRAL
  Filled 2018-05-03 (×4): qty 6

## 2018-05-03 NOTE — Progress Notes (Signed)
Critical Lab values called to trauma MD. MD to enter orders.  

## 2018-05-03 NOTE — Progress Notes (Signed)
PHARMACY NOTE:  ANTIMICROBIAL RENAL DOSAGE ADJUSTMENT  Current antimicrobial regimen includes a mismatch between antimicrobial dosage and estimated renal function.  As per policy approved by the Pharmacy & Therapeutics and Medical Executive Committees, the antimicrobial dosage will be adjusted accordingly.  Current antimicrobial dosage:  Zosyn 3.375g IV Q8H (4-hr infusion)  Indication: open packed abdomen  Renal Function:  Estimated Creatinine Clearance: 22 mL/min (A) (by C-G formula based on SCr of 5.97 mg/dL (H)). []      On intermittent HD, scheduled: [x]      On CRRT    Antimicrobial dosage has been changed to:  Zosyn 2.25g IV Q6H (30-min infusion)    Thank you for allowing pharmacy to be a part of this patient's care.  Vernard GamblesVeronda Yatzary Merriweather, PharmD, BCPS  05/03/2018 6:41 AM

## 2018-05-03 NOTE — Progress Notes (Signed)
Trauma MD made aware of blood gas results and continued lack of urine. MD to enter orders/consults.
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
## 2018-05-03 NOTE — Progress Notes (Signed)
eLink Physician-Brief Progress Note Patient Name: Armando GangBengy C Kluger DOB: [birth year] MRN: [id number]   Date of Service  05/03/2018  HPI/Events of Note  Ongoing metabolic acidosis with pH 7.16/35/282/12.6.  Max vent setting to facilitate acid/base correction.  eICU Interventions  Plan: 2 amps of bicarb IVP Place on Bicarb gtt at 100 cc/hr Recheck ABG in 4 hours.     Intervention Category Major Interventions: Acid-Base disturbance - evaluation and management  Tiaira Arambula 05/03/2018, 4:40 AM

## 2018-05-03 NOTE — Progress Notes (Signed)
Trauma MD made aware of increasing pressor requirements and pt unresponsive, sedation level decreasing.

## 2018-05-03 NOTE — Procedures (Signed)
Central Venous Catheter Insertion Procedure Note Armando GangBengy C Bardwell [id number] [birth year]  Procedure: Insertion of Central Venous Catheter Indications: hemodialysis  Procedure Details Consent: Unable to obtain consent because of emergent medical necessity. Time Out: Verified patient identification, verified procedure, site/side was marked, verified correct patient position, special equipment/implants available, medications/allergies/relevent history reviewed, required imaging and test results available.  Performed  Maximum sterile technique was used including antiseptics, cap, gloves, gown, hand hygiene, mask and sheet. Skin prep: Chlorhexidine; local anesthetic administered A antimicrobial bonded/coated triple lumen catheter was placed in the right subclavian vein using the Seldinger technique. 74F 15cm Power Trialysis catheter inserted under ultrasound guidance. Single pass.      Evaluation Blood flow good Complications: No apparent complications Patient did tolerate procedure well. Chest X-ray ordered to verify placement.  CXR: normal.  Devere Brem 05/03/2018, 10:38 AM
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
## 2018-05-03 NOTE — Progress Notes (Signed)
eLink Physician-Brief Progress Note Patient Name: Bruce Henson DOB: 1965/06/18 MRN: [id number]   Date of Service  05/03/2018  HPI/Events of Note  ABG on 60%/PRVC 30/TV 500/P 8 = 7.131/34.3/288  eICU Interventions  Will order: 1. NaHCO3 100 meq IV now.  2. NaHCO3 IV infusion to run at 75 mL/hour.  3. ABG at 3 AM.      Intervention Category Major Interventions: Acid-Base disturbance - evaluation and management;Respiratory failure - evaluation and management  Lenell AntuSommer,Steven Eugene 05/03/2018, 11:53 PM

## 2018-05-04 ENCOUNTER — Inpatient Hospital Stay (HOSPITAL_COMMUNITY): Payer: 59

## 2018-05-04 ENCOUNTER — Encounter (HOSPITAL_COMMUNITY): Payer: Self-pay | Admitting: *Deleted

## 2018-05-04 DIAGNOSIS — J9601 Acute respiratory failure with hypoxia: Secondary | ICD-10-CM

## 2018-05-04 DIAGNOSIS — Z9911 Dependence on respirator [ventilator] status: Secondary | ICD-10-CM

## 2018-05-04 DIAGNOSIS — T1490XA Injury, unspecified, initial encounter: Secondary | ICD-10-CM

## 2018-05-04 DIAGNOSIS — J80 Acute respiratory distress syndrome: Secondary | ICD-10-CM

## 2018-05-04 DIAGNOSIS — E8779 Other fluid overload: Secondary | ICD-10-CM

## 2018-05-04 DIAGNOSIS — R579 Shock, unspecified: Secondary | ICD-10-CM

## 2018-05-04 LAB — BASIC METABOLIC PANEL
Anion gap: 23 — ABNORMAL HIGH (ref 5–15)
BUN: 31 mg/dL — ABNORMAL HIGH (ref 6–20)
CO2: 11 mmol/L — AB (ref 22–32)
Calcium: 6.6 mg/dL — ABNORMAL LOW (ref 8.9–10.3)
Chloride: 105 mmol/L (ref 98–111)
Creatinine, Ser: 5.13 mg/dL — ABNORMAL HIGH (ref 0.61–1.24)
GFR calc Af Amer: 14 mL/min — ABNORMAL LOW (ref >60)
GFR calc non Af Amer: 12 mL/min — ABNORMAL LOW (ref >60)
Glucose, Bld: 143 mg/dL — ABNORMAL HIGH (ref 70–99)
Potassium: 5.9 mmol/L — ABNORMAL HIGH (ref 3.5–5.1)
Sodium: 139 mmol/L (ref 135–145)

## 2018-05-04 LAB — GLUCOSE, CAPILLARY
Glucose-Capillary: 126 mg/dL — ABNORMAL HIGH (ref 70–99)
Glucose-Capillary: 148 mg/dL — ABNORMAL HIGH (ref 70–99)
Glucose-Capillary: 117 mg/dL — ABNORMAL HIGH (ref 70–99)
Glucose-Capillary: 144 mg/dL — ABNORMAL HIGH (ref 70–99)

## 2018-05-04 LAB — RENAL FUNCTION PANEL
Albumin: 1.9 g/dL — ABNORMAL LOW (ref 3.5–5.0)
Albumin: 2 g/dL — ABNORMAL LOW (ref 3.5–5.0)
Anion gap: 22 — ABNORMAL HIGH (ref 5–15)
Anion gap: 21 — ABNORMAL HIGH (ref 5–15)
BUN: 33 mg/dL — ABNORMAL HIGH (ref 6–20)
BUN: 35 mg/dL — ABNORMAL HIGH (ref 6–20)
CHLORIDE: 104 mmol/L (ref 98–111)
CO2: 13 mmol/L — AB (ref 22–32)
CO2: 12 mmol/L — ABNORMAL LOW (ref 22–32)
Calcium: 6.3 mg/dL — CL (ref 8.9–10.3)
Calcium: 6.5 mg/dL — ABNORMAL LOW (ref 8.9–10.3)
Chloride: 105 mmol/L (ref 98–111)
Creatinine, Ser: 5.21 mg/dL — ABNORMAL HIGH (ref 0.61–1.24)
Creatinine, Ser: 4.57 mg/dL — ABNORMAL HIGH (ref 0.61–1.24)
GFR calc Af Amer: 14 mL/min — ABNORMAL LOW (ref >60)
GFR calc non Af Amer: 12 mL/min — ABNORMAL LOW (ref >60)
GFR calc non Af Amer: 14 mL/min — ABNORMAL LOW (ref >60)
GFR, EST AFRICAN AMERICAN: 16 mL/min — AB (ref >60)
Glucose, Bld: 141 mg/dL — ABNORMAL HIGH (ref 70–99)
Glucose, Bld: 144 mg/dL — ABNORMAL HIGH (ref 70–99)
Phosphorus: 9.5 mg/dL — ABNORMAL HIGH (ref 2.5–4.6)
Phosphorus: 7.5 mg/dL — ABNORMAL HIGH (ref 2.5–4.6)
Potassium: 5.5 mmol/L — ABNORMAL HIGH (ref 3.5–5.1)
Potassium: 4.8 mmol/L (ref 3.5–5.1)
Sodium: 139 mmol/L (ref 135–145)
Sodium: 138 mmol/L (ref 135–145)

## 2018-05-04 LAB — POCT I-STAT 3, ART BLOOD GAS (G3+)
Acid-base deficit: 5 mmol/L — ABNORMAL HIGH (ref 0.0–2.0)
Bicarbonate: 19.1 mmol/L — ABNORMAL LOW (ref 20.0–28.0)
O2 Saturation: 99 %
Patient temperature: 98.6
TCO2: 20 mmol/L — ABNORMAL LOW (ref 22–32)
pCO2 arterial: 32.8 mmHg (ref 32.0–48.0)
pH, Arterial: 7.374 (ref 7.350–7.450)
pO2, Arterial: 162 mmHg — ABNORMAL HIGH (ref 83.0–108.0)

## 2018-05-04 LAB — BLOOD GAS, ARTERIAL
Acid-base deficit: 15.2 mmol/L — ABNORMAL HIGH (ref 0.0–2.0)
Bicarbonate: 11.5 mmol/L — ABNORMAL LOW (ref 20.0–28.0)
FIO2: 60
LHR: 30 {breaths}/min
MECHVT: 500 mL
O2 SAT: 98.8 %
PEEP: 5 cmH2O
Patient temperature: 98.6
pCO2 arterial: 32.1 mmHg (ref 32.0–48.0)
pH, Arterial: 7.181 — CL (ref 7.350–7.450)
pO2, Arterial: 181 mmHg — ABNORMAL HIGH (ref 83.0–108.0)

## 2018-05-04 LAB — CBC
HCT: 33.6 % — ABNORMAL LOW (ref 39.0–52.0)
Hemoglobin: 11.1 g/dL — ABNORMAL LOW (ref 13.0–17.0)
MCH: 30.5 pg (ref 26.0–34.0)
MCHC: 33 g/dL (ref 30.0–36.0)
MCV: 92.3 fL (ref 80.0–100.0)
Platelets: 82 10*3/uL — ABNORMAL LOW (ref 150–400)
RBC: 3.64 MIL/uL — ABNORMAL LOW (ref 4.22–5.81)
RDW: 15.6 % — AB (ref 11.5–15.5)
WBC: 13.9 10*3/uL — ABNORMAL HIGH (ref 4.0–10.5)
nRBC: 10.5 % — ABNORMAL HIGH (ref 0.0–0.2)

## 2018-05-04 LAB — ECHOCARDIOGRAM COMPLETE
HEIGHTINCHES: 74 in
WEIGHTICAEL: 5146.42 [oz_av]

## 2018-05-04 LAB — SURGICAL PCR SCREEN
MRSA, PCR: NEGATIVE
Staphylococcus aureus: NEGATIVE

## 2018-05-04 LAB — MAGNESIUM: Magnesium: 2.3 mg/dL (ref 1.7–2.4)

## 2018-05-04 LAB — TRIGLYCERIDES: Triglycerides: 1674 mg/dL — ABNORMAL HIGH (ref <150)

## 2018-05-04 MED ORDER — PRISMASOL BGK 4/2.5 32-4-2.5 MEQ/L IV SOLN
INTRAVENOUS | Status: DC
  Administered 2018-05-04 – 2018-05-07 (×20): via INTRAVENOUS_CENTRAL
  Administered 2018-05-07: 5000 mL via INTRAVENOUS_CENTRAL
  Administered 2018-05-07 – 2018-05-12 (×46): via INTRAVENOUS_CENTRAL
  Filled 2018-05-04 (×36): qty 5000

## 2018-05-04 MED ORDER — WHITE PETROLATUM EX OINT
TOPICAL_OINTMENT | CUTANEOUS | Status: AC
  Administered 2018-05-04: 1
  Filled 2018-05-04: qty 28.35

## 2018-05-04 MED ORDER — NOREPINEPHRINE 16 MG/250ML-% IV SOLN
0.0000 ug/min | INTRAVENOUS | Status: DC
  Administered 2018-05-04: 20 ug/min via INTRAVENOUS
  Administered 2018-05-05: 10 ug/min via INTRAVENOUS
  Administered 2018-05-06: 5 ug/min via INTRAVENOUS
  Administered 2018-05-08: 2 ug/min via INTRAVENOUS
  Administered 2018-05-10: 10 ug/min via INTRAVENOUS
  Administered 2018-05-13 – 2018-05-14 (×7): 40 ug/min via INTRAVENOUS
  Filled 2018-05-04 (×13): qty 250

## 2018-05-04 MED ORDER — PERFLUTREN LIPID MICROSPHERE
1.0000 mL | INTRAVENOUS | Status: AC | PRN
  Administered 2018-05-04: 2 mL via INTRAVENOUS
  Filled 2018-05-04: qty 10

## 2018-05-04 MED ORDER — CALCIUM GLUCONATE-NACL 1-0.675 GM/50ML-% IV SOLN
1.0000 g | Freq: Once | INTRAVENOUS | Status: AC
  Administered 2018-05-04: 1000 mg via INTRAVENOUS
  Filled 2018-05-04: qty 50

## 2018-05-04 NOTE — Plan of Care (Signed)
Pt admitted 12/28 and not yet receiving enteral nutrition. MD made aware, does not wish to advance today.
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
## 2018-05-04 NOTE — Progress Notes (Signed)
Echocardiogram 2D Echocardiogram has been performed.  Bruce Henson 05/04/2018, 10:16 AM

## 2018-05-04 NOTE — Progress Notes (Signed)
Chaplain came to follow-up. Pt's wife bedside. Wife felt that the request for more information had been provided.   Surgery for today has been postponed.  Will continue to be available. Lynnell ChadVirginia Olene Godfrey Pager [phone]

## 2018-05-04 NOTE — Progress Notes (Signed)
eLink Physician-Brief Progress Note Patient Name: [name] DOB: [birth year] MRN: [id number]   Date of Service  05/04/2018  HPI/Events of Note  Last ABG with pH = 7.181. No follow up ABG ordered by day teams.  eICU Interventions  Will order: 1. ABG STAT.     Intervention Category Major Interventions: Acid-Base disturbance - evaluation and management;Respiratory failure - evaluation and management  Lenell AntuSommer,[name] Eugene 05/04/2018, 7:46 PM
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
## 2018-05-04 DEATH — deceased

## 2018-05-05 ENCOUNTER — Telehealth (HOSPITAL_COMMUNITY): Payer: Self-pay

## 2018-05-05 ENCOUNTER — Encounter (HOSPITAL_COMMUNITY): Admission: EM | Disposition: E | Payer: Self-pay | Source: Home / Self Care

## 2018-05-05 ENCOUNTER — Inpatient Hospital Stay (HOSPITAL_COMMUNITY): Payer: 59

## 2018-05-05 ENCOUNTER — Encounter (HOSPITAL_COMMUNITY): Payer: Self-pay | Admitting: Student

## 2018-05-05 DIAGNOSIS — J9601 Acute respiratory failure with hypoxia: Secondary | ICD-10-CM

## 2018-05-05 LAB — RENAL FUNCTION PANEL
Albumin: 1.8 g/dL — ABNORMAL LOW (ref 3.5–5.0)
Albumin: 1.8 g/dL — ABNORMAL LOW (ref 3.5–5.0)
Anion gap: 14 (ref 5–15)
Anion gap: 15 (ref 5–15)
BUN: 30 mg/dL — ABNORMAL HIGH (ref 6–20)
BUN: 31 mg/dL — ABNORMAL HIGH (ref 6–20)
CHLORIDE: 101 mmol/L (ref 98–111)
CO2: 20 mmol/L — AB (ref 22–32)
CO2: 20 mmol/L — ABNORMAL LOW (ref 22–32)
Calcium: 6.6 mg/dL — ABNORMAL LOW (ref 8.9–10.3)
Calcium: 6.7 mg/dL — ABNORMAL LOW (ref 8.9–10.3)
Chloride: 102 mmol/L (ref 98–111)
Creatinine, Ser: 3.9 mg/dL — ABNORMAL HIGH (ref 0.61–1.24)
Creatinine, Ser: 3.67 mg/dL — ABNORMAL HIGH (ref 0.61–1.24)
GFR calc Af Amer: 19 mL/min — ABNORMAL LOW (ref >60)
GFR calc Af Amer: 21 mL/min — ABNORMAL LOW (ref >60)
GFR calc non Af Amer: 17 mL/min — ABNORMAL LOW (ref >60)
GFR calc non Af Amer: 18 mL/min — ABNORMAL LOW (ref >60)
GLUCOSE: 151 mg/dL — AB (ref 70–99)
GLUCOSE: 147 mg/dL — AB (ref 70–99)
Phosphorus: 5.5 mg/dL — ABNORMAL HIGH (ref 2.5–4.6)
Phosphorus: 4.8 mg/dL — ABNORMAL HIGH (ref 2.5–4.6)
Potassium: 4.4 mmol/L (ref 3.5–5.1)
Potassium: 4.2 mmol/L (ref 3.5–5.1)
Sodium: 135 mmol/L (ref 135–145)
Sodium: 137 mmol/L (ref 135–145)

## 2018-05-05 LAB — CBC
HCT: 30.5 % — ABNORMAL LOW (ref 39.0–52.0)
Hemoglobin: 10.3 g/dL — ABNORMAL LOW (ref 13.0–17.0)
MCH: 29.5 pg (ref 26.0–34.0)
MCHC: 33.8 g/dL (ref 30.0–36.0)
MCV: 87.4 fL (ref 80.0–100.0)
Platelets: 58 10*3/uL — ABNORMAL LOW (ref 150–400)
RBC: 3.49 MIL/uL — ABNORMAL LOW (ref 4.22–5.81)
RDW: 15.4 % (ref 11.5–15.5)
WBC: 10.8 10*3/uL — ABNORMAL HIGH (ref 4.0–10.5)
nRBC: 18.7 % — ABNORMAL HIGH (ref 0.0–0.2)

## 2018-05-05 LAB — GLUCOSE, CAPILLARY
Glucose-Capillary: 37 mg/dL — CL (ref 70–99)
Glucose-Capillary: 132 mg/dL — ABNORMAL HIGH (ref 70–99)
Glucose-Capillary: 133 mg/dL — ABNORMAL HIGH (ref 70–99)
Glucose-Capillary: 132 mg/dL — ABNORMAL HIGH (ref 70–99)
Glucose-Capillary: 126 mg/dL — ABNORMAL HIGH (ref 70–99)

## 2018-05-05 LAB — TYPE AND SCREEN
ABO/RH(D): O POS
ANTIBODY SCREEN: NEGATIVE

## 2018-05-05 LAB — MAGNESIUM: Magnesium: 2.2 mg/dL (ref 1.7–2.4)

## 2018-05-05 LAB — TRIGLYCERIDES: Triglycerides: 1246 mg/dL — ABNORMAL HIGH (ref <150)

## 2018-05-05 SURGERY — INSERTION, INTRAMEDULLARY ROD, FEMUR, RETROGRADE
Anesthesia: General | Laterality: Right

## 2018-05-05 MED ORDER — FENTANYL CITRATE (PF) 2500 MCG/50ML IJ SOLN
25.0000 ug/h | Status: DC
  Administered 2018-05-05 (×2): 300 ug/h via INTRAVENOUS
  Administered 2018-05-07: 275 ug/h via INTRAVENOUS
  Administered 2018-05-08: 250 ug/h via INTRAVENOUS
  Administered 2018-05-08: 350 ug/h via INTRAVENOUS
  Administered 2018-05-09 – 2018-05-10 (×2): 300 ug/h via INTRAVENOUS
  Administered 2018-05-11: 200 ug/h via INTRAVENOUS
  Administered 2018-05-12: 125 ug/h via INTRAVENOUS
  Administered 2018-05-14: 100 ug/h via INTRAVENOUS
  Filled 2018-05-05 (×12): qty 100

## 2018-05-05 NOTE — Telephone Encounter (Signed)
Hello,  A fax was received in our office from [facility] regarding FLMA paperwork for this patient. I am not sure where to send the paperwork for completion. Please advise.   Thanks, [employer]  [email or web address] [phone]
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
## 2018-05-05 NOTE — Progress Notes (Signed)
New order placed at 2145 by Dr. Glenna Fellows, Nephrology MD, to maintain patient's fluid removal rate to keep net total even. Previous order stated to remove 49mL per hour with CRRT. Called Dr. Malen Gauze, night Nephrology coverage, to confirm new order. Stated to follow the new order. RN will now maintain patient net even and not pull off any fluid and continue to monitor.
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
## 2018-05-05 NOTE — Progress Notes (Signed)
Chaplain stopped in twice today, missing patient's wife Dondra Spry both times.  Offering prayer for her and the patient.  Unit chaplain will be unavailable tomorrow, Jan. 3, daytime, coming in at 5 PM for all night shift. Will be available via all hospital beeper from 5 PM to 8:30 AM Saturday. Lynnell Chad Pager [phone] Unit beeper [phone] All hospital beeper (night -time)

## 2018-05-06 ENCOUNTER — Inpatient Hospital Stay (HOSPITAL_COMMUNITY): Payer: 59

## 2018-05-06 ENCOUNTER — Encounter (HOSPITAL_COMMUNITY): Admission: EM | Disposition: E | Payer: Self-pay | Source: Home / Self Care

## 2018-05-06 ENCOUNTER — Inpatient Hospital Stay (HOSPITAL_COMMUNITY): Payer: 59 | Admitting: Certified Registered Nurse Anesthetist

## 2018-05-06 DIAGNOSIS — N179 Acute kidney failure, unspecified: Secondary | ICD-10-CM

## 2018-05-06 HISTORY — PX: LAPAROTOMY: SHX154

## 2018-05-06 HISTORY — PX: WOUND DEBRIDEMENT: SHX247

## 2018-05-06 LAB — BLOOD GAS, ARTERIAL
Acid-base deficit: 1.6 mmol/L (ref 0.0–2.0)
Bicarbonate: 21.7 mmol/L (ref 20.0–28.0)
Drawn by: 313941
FIO2: 40
MECHVT: 500 mL
O2 Saturation: 99.1 %
PEEP/CPAP: 8 cmH2O
Patient temperature: 97.4
RATE: 30 resp/min
pCO2 arterial: 29.5 mmHg — ABNORMAL LOW (ref 32.0–48.0)
pH, Arterial: 7.476 — ABNORMAL HIGH (ref 7.350–7.450)
pO2, Arterial: 169 mmHg — ABNORMAL HIGH (ref 83.0–108.0)

## 2018-05-06 LAB — CBC
HCT: 29.4 % — ABNORMAL LOW (ref 39.0–52.0)
Hemoglobin: 10.1 g/dL — ABNORMAL LOW (ref 13.0–17.0)
MCH: 30 pg (ref 26.0–34.0)
MCHC: 34.4 g/dL (ref 30.0–36.0)
MCV: 87.2 fL (ref 80.0–100.0)
Platelets: 68 10*3/uL — ABNORMAL LOW (ref 150–400)
RBC: 3.37 MIL/uL — ABNORMAL LOW (ref 4.22–5.81)
RDW: 15.4 % (ref 11.5–15.5)
WBC: 11.6 10*3/uL — AB (ref 4.0–10.5)
nRBC: 15.5 % — ABNORMAL HIGH (ref 0.0–0.2)

## 2018-05-06 LAB — RENAL FUNCTION PANEL
Albumin: 1.8 g/dL — ABNORMAL LOW (ref 3.5–5.0)
Albumin: 1.8 g/dL — ABNORMAL LOW (ref 3.5–5.0)
Anion gap: 14 (ref 5–15)
Anion gap: 13 (ref 5–15)
BUN: 33 mg/dL — ABNORMAL HIGH (ref 6–20)
BUN: 33 mg/dL — ABNORMAL HIGH (ref 6–20)
CALCIUM: 7 mg/dL — AB (ref 8.9–10.3)
CO2: 22 mmol/L (ref 22–32)
CO2: 21 mmol/L — ABNORMAL LOW (ref 22–32)
Calcium: 7.1 mg/dL — ABNORMAL LOW (ref 8.9–10.3)
Chloride: 101 mmol/L (ref 98–111)
Chloride: 102 mmol/L (ref 98–111)
Creatinine, Ser: 3.49 mg/dL — ABNORMAL HIGH (ref 0.61–1.24)
Creatinine, Ser: 3.22 mg/dL — ABNORMAL HIGH (ref 0.61–1.24)
GFR calc Af Amer: 22 mL/min — ABNORMAL LOW (ref >60)
GFR calc Af Amer: 24 mL/min — ABNORMAL LOW (ref >60)
GFR calc non Af Amer: 19 mL/min — ABNORMAL LOW (ref >60)
GFR calc non Af Amer: 21 mL/min — ABNORMAL LOW (ref >60)
GLUCOSE: 147 mg/dL — AB (ref 70–99)
Glucose, Bld: 151 mg/dL — ABNORMAL HIGH (ref 70–99)
Phosphorus: 4.3 mg/dL (ref 2.5–4.6)
Phosphorus: 4.7 mg/dL — ABNORMAL HIGH (ref 2.5–4.6)
Potassium: 4.2 mmol/L (ref 3.5–5.1)
Potassium: 4.2 mmol/L (ref 3.5–5.1)
SODIUM: 137 mmol/L (ref 135–145)
Sodium: 136 mmol/L (ref 135–145)

## 2018-05-06 LAB — GLUCOSE, CAPILLARY
GLUCOSE-CAPILLARY: 114 mg/dL — AB (ref 70–99)
GLUCOSE-CAPILLARY: 138 mg/dL — AB (ref 70–99)
Glucose-Capillary: 77 mg/dL (ref 70–99)
Glucose-Capillary: 94 mg/dL (ref 70–99)
Glucose-Capillary: 130 mg/dL — ABNORMAL HIGH (ref 70–99)
Glucose-Capillary: 119 mg/dL — ABNORMAL HIGH (ref 70–99)
Glucose-Capillary: 136 mg/dL — ABNORMAL HIGH (ref 70–99)
Glucose-Capillary: 132 mg/dL — ABNORMAL HIGH (ref 70–99)
Glucose-Capillary: 69 mg/dL — ABNORMAL LOW (ref 70–99)
Glucose-Capillary: 161 mg/dL — ABNORMAL HIGH (ref 70–99)
Glucose-Capillary: 127 mg/dL — ABNORMAL HIGH (ref 70–99)

## 2018-05-06 LAB — MAGNESIUM: Magnesium: 2.6 mg/dL — ABNORMAL HIGH (ref 1.7–2.4)

## 2018-05-06 SURGERY — LAPAROTOMY, EXPLORATORY
Anesthesia: General | Site: Abdomen

## 2018-05-06 MED ORDER — DEXTROSE 50 % IV SOLN
INTRAVENOUS | Status: AC
  Administered 2018-05-06: 25 mL
  Filled 2018-05-06: qty 50

## 2018-05-06 MED ORDER — PHENYLEPHRINE 40 MCG/ML (10ML) SYRINGE FOR IV PUSH (FOR BLOOD PRESSURE SUPPORT)
PREFILLED_SYRINGE | INTRAVENOUS | Status: AC
  Filled 2018-05-06: qty 10

## 2018-05-06 MED ORDER — LACTATED RINGERS IV SOLN
INTRAVENOUS | Status: DC | PRN
  Administered 2018-05-06: 07:00:00 via INTRAVENOUS

## 2018-05-06 MED ORDER — FENTANYL CITRATE (PF) 250 MCG/5ML IJ SOLN
INTRAMUSCULAR | Status: AC
  Filled 2018-05-06: qty 5

## 2018-05-06 MED ORDER — PHENYLEPHRINE HCL 10 MG/ML IJ SOLN
INTRAMUSCULAR | Status: DC | PRN
  Administered 2018-05-06 (×2): 80 ug via INTRAVENOUS

## 2018-05-06 MED ORDER — 0.9 % SODIUM CHLORIDE (POUR BTL) OPTIME
TOPICAL | Status: DC | PRN
  Administered 2018-05-06: 1000 mL

## 2018-05-06 MED ORDER — PROPOFOL 500 MG/50ML IV EMUL
INTRAVENOUS | Status: DC | PRN
  Administered 2018-05-06: 25 ug/kg/min via INTRAVENOUS

## 2018-05-06 MED ORDER — CISATRACURIUM BESYLATE 20 MG/10ML IV SOLN
INTRAVENOUS | Status: AC
  Filled 2018-05-06: qty 10

## 2018-05-06 MED ORDER — CISATRACURIUM BESYLATE (PF) 10 MG/5ML IV SOLN
INTRAVENOUS | Status: DC | PRN
  Administered 2018-05-06: 10 mg via INTRAVENOUS

## 2018-05-06 MED ORDER — FENTANYL CITRATE (PF) 100 MCG/2ML IJ SOLN
INTRAMUSCULAR | Status: DC | PRN
  Administered 2018-05-06 (×2): 100 ug via INTRAVENOUS

## 2018-05-06 MED ORDER — PROPOFOL 10 MG/ML IV BOLUS
INTRAVENOUS | Status: DC | PRN
  Administered 2018-05-06: 20 mg via INTRAVENOUS

## 2018-05-06 MED ORDER — PROPOFOL 10 MG/ML IV BOLUS
INTRAVENOUS | Status: AC
  Filled 2018-05-06: qty 20

## 2018-05-06 SURGICAL SUPPLY — 43 items
BLADE CLIPPER SURG (BLADE) IMPLANT
BNDG GAUZE ELAST 4 BULKY (GAUZE/BANDAGES/DRESSINGS) ×3 IMPLANT
CANISTER SUCT 3000ML PPV (MISCELLANEOUS) ×3 IMPLANT
CHLORAPREP W/TINT 26ML (MISCELLANEOUS) ×3 IMPLANT
COVER SURGICAL LIGHT HANDLE (MISCELLANEOUS) ×3 IMPLANT
COVER WAND RF STERILE (DRAPES) ×3 IMPLANT
DRAPE LAPAROSCOPIC ABDOMINAL (DRAPES) ×3 IMPLANT
DRAPE WARM FLUID 44X44 (DRAPE) ×3 IMPLANT
DRSG OPSITE POSTOP 4X10 (GAUZE/BANDAGES/DRESSINGS) IMPLANT
DRSG OPSITE POSTOP 4X8 (GAUZE/BANDAGES/DRESSINGS) IMPLANT
ELECT BLADE 6.5 EXT (BLADE) IMPLANT
ELECT CAUTERY BLADE 6.4 (BLADE) IMPLANT
ELECT REM PT RETURN 9FT ADLT (ELECTROSURGICAL) ×3
ELECTRODE REM PT RTRN 9FT ADLT (ELECTROSURGICAL) ×2 IMPLANT
GAUZE SPONGE 4X4 12PLY STRL LF (GAUZE/BANDAGES/DRESSINGS) ×3 IMPLANT
GLOVE BIO SURGEON STRL SZ8 (GLOVE) ×3 IMPLANT
GLOVE BIOGEL PI IND STRL 8 (GLOVE) ×2 IMPLANT
GLOVE BIOGEL PI INDICATOR 8 (GLOVE) ×1
GOWN STRL REUS W/ TWL LRG LVL3 (GOWN DISPOSABLE) ×2 IMPLANT
GOWN STRL REUS W/ TWL XL LVL3 (GOWN DISPOSABLE) ×2 IMPLANT
GOWN STRL REUS W/TWL LRG LVL3 (GOWN DISPOSABLE) ×1
GOWN STRL REUS W/TWL XL LVL3 (GOWN DISPOSABLE) ×1
KIT BASIN OR (CUSTOM PROCEDURE TRAY) ×3 IMPLANT
KIT TURNOVER KIT B (KITS) ×3 IMPLANT
LIGASURE IMPACT 36 18CM CVD LR (INSTRUMENTS) IMPLANT
NS IRRIG 1000ML POUR BTL (IV SOLUTION) ×6 IMPLANT
PACK GENERAL/GYN (CUSTOM PROCEDURE TRAY) ×3 IMPLANT
PAD ABD 8X10 STRL (GAUZE/BANDAGES/DRESSINGS) ×3 IMPLANT
PAD ARMBOARD 7.5X6 YLW CONV (MISCELLANEOUS) ×3 IMPLANT
PENCIL SMOKE EVACUATOR (MISCELLANEOUS) ×3 IMPLANT
SPECIMEN JAR LARGE (MISCELLANEOUS) IMPLANT
SPONGE LAP 18X18 X RAY DECT (DISPOSABLE) IMPLANT
STAPLER VISISTAT 35W (STAPLE) ×3 IMPLANT
SUCTION POOLE TIP (SUCTIONS) ×3 IMPLANT
SUT PDS AB 1 TP1 96 (SUTURE) ×12 IMPLANT
SUT SILK 2 0 SH CR/8 (SUTURE) ×3 IMPLANT
SUT SILK 2 0 TIES 10X30 (SUTURE) ×3 IMPLANT
SUT SILK 3 0 SH CR/8 (SUTURE) ×3 IMPLANT
SUT SILK 3 0 TIES 10X30 (SUTURE) ×3 IMPLANT
TAPE CLOTH SURG 6X10 WHT LF (GAUZE/BANDAGES/DRESSINGS) ×3 IMPLANT
TOWEL OR 17X26 10 PK STRL BLUE (TOWEL DISPOSABLE) ×3 IMPLANT
TRAY FOLEY MTR SLVR 16FR STAT (SET/KITS/TRAYS/PACK) IMPLANT
YANKAUER SUCT BULB TIP NO VENT (SUCTIONS) IMPLANT

## 2018-05-06 NOTE — Anesthesia Preprocedure Evaluation (Addendum)
Anesthesia Evaluation  Patient identified by MRN, date of birth, ID band Patient awake    Reviewed: Allergy & Precautions, NPO status , Patient's Chart, lab work & pertinent test results  Airway Mallampati: Intubated       Dental   Pulmonary    Pulmonary exam normal        Cardiovascular hypertension, Normal cardiovascular exam     Neuro/Psych    GI/Hepatic   Endo/Other  diabetes, Type 2  Renal/GU      Musculoskeletal   Abdominal   Peds  Hematology   Anesthesia Other Findings   Reproductive/Obstetrics                            Anesthesia Physical Anesthesia Plan  ASA: III  Anesthesia Plan: General   Post-op Pain Management:    Induction: Intravenous  PONV Risk Score and Plan: 2 and Treatment may vary due to age or medical condition  Airway Management Planned: Oral ETT  Additional Equipment:   Intra-op Plan:   Post-operative Plan: Post-operative intubation/ventilation  Informed Consent: I have reviewed the patients History and Physical, chart, labs and discussed the procedure including the risks, benefits and alternatives for the proposed anesthesia with the patient or authorized representative who has indicated his/her understanding and acceptance.     Plan Discussed with: CRNA and Surgeon  Anesthesia Plan Comments:         Anesthesia Quick Evaluation
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
## 2018-05-06 NOTE — Anesthesia Procedure Notes (Signed)
Procedure Name: Intubation Date/Time: 05/15/2018 8:02 AM Performed by: Dorie RankQuinn, Joseh Sjogren M, CRNA Pre-anesthesia Checklist: Patient identified, Emergency Drugs available, Suction available, Patient being monitored and Timeout performed Patient Re-evaluated:Patient Re-evaluated prior to induction Oxygen Delivery Method: Circle system utilized Induction Type: IV induction Placement Confirmation: breath sounds checked- equal and bilateral and positive ETCO2 Dental Injury: Teeth and Oropharynx as per pre-operative assessment  Comments: ETT in situ from 4 N ICU, full report obtained, BS x 4 present, bagged to OR 1 with peep valve, O2 sats 100%, connected to ventilator maintaining ICU settings.  Zigmund GottronH Naamah Boggess, CRNA
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
## 2018-05-06 NOTE — Progress Notes (Signed)
Patient ID: Bruce Henson, male   DOB: 12/23/1965, 53 y.o.   MRN: [id number] Full note to follow. For ex lap and VAC change this AM. I spoke with his wife again. She has some paperwork we will have the RN CM help her with. Violeta Gelinas, MD, MPH, FACS Trauma: [phone] General Surgery: [phone]
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
## 2018-05-06 NOTE — Consult Note (Signed)
WOC Nurse wound follow up Patient receiving care in [facility] 4N17.  Per his primary RN, Ellie, the ordered treatment for the MASD in the groin areas requires changing twice per shift.  The patient continues to third space fluids severely, leading to the weeping.  Please continue the ordered treatment as within the [facility] formulary there is not a more absorptive product for the weeping. Monitor the wound area(s) for worsening of condition such as: Signs/symptoms of infection,  Increase in size,  Development of or worsening of odor, Development of pain, or increased pain at the affected locations.  Notify the medical team if any of these develop.  Helmut Muster, RN, MSN, CWOCN, CNS-BC, pager [phone] Wound type:

## 2018-05-06 NOTE — Anesthesia Postprocedure Evaluation (Signed)
Anesthesia Post Note  Patient: Bruce Henson  Procedure(s) Performed: EXPLORATORY LAPAROTOMY (N/A Abdomen) CLOSURE OF ABDOMINAL WOUND (Abdomen)     Patient location during evaluation: SICU Anesthesia Type: General Level of consciousness: sedated Pain management: pain level controlled Vital Signs Assessment: post-procedure vital signs reviewed and stable Respiratory status: patient remains intubated per anesthesia plan Cardiovascular status: stable Postop Assessment: no apparent nausea or vomiting Anesthetic complications: no    Last Vitals:  Vitals:   05/22/2018 0900 05/24/2018 1000  BP: 120/78 135/86  Pulse: (!) 108 99  Resp: (!) 30 (!) 30  Temp: 36.8 C   SpO2: 95% 99%    Last Pain:  Vitals:   05/05/2018 0900  TempSrc: Axillary                 Cindia Hustead DAVID

## 2018-05-06 NOTE — Transfer of Care (Signed)
Immediate Anesthesia Transfer of Care Note  Patient: Bruce Henson  Procedure(s) Performed: EXPLORATORY LAPAROTOMY (N/A Abdomen) CLOSURE OF ABDOMINAL WOUND (Abdomen)  Patient Location: SICU  Anesthesia Type:General  Level of Consciousness: Patient remains intubated per anesthesia plan  Airway & Oxygen Therapy: Patient remains intubated per anesthesia plan and Patient placed on Ventilator (see vital sign flow sheet for setting)  Post-op Assessment: Post -op Vital signs reviewed and stable  Post vital signs: stable  Last Vitals:  Vitals Value Taken Time  BP    Temp    Pulse    Resp    SpO2      Last Pain:  Vitals:   05/28/2018 0600  TempSrc: Oral         Complications: No apparent anesthesia complications

## 2018-05-06 NOTE — Progress Notes (Signed)
CRRT stopped at 0645. Catheters heparin locked. Upon returning to unit CRRT resumed at 0845 with no complications. Will continue to monitor. Dicie Beam RN BSN .
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
## 2018-05-07 ENCOUNTER — Inpatient Hospital Stay (HOSPITAL_COMMUNITY): Payer: 59

## 2018-05-07 DIAGNOSIS — Z789 Other specified health status: Secondary | ICD-10-CM

## 2018-05-07 DIAGNOSIS — J9621 Acute and chronic respiratory failure with hypoxia: Secondary | ICD-10-CM

## 2018-05-07 LAB — RENAL FUNCTION PANEL
ALBUMIN: 1.7 g/dL — AB (ref 3.5–5.0)
Albumin: 1.7 g/dL — ABNORMAL LOW (ref 3.5–5.0)
Anion gap: 15 (ref 5–15)
Anion gap: 14 (ref 5–15)
BUN: 31 mg/dL — ABNORMAL HIGH (ref 6–20)
BUN: 37 mg/dL — ABNORMAL HIGH (ref 6–20)
CO2: 21 mmol/L — AB (ref 22–32)
CO2: 19 mmol/L — ABNORMAL LOW (ref 22–32)
Calcium: 7.2 mg/dL — ABNORMAL LOW (ref 8.9–10.3)
Calcium: 7.6 mg/dL — ABNORMAL LOW (ref 8.9–10.3)
Chloride: 100 mmol/L (ref 98–111)
Chloride: 102 mmol/L (ref 98–111)
Creatinine, Ser: 2.97 mg/dL — ABNORMAL HIGH (ref 0.61–1.24)
Creatinine, Ser: 2.96 mg/dL — ABNORMAL HIGH (ref 0.61–1.24)
GFR calc Af Amer: 27 mL/min — ABNORMAL LOW (ref >60)
GFR calc Af Amer: 27 mL/min — ABNORMAL LOW (ref >60)
GFR calc non Af Amer: 23 mL/min — ABNORMAL LOW (ref >60)
GFR, EST NON AFRICAN AMERICAN: 23 mL/min — AB (ref >60)
Glucose, Bld: 127 mg/dL — ABNORMAL HIGH (ref 70–99)
Glucose, Bld: 160 mg/dL — ABNORMAL HIGH (ref 70–99)
Phosphorus: 3.2 mg/dL (ref 2.5–4.6)
Phosphorus: 3.9 mg/dL (ref 2.5–4.6)
Potassium: 4 mmol/L (ref 3.5–5.1)
Potassium: 4.6 mmol/L (ref 3.5–5.1)
Sodium: 136 mmol/L (ref 135–145)
Sodium: 135 mmol/L (ref 135–145)

## 2018-05-07 LAB — GLUCOSE, CAPILLARY
GLUCOSE-CAPILLARY: 111 mg/dL — AB (ref 70–99)
Glucose-Capillary: 119 mg/dL — ABNORMAL HIGH (ref 70–99)
Glucose-Capillary: 110 mg/dL — ABNORMAL HIGH (ref 70–99)
Glucose-Capillary: 114 mg/dL — ABNORMAL HIGH (ref 70–99)
Glucose-Capillary: 121 mg/dL — ABNORMAL HIGH (ref 70–99)
Glucose-Capillary: 107 mg/dL — ABNORMAL HIGH (ref 70–99)

## 2018-05-07 LAB — TRIGLYCERIDES: TRIGLYCERIDES: 432 mg/dL — AB (ref <150)

## 2018-05-07 LAB — MAGNESIUM: Magnesium: 2.6 mg/dL — ABNORMAL HIGH (ref 1.7–2.4)

## 2018-05-07 LAB — CBC
HCT: 30.7 % — ABNORMAL LOW (ref 39.0–52.0)
Hemoglobin: 10.2 g/dL — ABNORMAL LOW (ref 13.0–17.0)
MCH: 28.8 pg (ref 26.0–34.0)
MCHC: 33.2 g/dL (ref 30.0–36.0)
MCV: 86.7 fL (ref 80.0–100.0)
Platelets: 82 10*3/uL — ABNORMAL LOW (ref 150–400)
RBC: 3.54 MIL/uL — ABNORMAL LOW (ref 4.22–5.81)
RDW: 15.4 % (ref 11.5–15.5)
WBC: 17.7 10*3/uL — ABNORMAL HIGH (ref 4.0–10.5)
nRBC: 12.5 % — ABNORMAL HIGH (ref 0.0–0.2)

## 2018-05-07 MED ORDER — VITAL HIGH PROTEIN PO LIQD
1000.0000 mL | ORAL | Status: DC
  Administered 2018-05-07: 1000 mL
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
## 2018-05-07 NOTE — Progress Notes (Signed)
ETT found at 24 at the lip. Advanced to 26 per NP order. Patient tolerated well. RT will continue to monitor.
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
## 2018-05-08 ENCOUNTER — Inpatient Hospital Stay (HOSPITAL_COMMUNITY): Payer: 59

## 2018-05-08 DIAGNOSIS — S334XXA Traumatic rupture of symphysis pubis, initial encounter: Secondary | ICD-10-CM

## 2018-05-08 DIAGNOSIS — S72351A Displaced comminuted fracture of shaft of right femur, initial encounter for closed fracture: Secondary | ICD-10-CM

## 2018-05-08 DIAGNOSIS — I10 Essential (primary) hypertension: Secondary | ICD-10-CM

## 2018-05-08 DIAGNOSIS — S42201A Unspecified fracture of upper end of right humerus, initial encounter for closed fracture: Secondary | ICD-10-CM

## 2018-05-08 DIAGNOSIS — I96 Gangrene, not elsewhere classified: Secondary | ICD-10-CM

## 2018-05-08 DIAGNOSIS — E119 Type 2 diabetes mellitus without complications: Secondary | ICD-10-CM

## 2018-05-08 DIAGNOSIS — S81812A Laceration without foreign body, left lower leg, initial encounter: Secondary | ICD-10-CM

## 2018-05-08 DIAGNOSIS — S332XXA Dislocation of sacroiliac and sacrococcygeal joint, initial encounter: Secondary | ICD-10-CM

## 2018-05-08 DIAGNOSIS — I723 Aneurysm of iliac artery: Secondary | ICD-10-CM

## 2018-05-08 DIAGNOSIS — S329XXA Fracture of unspecified parts of lumbosacral spine and pelvis, initial encounter for closed fracture: Secondary | ICD-10-CM

## 2018-05-08 LAB — RENAL FUNCTION PANEL
Albumin: 1.8 g/dL — ABNORMAL LOW (ref 3.5–5.0)
Albumin: 1.8 g/dL — ABNORMAL LOW (ref 3.5–5.0)
Anion gap: 14 (ref 5–15)
Anion gap: 13 (ref 5–15)
BUN: 38 mg/dL — AB (ref 6–20)
BUN: 41 mg/dL — ABNORMAL HIGH (ref 6–20)
CO2: 20 mmol/L — ABNORMAL LOW (ref 22–32)
CO2: 20 mmol/L — ABNORMAL LOW (ref 22–32)
Calcium: 7.7 mg/dL — ABNORMAL LOW (ref 8.9–10.3)
Calcium: 8 mg/dL — ABNORMAL LOW (ref 8.9–10.3)
Chloride: 100 mmol/L (ref 98–111)
Chloride: 102 mmol/L (ref 98–111)
Creatinine, Ser: 2.68 mg/dL — ABNORMAL HIGH (ref 0.61–1.24)
Creatinine, Ser: 2.41 mg/dL — ABNORMAL HIGH (ref 0.61–1.24)
GFR calc Af Amer: 30 mL/min — ABNORMAL LOW (ref >60)
GFR calc non Af Amer: 26 mL/min — ABNORMAL LOW (ref >60)
GFR calc non Af Amer: 30 mL/min — ABNORMAL LOW (ref >60)
GFR, EST AFRICAN AMERICAN: 34 mL/min — AB (ref >60)
Glucose, Bld: 187 mg/dL — ABNORMAL HIGH (ref 70–99)
Glucose, Bld: 191 mg/dL — ABNORMAL HIGH (ref 70–99)
Phosphorus: 4 mg/dL (ref 2.5–4.6)
Phosphorus: 3.5 mg/dL (ref 2.5–4.6)
Potassium: 4.5 mmol/L (ref 3.5–5.1)
Potassium: 4.5 mmol/L (ref 3.5–5.1)
Sodium: 134 mmol/L — ABNORMAL LOW (ref 135–145)
Sodium: 135 mmol/L (ref 135–145)

## 2018-05-08 LAB — HEPATIC FUNCTION PANEL
ALT: 814 U/L — ABNORMAL HIGH (ref 0–44)
AST: 986 U/L — AB (ref 15–41)
Albumin: 1.7 g/dL — ABNORMAL LOW (ref 3.5–5.0)
Alkaline Phosphatase: 179 U/L — ABNORMAL HIGH (ref 38–126)
Bilirubin, Direct: 18.2 mg/dL — ABNORMAL HIGH (ref 0.0–0.2)
Indirect Bilirubin: 6.5 mg/dL — ABNORMAL HIGH (ref 0.3–0.9)
Total Bilirubin: 24.7 mg/dL (ref 0.3–1.2)
Total Protein: 5.3 g/dL — ABNORMAL LOW (ref 6.5–8.1)

## 2018-05-08 LAB — CBC
HCT: 31.2 % — ABNORMAL LOW (ref 39.0–52.0)
Hemoglobin: 10.2 g/dL — ABNORMAL LOW (ref 13.0–17.0)
MCH: 28.9 pg (ref 26.0–34.0)
MCHC: 32.7 g/dL (ref 30.0–36.0)
MCV: 88.4 fL (ref 80.0–100.0)
Platelets: 97 10*3/uL — ABNORMAL LOW (ref 150–400)
RBC: 3.53 MIL/uL — ABNORMAL LOW (ref 4.22–5.81)
RDW: 16.1 % — ABNORMAL HIGH (ref 11.5–15.5)
WBC: 30.3 10*3/uL — ABNORMAL HIGH (ref 4.0–10.5)
nRBC: 12.2 % — ABNORMAL HIGH (ref 0.0–0.2)

## 2018-05-08 LAB — GLUCOSE, CAPILLARY
Glucose-Capillary: 101 mg/dL — ABNORMAL HIGH (ref 70–99)
Glucose-Capillary: 184 mg/dL — ABNORMAL HIGH (ref 70–99)
Glucose-Capillary: 116 mg/dL — ABNORMAL HIGH (ref 70–99)
Glucose-Capillary: 183 mg/dL — ABNORMAL HIGH (ref 70–99)
Glucose-Capillary: 121 mg/dL — ABNORMAL HIGH (ref 70–99)
Glucose-Capillary: 166 mg/dL — ABNORMAL HIGH (ref 70–99)

## 2018-05-08 LAB — MAGNESIUM: MAGNESIUM: 2.9 mg/dL — AB (ref 1.7–2.4)

## 2018-05-08 LAB — PROTIME-INR
INR: 1.14
Prothrombin Time: 14.5 seconds (ref 11.4–15.2)

## 2018-05-08 LAB — AMMONIA: Ammonia: 23 umol/L (ref 9–35)

## 2018-05-08 MED ORDER — ALTEPLASE 2 MG IJ SOLR
2.0000 mg | Freq: Once | INTRAMUSCULAR | Status: AC
  Administered 2018-05-08: 2 mg

## 2018-05-08 MED ORDER — VITAL HIGH PROTEIN PO LIQD: 1000.0000 mL | ORAL | Status: DC

## 2018-05-08 MED ORDER — PIVOT 1.5 CAL PO LIQD
1000.0000 mL | ORAL | Status: AC
  Administered 2018-05-08 – 2018-05-11 (×4): 1000 mL
  Filled 2018-05-08 (×3): qty 1000
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
## 2018-05-08 NOTE — Progress Notes (Signed)
CRITICAL VALUE ALERT  Critical Value:  Total bilirubin  Date & Time Notied:  05/08/2018 1100  Provider Notified: Dr. Andrey Campanile  Orders Received/Actions taken: none at this time. MD stated will dig deeper into the chart and get back to me
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
## 2018-05-08 NOTE — Progress Notes (Signed)
A Line MAP is dropping under 65. Pt fluid removal was changed from 150 to 100 to see if pressure would improve without having to start bk on pressors. Will continue to monitor.

## 2018-05-09 ENCOUNTER — Inpatient Hospital Stay (HOSPITAL_COMMUNITY): Payer: 59

## 2018-05-09 ENCOUNTER — Encounter (HOSPITAL_COMMUNITY): Payer: Self-pay | Admitting: General Surgery

## 2018-05-09 LAB — RENAL FUNCTION PANEL
Albumin: 1.9 g/dL — ABNORMAL LOW (ref 3.5–5.0)
Albumin: 1.9 g/dL — ABNORMAL LOW (ref 3.5–5.0)
Anion gap: 14 (ref 5–15)
Anion gap: 14 (ref 5–15)
BUN: 42 mg/dL — ABNORMAL HIGH (ref 6–20)
BUN: 44 mg/dL — ABNORMAL HIGH (ref 6–20)
CALCIUM: 7.9 mg/dL — AB (ref 8.9–10.3)
CALCIUM: 8.1 mg/dL — AB (ref 8.9–10.3)
CO2: 19 mmol/L — ABNORMAL LOW (ref 22–32)
CO2: 20 mmol/L — AB (ref 22–32)
Chloride: 102 mmol/L (ref 98–111)
Chloride: 100 mmol/L (ref 98–111)
Creatinine, Ser: 2.3 mg/dL — ABNORMAL HIGH (ref 0.61–1.24)
Creatinine, Ser: 2.2 mg/dL — ABNORMAL HIGH (ref 0.61–1.24)
GFR calc Af Amer: 36 mL/min — ABNORMAL LOW (ref >60)
GFR calc Af Amer: 38 mL/min — ABNORMAL LOW (ref >60)
GFR calc non Af Amer: 31 mL/min — ABNORMAL LOW (ref >60)
GFR calc non Af Amer: 33 mL/min — ABNORMAL LOW (ref >60)
Glucose, Bld: 200 mg/dL — ABNORMAL HIGH (ref 70–99)
Glucose, Bld: 223 mg/dL — ABNORMAL HIGH (ref 70–99)
Phosphorus: 4.3 mg/dL (ref 2.5–4.6)
Phosphorus: 3.6 mg/dL (ref 2.5–4.6)
Potassium: 4.6 mmol/L (ref 3.5–5.1)
Potassium: 4.6 mmol/L (ref 3.5–5.1)
Sodium: 135 mmol/L (ref 135–145)
Sodium: 134 mmol/L — ABNORMAL LOW (ref 135–145)

## 2018-05-09 LAB — GLUCOSE, CAPILLARY
Glucose-Capillary: 200 mg/dL — ABNORMAL HIGH (ref 70–99)
Glucose-Capillary: 136 mg/dL — ABNORMAL HIGH (ref 70–99)
Glucose-Capillary: 156 mg/dL — ABNORMAL HIGH (ref 70–99)
Glucose-Capillary: 183 mg/dL — ABNORMAL HIGH (ref 70–99)
Glucose-Capillary: 193 mg/dL — ABNORMAL HIGH (ref 70–99)

## 2018-05-09 LAB — HEPATIC FUNCTION PANEL
ALT: 630 U/L — ABNORMAL HIGH (ref 0–44)
AST: 795 U/L — ABNORMAL HIGH (ref 15–41)
Albumin: 1.8 g/dL — ABNORMAL LOW (ref 3.5–5.0)
Alkaline Phosphatase: 233 U/L — ABNORMAL HIGH (ref 38–126)
Bilirubin, Direct: 19.4 mg/dL — ABNORMAL HIGH (ref 0.0–0.2)
Total Bilirubin: 31.1 mg/dL (ref 0.3–1.2)
Total Protein: 5.9 g/dL — ABNORMAL LOW (ref 6.5–8.1)

## 2018-05-09 LAB — CBC
HCT: 31 % — ABNORMAL LOW (ref 39.0–52.0)
Hemoglobin: 10.2 g/dL — ABNORMAL LOW (ref 13.0–17.0)
MCH: 29.5 pg (ref 26.0–34.0)
MCHC: 32.9 g/dL (ref 30.0–36.0)
MCV: 89.6 fL (ref 80.0–100.0)
Platelets: 115 10*3/uL — ABNORMAL LOW (ref 150–400)
RBC: 3.46 MIL/uL — ABNORMAL LOW (ref 4.22–5.81)
RDW: 16.4 % — ABNORMAL HIGH (ref 11.5–15.5)
WBC: 36.4 10*3/uL — ABNORMAL HIGH (ref 4.0–10.5)
nRBC: 24.7 % — ABNORMAL HIGH (ref 0.0–0.2)

## 2018-05-09 LAB — MAGNESIUM: Magnesium: 2.9 mg/dL — ABNORMAL HIGH (ref 1.7–2.4)

## 2018-05-09 MED ORDER — PIPERACILLIN-TAZOBACTAM 3.375 G IVPB 30 MIN
3.3750 g | Freq: Four times a day (QID) | INTRAVENOUS | Status: DC
  Administered 2018-05-09 – 2018-05-14 (×22): 3.375 g via INTRAVENOUS
  Filled 2018-05-09 (×37): qty 50

## 2018-05-09 MED ORDER — SODIUM CHLORIDE 0.9 % IV SOLN
INTRAVENOUS | Status: DC | PRN
  Administered 2018-05-09 – 2018-05-11 (×2): 250 mL via INTRAVENOUS

## 2018-05-09 MED ORDER — VANCOMYCIN HCL 10 G IV SOLR
2500.0000 mg | Freq: Once | INTRAVENOUS | Status: AC
  Administered 2018-05-09: 2500 mg via INTRAVENOUS
  Filled 2018-05-09: qty 2500

## 2018-05-09 MED ORDER — VANCOMYCIN HCL 10 G IV SOLR
1250.0000 mg | INTRAVENOUS | Status: DC
  Administered 2018-05-10 – 2018-05-14 (×4): 1250 mg via INTRAVENOUS
  Filled 2018-05-09 (×7): qty 1250

## 2018-05-09 NOTE — Progress Notes (Signed)
CRITICAL VALUE ALERT  Critical Value:  Total Bilirubin 31.1  Date & Time Notied:  05/09/2018 1130  Provider Notified: Dr. Janee Mornhompson  Orders Received/Actions taken: No new orders at this time.  Also notified Dr. Janee Mornhompson of pt's WBC.

## 2018-05-09 NOTE — Progress Notes (Addendum)
Pt currently on the ventilator on fentanyl gtt and minimally responsive.  Pt is currently blinking once for "Yes" and twice for "No"
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
## 2018-05-09 NOTE — Progress Notes (Signed)
Patient ID: Bruce Henson, male   DOB: 1965-09-24, 53 y.o.   MRN: [id number] Follow up - Trauma Critical Care  Patient Details:    Bruce Henson is an 53 y.o. male.  Lines/tubes : Airway 7.5 mm (Active)  Secured at (cm) 25 cm 05/09/2018  7:26 AM  Measured From Lips 05/09/2018  7:26 AM  Secured Location Left 05/09/2018  7:26 AM  Secured By [employer] Tube Holder 05/09/2018  7:26 AM  Tube Holder Repositioned Yes 05/09/2018  7:26 AM  Cuff Pressure (cm H2O) 30 cm H2O 05/09/2018  7:26 AM  Site Condition Dry 05/09/2018  7:26 AM     CVC Triple Lumen 04/14/2018 Left Internal jugular (Active)  Indication for Insertion or Continuance of Line Prolonged intravenous therapies 05/08/2018  8:00 PM  Site Assessment Clean;Dry;Intact 05/08/2018  8:00 PM  Proximal Lumen Status Infusing 05/08/2018  8:00 PM  Medial Lumen Status In-line blood sampling system in place 05/08/2018  8:00 PM  Distal Lumen Status Saline locked 05/08/2018  8:00 PM  Dressing Type Transparent;Occlusive 05/08/2018  8:00 AM  Dressing Status Clean;Dry;Intact;Antimicrobial disc in place 05/08/2018  8:00 PM  Line Care Connections checked and tightened 05/08/2018  8:00 PM  Dressing Intervention Dressing changed;Antimicrobial disc changed 05/08/2018  5:00 AM  Dressing Change Due 05/15/18 05/08/2018  8:00 PM     Arterial Line 04/29/2018 Radial (Active)  Site Assessment Clean;Dry;Intact 05/08/2018  8:00 PM  Line Status Positional 05/08/2018  8:00 AM  Art Line Waveform Appropriate 05/08/2018  8:00 PM  Art Line Interventions Zeroed and calibrated 05/08/2018  8:00 PM  Color/Movement/Sensation Capillary refill less than 3 sec;Weakness to fingers/toes 05/08/2018  8:00 PM  Dressing Type Transparent 05/08/2018  8:00 PM  Dressing Status Clean;Dry;Intact;Antimicrobial disc in place 05/08/2018  8:00 PM  Dressing Change Due 05/15/18 05/08/2018  8:00 PM     NG/OG Tube Orogastric 18 Fr. Center mouth Confirmed by Surgical Manipulation Documented cm marking at nare/ corner of mouth (Active)  Site  Assessment Clean;Dry;Intact 05/08/2018  8:00 PM  Ongoing Placement Verification No acute changes, not attributed to clinical condition;No change in respiratory status 05/08/2018  8:00 PM  Status Clamped 05/08/2018  8:00 PM  Amount of suction 90 mmHg 06/01/2018  4:00 AM  Drainage Appearance Bile 05/21/2018  4:00 AM  Output (mL) 0 mL 06/01/2018  4:00 PM     Rectal Tube/Pouch (Active)  Output (mL) 0 mL 05/13/2018  4:00 PM     Urethral Catheter Urology MD 14 Fr. (Active)  Indication for Insertion or Continuance of Catheter Bladder outlet obstruction / other urologic reason 05/08/2018  8:00 PM  Site Assessment Swelling;Bleeding 05/08/2018  8:00 PM  Catheter Maintenance Bag below level of bladder;Catheter secured;Drainage bag/tubing not touching floor;Insertion date on drainage bag;No dependent loops;Seal intact 05/08/2018  8:00 PM  Collection Container Standard drainage bag 05/08/2018  8:00 PM  Securement Method Securing device (Describe) 05/08/2018  8:00 PM  Urinary Catheter Interventions Unclamped 05/08/2018  8:00 PM  Output (mL) 5 mL 05/21/2018  8:00 PM    Microbiology/Sepsis markers: Results for orders placed or performed during the hospital encounter of 04/06/2018  MRSA PCR Screening     Status: None   Collection Time: 05/03/2018  5:03 AM  Result Value Ref Range Status   MRSA by PCR NEGATIVE NEGATIVE Final    Comment:        The GeneXpert MRSA Assay (FDA approved for NASAL specimens only), is one component of a comprehensive MRSA colonization surveillance program. It is not intended  to diagnose MRSA infection nor to guide or monitor treatment for MRSA infections. Performed at [facility], [address], [place], [place] [address]   Surgical pcr screen     Status: None   Collection Time: 05/04/18  3:31 PM  Result Value Ref Range Status   MRSA, PCR NEGATIVE NEGATIVE Final   Staphylococcus aureus NEGATIVE NEGATIVE Final    Comment: (NOTE) The Xpert SA Assay (FDA approved for NASAL specimens in  patients 17 years of age and older), is one component of a comprehensive surveillance program. It is not intended to diagnose infection nor to guide or monitor treatment. Performed at [facility], [address] [place], [place] [address]     Anti-infectives:  Anti-infectives (From admission, onward)   Start     Dose/Rate Route Frequency Ordered Stop   05/03/18 1200  piperacillin-tazobactam (ZOSYN) IVPB 2.25 g  Status:  Discontinued     2.25 g 100 mL/hr over 30 Minutes Intravenous Every 6 hours 05/03/18 0640 05/08/18 0757   04/28/2018 0600  ceFAZolin (ANCEF) 3 g in dextrose 5 % 50 mL IVPB  Status:  Discontinued     3 g 100 mL/hr over 30 Minutes Intravenous On call to O.R. 04/26/2018 0523 04/22/2018 1300   04/12/2018 2200  piperacillin-tazobactam (ZOSYN) IVPB 3.375 g  Status:  Discontinued     3.375 g 12.5 mL/hr over 4 Hours Intravenous Every 8 hours 04/04/2018 1935 05/03/18 0636   04/14/2018 1930  ceFAZolin (ANCEF) IVPB 2g/100 mL premix     2 g 200 mL/hr over 30 Minutes Intravenous  Once 04/08/2018 1919 05/15/2018 0175      Best Practice/Protocols:  VTE Prophylaxis: Mechanical Continous Sedation  Consults: Treatment Team:  Marcine Matar, MD Karl Ito, MD Haddix, Gillie Manners, MD Maxie Barb, MD Estanislado Emms, MD Sherren Kerns, MD   Subjective:    Overnight Issues:   Objective:  Vital signs for last 24 hours: Temp:  [96.8 F (36 C)-98 F (36.7 C)] 97.5 F (36.4 C) (01/06 0400) Pulse Rate:  [83-99] 95 (01/06 0726) Resp:  [0-32] 30 (01/06 0726) BP: (82-141)/(47-76) 141/61 (01/06 0726) SpO2:  [88 %-100 %] 100 % (01/06 0726) Arterial Line BP: (87-150)/(49-74) 125/55 (01/06 0700) FiO2 (%):  [40 %-100 %] 40 % (01/06 0726)  Hemodynamic parameters for last 24 hours:    Intake/Output from previous day: 01/05 0701 - 01/06 0700 In: 649.4 [I.V.:159.7; NG/GT:489.7] Out: 3763   Intake/Output this shift: No intake/output data recorded.  Vent settings  for last 24 hours: Vent Mode: PRVC FiO2 (%):  [40 %-100 %] 40 % Set Rate:  [30 bmp] 30 bmp Vt Set:  [500 mL] 500 mL PEEP:  [5 cmH20] 5 cmH20 Plateau Pressure:  [20 cmH20-23 cmH20] 20 cmH20  Physical Exam:  General: on vent Neuro: eyes open, not F/C HEENT/Neck: ETT Resp: some R rhonchi CVS: RRR GI: soft, wound clean, some BS Extremities: B toes with ischemia/black areas, L shin lac intact  Results for orders placed or performed during the hospital encounter of 04/07/2018 (from the past 24 hour(s))  Hepatic function panel     Status: Abnormal   Collection Time: 05/08/18  8:47 AM  Result Value Ref Range   Total Protein 5.3 (L) 6.5 - 8.1 g/dL   Albumin 1.7 (L) 3.5 - 5.0 g/dL   AST 102 (H) 15 - 41 U/L   ALT 814 (H) 0 - 44 U/L   Alkaline Phosphatase 179 (H) 38 -  126 U/L   Total Bilirubin 24.7 (HH) 0.3 - 1.2 mg/dL   Bilirubin, Direct 16.118.2 (H) 0.0 - 0.2 mg/dL   Indirect Bilirubin 6.5 (H) 0.3 - 0.9 mg/dL  Glucose, capillary     Status: Abnormal   Collection Time: 05/08/18 11:47 AM  Result Value Ref Range   Glucose-Capillary 116 (H) 70 - 99 mg/dL   Comment 1 Notify RN    Comment 2 Document in Chart   Renal function panel (daily at 1600)     Status: Abnormal   Collection Time: 05/08/18  3:16 PM  Result Value Ref Range   Sodium 135 135 - 145 mmol/L   Potassium 4.5 3.5 - 5.1 mmol/L   Chloride 102 98 - 111 mmol/L   CO2 20 (L) 22 - 32 mmol/L   Glucose, Bld 191 (H) 70 - 99 mg/dL   BUN 41 (H) 6 - 20 mg/dL   Creatinine, Ser 0.962.41 (H) 0.61 - 1.24 mg/dL   Calcium 8.0 (L) 8.9 - 10.3 mg/dL   Phosphorus 3.5 2.5 - 4.6 mg/dL   Albumin 1.8 (L) 3.5 - 5.0 g/dL   GFR calc non Af Amer 30 (L) >60 mL/min   GFR calc Af Amer 34 (L) >60 mL/min   Anion gap 13 5 - 15  Ammonia     Status: None   Collection Time: 05/08/18  3:16 PM  Result Value Ref Range   Ammonia 23 9 - 35 umol/L  Protime-INR     Status: None   Collection Time: 05/08/18  3:16 PM  Result Value Ref Range   Prothrombin Time 14.5 11.4 -  15.2 seconds   INR 1.14   Glucose, capillary     Status: Abnormal   Collection Time: 05/08/18  3:49 PM  Result Value Ref Range   Glucose-Capillary 183 (H) 70 - 99 mg/dL   Comment 1 Notify RN    Comment 2 Document in Chart   Glucose, capillary     Status: Abnormal   Collection Time: 05/08/18  7:20 PM  Result Value Ref Range   Glucose-Capillary 121 (H) 70 - 99 mg/dL  Glucose, capillary     Status: Abnormal   Collection Time: 05/08/18 11:03 PM  Result Value Ref Range   Glucose-Capillary 166 (H) 70 - 99 mg/dL  Renal function panel (daily at 0500)     Status: Abnormal (Preliminary result)   Collection Time: 05/09/18  3:48 AM  Result Value Ref Range   Sodium 135 135 - 145 mmol/L   Potassium 4.6 3.5 - 5.1 mmol/L   Chloride 102 98 - 111 mmol/L   CO2 PENDING 22 - 32 mmol/L   Glucose, Bld 200 (H) 70 - 99 mg/dL   BUN 42 (H) 6 - 20 mg/dL   Creatinine, Ser 0.452.30 (H) 0.61 - 1.24 mg/dL   Calcium 7.9 (L) 8.9 - 10.3 mg/dL   Phosphorus 4.3 2.5 - 4.6 mg/dL   Albumin 1.9 (L) 3.5 - 5.0 g/dL   GFR calc non Af Amer 31 (L) >60 mL/min   GFR calc Af Amer 36 (L) >60 mL/min   Anion gap PENDING 5 - 15  Magnesium     Status: Abnormal   Collection Time: 05/09/18  3:48 AM  Result Value Ref Range   Magnesium 2.9 (H) 1.7 - 2.4 mg/dL  Hepatic function panel     Status: Abnormal (Preliminary result)   Collection Time: 05/09/18  3:48 AM  Result Value Ref Range   Total Protein 5.9 (L) 6.5 - 8.1  g/dL   Albumin 1.8 (L) 3.5 - 5.0 g/dL   AST 161 (H) 15 - 41 U/L   ALT 630 (H) 0 - 44 U/L   Alkaline Phosphatase 233 (H) 38 - 126 U/L   Total Bilirubin PENDING 0.3 - 1.2 mg/dL   Bilirubin, Direct PENDING 0.0 - 0.2 mg/dL   Indirect Bilirubin NOT CALCULATED 0.3 - 0.9 mg/dL  Glucose, capillary     Status: Abnormal   Collection Time: 05/09/18  3:50 AM  Result Value Ref Range   Glucose-Capillary 200 (H) 70 - 99 mg/dL  Glucose, capillary     Status: Abnormal   Collection Time: 05/09/18  7:29 AM  Result Value Ref Range    Glucose-Capillary 136 (H) 70 - 99 mg/dL   Comment 1 Notify RN    Comment 2 Document in Chart     Assessment & Plan: Present on Admission: **None**    LOS: 8 days   Additional comments:I reviewed the patient's new clinical lab test results. . [facility] Open book pelvic FX - S/P ex fix and angioembolization, ORIF planned this week by Dr. Jena Gauss Acute hypoxic ventilator dependent respiratory failure - start weaning. Anticipate trach ABL anemia  Abdominal compartment syndrome - S/P ex lap and packing 12/29 by Dr. Dwain Sarna, S/P ex lap and removal of packs 12/30 Dr. Sheliah Hatch, S/P ex lap and closure 1/3 by Dr. Janee Morn Ischemic toes B - appreciate Dr. Darrick Penna consult and he will F/U. Try to limit pressors. AKI - CRRT per Renal  - appreciate their assist T5 endplate and lumbar TVP FXs - no treatment needed per Dr. Newell Coral R femur FX - Dr. Jena Gauss plans surgery  Hyperbilirubinemia - due to reabsorbtion of hematoma CV - on small dose levo during CRRT FEN - TF, bili as above VTE - no Lovenox with PLTs < 100k ID - CBC now - WBC 30 yesterday. CX resp, blood CX P 1/5, will start empiric ABX if WBC still up Dispo - ICU I spoke with his wife at the bedside Critical Care Total Time*: 75 Minutes  Violeta Gelinas, MD, MPH, FACS Trauma: [phone] General Surgery: [phone]  05/09/2018  *Care during the described time interval was provided by me. I have reviewed this patient's available data, including medical history, events of note, physical examination and test results as part of my evaluation.
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
## 2018-05-09 NOTE — Progress Notes (Signed)
CSW has reviewed the notes and the chart. Patient remains on a vent at this time. CSW will continue to follow the patient.   Drucilla Schmidt, MSW, LCSW-A Clinical Social Worker [employer]

## 2018-05-10 LAB — HEPATITIS PANEL, ACUTE
HCV Ab: <0.1 s/co ratio (ref 0.0–0.9)
Hep A IgM: NEGATIVE
Hep B C IgM: NEGATIVE
Hepatitis B Surface Ag: NEGATIVE

## 2018-05-10 LAB — RENAL FUNCTION PANEL
Albumin: 1.8 g/dL — ABNORMAL LOW (ref 3.5–5.0)
Albumin: 1.7 g/dL — ABNORMAL LOW (ref 3.5–5.0)
Anion gap: 14 (ref 5–15)
Anion gap: 12 (ref 5–15)
BUN: 44 mg/dL — ABNORMAL HIGH (ref 6–20)
BUN: 41 mg/dL — ABNORMAL HIGH (ref 6–20)
CO2: 21 mmol/L — ABNORMAL LOW (ref 22–32)
CO2: 22 mmol/L (ref 22–32)
CREATININE: 1.87 mg/dL — AB (ref 0.61–1.24)
Calcium: 8.1 mg/dL — ABNORMAL LOW (ref 8.9–10.3)
Calcium: 8.1 mg/dL — ABNORMAL LOW (ref 8.9–10.3)
Chloride: 101 mmol/L (ref 98–111)
Chloride: 102 mmol/L (ref 98–111)
Creatinine, Ser: 2.13 mg/dL — ABNORMAL HIGH (ref 0.61–1.24)
GFR calc Af Amer: 40 mL/min — ABNORMAL LOW (ref >60)
GFR calc non Af Amer: 35 mL/min — ABNORMAL LOW (ref >60)
GFR calc non Af Amer: 40 mL/min — ABNORMAL LOW (ref >60)
GFR, EST AFRICAN AMERICAN: 47 mL/min — AB (ref >60)
Glucose, Bld: 221 mg/dL — ABNORMAL HIGH (ref 70–99)
Glucose, Bld: 175 mg/dL — ABNORMAL HIGH (ref 70–99)
Phosphorus: 3.7 mg/dL (ref 2.5–4.6)
Phosphorus: 3.9 mg/dL (ref 2.5–4.6)
Potassium: 4.2 mmol/L (ref 3.5–5.1)
Potassium: 4.7 mmol/L (ref 3.5–5.1)
SODIUM: 136 mmol/L (ref 135–145)
Sodium: 136 mmol/L (ref 135–145)

## 2018-05-10 LAB — CBC
HCT: 28.9 % — ABNORMAL LOW (ref 39.0–52.0)
Hemoglobin: 9.4 g/dL — ABNORMAL LOW (ref 13.0–17.0)
MCH: 29.5 pg (ref 26.0–34.0)
MCHC: 32.5 g/dL (ref 30.0–36.0)
MCV: 90.6 fL (ref 80.0–100.0)
Platelets: 107 10*3/uL — ABNORMAL LOW (ref 150–400)
RBC: 3.19 MIL/uL — ABNORMAL LOW (ref 4.22–5.81)
RDW: 17.2 % — ABNORMAL HIGH (ref 11.5–15.5)
WBC: 33.3 10*3/uL — ABNORMAL HIGH (ref 4.0–10.5)
nRBC: 22.5 % — ABNORMAL HIGH (ref 0.0–0.2)

## 2018-05-10 LAB — GLUCOSE, CAPILLARY
GLUCOSE-CAPILLARY: 177 mg/dL — AB (ref 70–99)
GLUCOSE-CAPILLARY: 161 mg/dL — AB (ref 70–99)
Glucose-Capillary: 194 mg/dL — ABNORMAL HIGH (ref 70–99)
Glucose-Capillary: 170 mg/dL — ABNORMAL HIGH (ref 70–99)
Glucose-Capillary: 128 mg/dL — ABNORMAL HIGH (ref 70–99)
Glucose-Capillary: 159 mg/dL — ABNORMAL HIGH (ref 70–99)
Glucose-Capillary: 158 mg/dL — ABNORMAL HIGH (ref 70–99)

## 2018-05-10 LAB — MAGNESIUM: Magnesium: 3 mg/dL — ABNORMAL HIGH (ref 1.7–2.4)

## 2018-05-10 MED ORDER — ENOXAPARIN SODIUM 40 MG/0.4ML ~~LOC~~ SOLN
40.0000 mg | SUBCUTANEOUS | Status: DC
Start: 2018-05-10 — End: 2018-05-10

## 2018-05-10 MED ORDER — ENOXAPARIN SODIUM 40 MG/0.4ML ~~LOC~~ SOLN
40.0000 mg | SUBCUTANEOUS | Status: DC
  Administered 2018-05-10 – 2018-05-11 (×2): 40 mg via SUBCUTANEOUS
  Filled 2018-05-10 (×2): qty 0.4

## 2018-05-10 MED ORDER — PANTOPRAZOLE SODIUM 40 MG PO PACK
40.0000 mg | PACK | Freq: Every day | ORAL | Status: DC
  Administered 2018-05-10 – 2018-05-13 (×4): 40 mg
  Filled 2018-05-10 (×5): qty 20

## 2018-05-10 MED ORDER — QUETIAPINE FUMARATE 25 MG PO TABS
50.0000 mg | ORAL_TABLET | Freq: Two times a day (BID) | ORAL | Status: DC
  Administered 2018-05-10: 50 mg
  Filled 2018-05-10: qty 2
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
## 2018-05-10 NOTE — Progress Notes (Signed)
Chaplain made third visit with pt.  Pt's wife bedside, encouraged by pt's progress.  Pt is blinking eyes at prompt and able to squeeze hand.  Pt's wife hopes to receive guidance from CSW.  Provided ministry of presence and will continue to follow. Lynnell Chad Pager [phone]

## 2018-05-10 NOTE — Plan of Care (Signed)
Pt able to wean all AM, will assess extubation pending surgery on Thursday
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
## 2018-05-11 ENCOUNTER — Inpatient Hospital Stay (HOSPITAL_COMMUNITY): Payer: 59

## 2018-05-11 LAB — RENAL FUNCTION PANEL
ANION GAP: 10 (ref 5–15)
Albumin: 1.7 g/dL — ABNORMAL LOW (ref 3.5–5.0)
Albumin: 1.7 g/dL — ABNORMAL LOW (ref 3.5–5.0)
Anion gap: 11 (ref 5–15)
BUN: 40 mg/dL — ABNORMAL HIGH (ref 6–20)
BUN: 40 mg/dL — ABNORMAL HIGH (ref 6–20)
CO2: 22 mmol/L (ref 22–32)
CO2: 22 mmol/L (ref 22–32)
Calcium: 8.2 mg/dL — ABNORMAL LOW (ref 8.9–10.3)
Calcium: 8.4 mg/dL — ABNORMAL LOW (ref 8.9–10.3)
Chloride: 102 mmol/L (ref 98–111)
Chloride: 104 mmol/L (ref 98–111)
Creatinine, Ser: 1.74 mg/dL — ABNORMAL HIGH (ref 0.61–1.24)
Creatinine, Ser: 1.62 mg/dL — ABNORMAL HIGH (ref 0.61–1.24)
GFR calc Af Amer: 56 mL/min — ABNORMAL LOW (ref >60)
GFR calc non Af Amer: 44 mL/min — ABNORMAL LOW (ref >60)
GFR calc non Af Amer: 48 mL/min — ABNORMAL LOW (ref >60)
GFR, EST AFRICAN AMERICAN: 51 mL/min — AB (ref >60)
Glucose, Bld: 180 mg/dL — ABNORMAL HIGH (ref 70–99)
Glucose, Bld: 161 mg/dL — ABNORMAL HIGH (ref 70–99)
Phosphorus: 3.8 mg/dL (ref 2.5–4.6)
Phosphorus: 3.9 mg/dL (ref 2.5–4.6)
Potassium: 4.3 mmol/L (ref 3.5–5.1)
Potassium: 4.2 mmol/L (ref 3.5–5.1)
Sodium: 135 mmol/L (ref 135–145)
Sodium: 136 mmol/L (ref 135–145)

## 2018-05-11 LAB — GLUCOSE, CAPILLARY
GLUCOSE-CAPILLARY: 150 mg/dL — AB (ref 70–99)
GLUCOSE-CAPILLARY: 153 mg/dL — AB (ref 70–99)
Glucose-Capillary: 161 mg/dL — ABNORMAL HIGH (ref 70–99)
Glucose-Capillary: 124 mg/dL — ABNORMAL HIGH (ref 70–99)
Glucose-Capillary: 124 mg/dL — ABNORMAL HIGH (ref 70–99)
Glucose-Capillary: 156 mg/dL — ABNORMAL HIGH (ref 70–99)

## 2018-05-11 LAB — HEPATIC FUNCTION PANEL
ALT: 302 U/L — AB (ref 0–44)
AST: 515 U/L — ABNORMAL HIGH (ref 15–41)
Albumin: 1.7 g/dL — ABNORMAL LOW (ref 3.5–5.0)
Alkaline Phosphatase: 222 U/L — ABNORMAL HIGH (ref 38–126)
Bilirubin, Direct: 25.4 mg/dL — ABNORMAL HIGH (ref 0.0–0.2)
Total Bilirubin: 35.6 mg/dL (ref 0.3–1.2)
Total Protein: 6.5 g/dL (ref 6.5–8.1)

## 2018-05-11 LAB — CBC
HCT: 27.5 % — ABNORMAL LOW (ref 39.0–52.0)
Hemoglobin: 8.8 g/dL — ABNORMAL LOW (ref 13.0–17.0)
MCH: 29.1 pg (ref 26.0–34.0)
MCHC: 32 g/dL (ref 30.0–36.0)
MCV: 91.1 fL (ref 80.0–100.0)
Platelets: 83 10*3/uL — ABNORMAL LOW (ref 150–400)
RBC: 3.02 MIL/uL — AB (ref 4.22–5.81)
RDW: 18.7 % — ABNORMAL HIGH (ref 11.5–15.5)
WBC: 24.5 10*3/uL — ABNORMAL HIGH (ref 4.0–10.5)
nRBC: 21.4 % — ABNORMAL HIGH (ref 0.0–0.2)

## 2018-05-11 LAB — CULTURE, RESPIRATORY W GRAM STAIN
Culture: NORMAL
Special Requests: NORMAL

## 2018-05-11 LAB — PREPARE RBC (CROSSMATCH)

## 2018-05-11 LAB — PATHOLOGIST SMEAR REVIEW

## 2018-05-11 LAB — MAGNESIUM: Magnesium: 3 mg/dL — ABNORMAL HIGH (ref 1.7–2.4)

## 2018-05-11 MED ORDER — SODIUM CHLORIDE 0.9% IV SOLUTION: Freq: Once | INTRAVENOUS | Status: AC

## 2018-05-11 MED ORDER — GUAIFENESIN 100 MG/5ML PO SOLN
5.0000 mL | ORAL | Status: DC | PRN
  Administered 2018-05-11: 100 mg
  Filled 2018-05-11: qty 5
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
## 2018-05-11 NOTE — Progress Notes (Addendum)
Follow up - Trauma Critical Care  Patient Details:    Bruce Henson is an 53 y.o. male.  Lines/tubes : Airway 7.5 mm (Active)  Secured at (cm) 26 cm 05/11/2018  4:14 AM  Measured From Lips 05/11/2018  4:14 AM  Secured Location Left 05/11/2018  4:14 AM  Secured By [employer] 05/11/2018  4:14 AM  Tube Holder Repositioned Yes 05/11/2018  4:14 AM  Cuff Pressure (cm H2O) 26 cm H2O 05/10/2018  7:50 AM  Site Condition Dry 05/11/2018  4:14 AM     CVC Triple Lumen 04/11/2018 Left Internal jugular (Active)  Indication for Insertion or Continuance of Line Vasoactive infusions 05/10/2018  8:00 PM  Site Assessment Clean;Dry;Intact 05/10/2018  8:00 PM  Proximal Lumen Status Infusing 05/10/2018  8:00 PM  Medial Lumen Status In-line blood sampling system in place 05/10/2018  8:00 PM  Distal Lumen Status Infusing 05/10/2018  8:00 PM  Dressing Type Transparent;Occlusive 05/10/2018  8:00 PM  Dressing Status Clean;Dry;Intact;Antimicrobial disc in place 05/10/2018  8:00 PM  Line Care Connections checked and tightened 05/10/2018  8:00 PM  Dressing Intervention Dressing changed;Antimicrobial disc changed 05/08/2018  5:00 AM  Dressing Change Due 05/15/18 05/10/2018  8:00 PM     Arterial Line 04/07/2018 Radial (Active)  Site Assessment Clean;Dry;Intact 05/10/2018  8:00 PM  Line Status Pulsatile blood flow 05/10/2018  8:00 PM  Art Line Waveform Appropriate 05/10/2018  8:00 PM  Art Line Interventions Zeroed and calibrated;Leveled;Connections checked and tightened 05/10/2018  8:00 PM  Color/Movement/Sensation Capillary refill less than 3 sec 05/10/2018  8:00 PM  Dressing Type Transparent;Occlusive 05/10/2018  8:00 PM  Dressing Status Clean;Dry;Intact;Antimicrobial disc in place 05/10/2018  8:00 PM  Dressing Change Due 05/15/18 05/10/2018  8:00 PM     NG/OG Tube Orogastric 18 Fr. Center mouth Confirmed by Surgical Manipulation Documented cm marking at nare/ corner of mouth (Active)  Site Assessment Clean;Dry;Intact 05/10/2018  8:00 PM  Ongoing  Placement Verification No acute changes, not attributed to clinical condition;No change in respiratory status 05/10/2018  8:00 PM  Status Infusing tube feed 05/10/2018  8:00 PM  Amount of suction 90 mmHg 05/08/2018  4:00 AM  Drainage Appearance Bile 05/07/2018  4:00 AM  Output (mL) 0 mL 05/10/2018  7:00 PM     Rectal Tube/Pouch (Active)  Output (mL) 100 mL 05/11/2018  3:00 AM     Urethral Catheter Urology MD 14 Fr. (Active)  Indication for Insertion or Continuance of Catheter Bladder outlet obstruction / other urologic reason 05/10/2018  8:00 PM  Site Assessment Swelling;Bleeding 05/10/2018  8:00 PM  Catheter Maintenance Bag below level of bladder;Catheter secured;Drainage bag/tubing not touching floor;Insertion date on drainage bag;No dependent loops;Seal intact 05/10/2018  8:00 PM  Collection Container Standard drainage bag 05/10/2018  8:00 PM  Securement Method Securing device (Describe) 05/10/2018  8:00 PM  Urinary Catheter Interventions Unclamped 05/10/2018  8:00 PM  Output (mL) 0 mL 05/11/2018  7:00 AM    Microbiology/Sepsis markers: Results for orders placed or performed during the hospital encounter of 04/28/2018  MRSA PCR Screening     Status: None   Collection Time: 04/22/2018  5:03 AM  Result Value Ref Range Status   MRSA by PCR NEGATIVE NEGATIVE Final    Comment:        The GeneXpert MRSA Assay (FDA approved for NASAL specimens only), is one component of a comprehensive MRSA colonization surveillance program. It is not intended to diagnose MRSA infection nor to guide or monitor treatment for MRSA infections.  Performed at [facility], [address] [place], [place] [address]   Surgical pcr screen     Status: None   Collection Time: 05/04/18  3:31 PM  Result Value Ref Range Status   MRSA, PCR NEGATIVE NEGATIVE Final   Staphylococcus aureus NEGATIVE NEGATIVE Final    Comment: (NOTE) The Xpert SA Assay (FDA approved for NASAL specimens in patients 53 years of age and older), is one  component of a comprehensive surveillance program. It is not intended to diagnose infection nor to guide or monitor treatment. Performed at [facility], [address] [place], [place] [address]   Culture, blood (Routine X 2) w Reflex to ID Panel     Status: None (Preliminary result)   Collection Time: 05/08/18  4:16 PM  Result Value Ref Range Status   Specimen Description BLOOD CENTRAL LINE  Final   Special Requests   Final    BOTTLES DRAWN AEROBIC AND ANAEROBIC Blood Culture adequate volume   Culture   Final    NO GROWTH 3 DAYS Performed at [facility], [address] [place], [place] [address]    Report Status PENDING  Incomplete  Culture, blood (Routine X 2) w Reflex to ID Panel     Status: None (Preliminary result)   Collection Time: 05/08/18  7:22 PM  Result Value Ref Range Status   Specimen Description BLOOD BLOOD LEFT HAND  Final   Special Requests IN PEDIATRIC BOTTLE Blood Culture adequate volume  Final   Culture   Final    NO GROWTH 3 DAYS Performed at [facility], [address] [place], [place] [address]    Report Status PENDING  Incomplete  Culture, respiratory (non-expectorated)     Status: None   Collection Time: 05/09/18  8:19 AM  Result Value Ref Range Status   Specimen Description TRACHEAL ASPIRATE  Final   Special Requests Normal  Final   Gram Stain   Final    RARE WBC PRESENT, PREDOMINANTLY PMN RARE GRAM POSITIVE COCCI    Culture   Final    Consistent with normal respiratory flora. Performed at [facility], [address] [place], [place] [address]    Report Status 05/11/2018 FINAL  Final    Anti-infectives:  Anti-infectives (From admission, onward)   Start     Dose/Rate Route Frequency Ordered Stop   05/10/18 1000  vancomycin (VANCOCIN) 1,250 mg in sodium chloride 0.9 % 250 mL IVPB     1,250 mg 166.7 mL/hr over 90 Minutes Intravenous Every 24 hours 05/09/18 0832     05/09/18 1000  vancomycin (VANCOCIN) 2,500 mg in  sodium chloride 0.9 % 500 mL IVPB     2,500 mg 250 mL/hr over 120 Minutes Intravenous  Once 05/09/18 0832 05/09/18 1436   05/09/18 1000  piperacillin-tazobactam (ZOSYN) IVPB 3.375 g     3.375 g 100 mL/hr over 30 Minutes Intravenous Every 6 hours 05/09/18 0832     05/03/18 1200  piperacillin-tazobactam (ZOSYN) IVPB 2.25 g  Status:  Discontinued     2.25 g 100 mL/hr over 30 Minutes Intravenous Every 6 hours 05/03/18 0640 05/08/18 0757   2017-12-18 0600  ceFAZolin (ANCEF) 3 g in dextrose 5 % 50 mL IVPB  Status:  Discontinued     3 g 100 mL/hr over 30 Minutes Intravenous On call to O.R. 2017-12-18 0523 2017-12-18 1300   04/08/2018 2200  piperacillin-tazobactam (ZOSYN) IVPB 3.375 g  Status:  Discontinued  3.375 g 12.5 mL/hr over 4 Hours Intravenous Every 8 hours 04/15/2018 1935 05/03/18 0636   04/20/2018 1930  ceFAZolin (ANCEF) IVPB 2g/100 mL premix     2 g 200 mL/hr over 30 Minutes Intravenous  Once 04/23/2018 1919 10-10-18 [id number]      Best Practice/Protocols:  VTE Prophylaxis: Lovenox (prophylaxtic dose) and Mechanical GI Prophylaxis: Proton Pump Inhibitor Continous Sedation Hyperglycemia (ICU)  Consults: Treatment Team:  Marcine Matarahlstedt, Stephen, MD Karl ItoSommer, Steven E, MD Haddix, Gillie MannersKevin P, MD Maxie BarbBhandari, Dron Prasad, MD Estanislado EmmsFoster, Lori C, MD Sherren KernsFields, Charles E, MD    Studies: EXAM: PORTABLE CHEST 1 VIEW  COMPARISON:  05/09/2018  FINDINGS: Endotracheal tube tip is 6.5 cm above the carina. Nasogastric tube enters the stomach. Left internal jugular central line tip in the SVC at the azygos level. Right subclavian central line tip in the SVC at the azygos level. Mild lower lobe volume loss on the left. Moderate lower lobe volume loss on the right. No significant change. No new finding. Right humeral fracture partially visible.  IMPRESSION: Lines and tubes well positioned. Persistent lower lobe volume loss right worse than left.    Subjective:    Overnight Issues:  Per RN patient did not  rest well overnight after getting seroquel during the day yesterday. Foul smelling stool noted. Some BP issues, still on levo.  Objective:  Vital signs for last 24 hours: Temp:  [97.2 F (36.2 C)-98.7 F (37.1 C)] 98.7 F (37.1 C) (01/08 0400) Pulse Rate:  [78-93] 93 (01/08 0700) Resp:  [12-34] 20 (01/08 0700) BP: (68-125)/(37-86) 105/55 (01/08 0700) SpO2:  [97 %-100 %] 100 % (01/08 0700) Arterial Line BP: (84-179)/(38-72) 140/61 (01/08 0700) FiO2 (%):  [40 %] 40 % (01/08 0414) Weight:  [142 kg] 142 kg (01/08 0500)  Hemodynamic parameters for last 24 hours:    Intake/Output from previous day: 01/07 0701 - 01/08 0700 In: 1444.3 [I.V.:324.1; NG/GT:670.3; IV Piggyback:449.9] Out: 1467 [Urine:2; Stool:125]  Intake/Output this shift: No intake/output data recorded.  Vent settings for last 24 hours: Vent Mode: PRVC FiO2 (%):  [40 %] 40 % Set Rate:  [17 bmp-30 bmp] 30 bmp Vt Set:  [500 mL] 500 mL PEEP:  [5 cmH20] 5 cmH20 Pressure Support:  [5 cmH20] 5 cmH20 Plateau Pressure:  [19 cmH20-20 cmH20] 20 cmH20  Physical Exam:  General: sedated on the vent Neuro: not following commands for me this AM, tracking some HEENT/Neck: ETT and sclera icteric, cervical collar in place Resp: diminished breath sounds bilaterally CVS: regular rate and rhythm, S1, S2 normal, no murmur, click, rub or gallop GI: abdomen soft, no involuntary gaurding, incision c/d/i with staples present Skin: no rash Extremities: ex-fix present to pelvis and RLE with ss drainage; laceration to L shin c/d/i with sutures present; toes necrotic (L worse than R); blisters present on hands bilaterally  GU: scrotum edematous, foley present with small amount dark brown urine  Results for orders placed or performed during the hospital encounter of 04/22/2018 (from the past 24 hour(s))  Glucose, capillary     Status: Abnormal   Collection Time: 05/10/18 11:34 AM  Result Value Ref Range   Glucose-Capillary 128 (H) 70 - 99  mg/dL   Comment 1 Notify RN    Comment 2 Document in Chart   Glucose, capillary     Status: Abnormal   Collection Time: 05/10/18  3:35 PM  Result Value Ref Range   Glucose-Capillary 161 (H) 70 - 99 mg/dL   Comment 1 Notify RN  Comment 2 Document in Chart   Renal function panel (daily at 1600)     Status: Abnormal   Collection Time: 05/10/18  4:19 PM  Result Value Ref Range   Sodium 136 135 - 145 mmol/L   Potassium 4.7 3.5 - 5.1 mmol/L   Chloride 102 98 - 111 mmol/L   CO2 22 22 - 32 mmol/L   Glucose, Bld 175 (H) 70 - 99 mg/dL   BUN 41 (H) 6 - 20 mg/dL   Creatinine, Ser 8.11 (H) 0.61 - 1.24 mg/dL   Calcium 8.1 (L) 8.9 - 10.3 mg/dL   Phosphorus 3.9 2.5 - 4.6 mg/dL   Albumin 1.7 (L) 3.5 - 5.0 g/dL   GFR calc non Af Amer 40 (L) >60 mL/min   GFR calc Af Amer 47 (L) >60 mL/min   Anion gap 12 5 - 15  Glucose, capillary     Status: Abnormal   Collection Time: 05/10/18  7:32 PM  Result Value Ref Range   Glucose-Capillary 159 (H) 70 - 99 mg/dL  Glucose, capillary     Status: Abnormal   Collection Time: 05/10/18 11:16 PM  Result Value Ref Range   Glucose-Capillary 158 (H) 70 - 99 mg/dL  Glucose, capillary     Status: Abnormal   Collection Time: 05/11/18  3:18 AM  Result Value Ref Range   Glucose-Capillary 161 (H) 70 - 99 mg/dL  Renal function panel (daily at 0500)     Status: Abnormal   Collection Time: 05/11/18  5:00 AM  Result Value Ref Range   Sodium 135 135 - 145 mmol/L   Potassium 4.3 3.5 - 5.1 mmol/L   Chloride 102 98 - 111 mmol/L   CO2 22 22 - 32 mmol/L   Glucose, Bld 180 (H) 70 - 99 mg/dL   BUN 40 (H) 6 - 20 mg/dL   Creatinine, Ser 9.14 (H) 0.61 - 1.24 mg/dL   Calcium 8.2 (L) 8.9 - 10.3 mg/dL   Phosphorus 3.8 2.5 - 4.6 mg/dL   Albumin 1.7 (L) 3.5 - 5.0 g/dL   GFR calc non Af Amer 44 (L) >60 mL/min   GFR calc Af Amer 51 (L) >60 mL/min   Anion gap 11 5 - 15  Magnesium     Status: Abnormal   Collection Time: 05/11/18  5:00 AM  Result Value Ref Range   Magnesium 3.0  (H) 1.7 - 2.4 mg/dL  CBC     Status: Abnormal   Collection Time: 05/11/18  5:00 AM  Result Value Ref Range   WBC 24.5 (H) 4.0 - 10.5 K/uL   RBC 3.02 (L) 4.22 - 5.81 MIL/uL   Hemoglobin 8.8 (L) 13.0 - 17.0 g/dL   HCT 78.2 (L) 95.6 - 21.3 %   MCV 91.1 80.0 - 100.0 fL   MCH 29.1 26.0 - 34.0 pg   MCHC 32.0 30.0 - 36.0 g/dL   RDW 08.6 (H) 57.8 - 46.9 %   Platelets 83 (L) 150 - 400 K/uL   nRBC 21.4 (H) 0.0 - 0.2 %  Hepatic function panel     Status: Abnormal   Collection Time: 05/11/18  5:00 AM  Result Value Ref Range   Total Protein 6.5 6.5 - 8.1 g/dL   Albumin 1.7 (L) 3.5 - 5.0 g/dL   AST 629 (H) 15 - 41 U/L   ALT 302 (H) 0 - 44 U/L   Alkaline Phosphatase 222 (H) 38 - 126 U/L   Total Bilirubin 35.6 (HH) 0.3 - 1.2 mg/dL  Bilirubin, Direct 25.4 (H) 0.0 - 0.2 mg/dL   Indirect Bilirubin NOT CALCULATED 0.3 - 0.9 mg/dL  Glucose, capillary     Status: Abnormal   Collection Time: 05/11/18  7:25 AM  Result Value Ref Range   Glucose-Capillary 150 (H) 70 - 99 mg/dL   Comment 1 Notify RN    Comment 2 Document in Chart     Assessment & Plan:   LOS: 10 days   Additional comments:I reviewed patient's labs and imaging studies, will discuss with attending provider. [facility] Open book pelvic FX- S/P ex fix and angioembolization, ORIF delayed 1/7 due to WBC - tentatively planning for tomorrow 1/9 Acute hypoxic ventilator dependent respiratory failure- weaning well on 5/5, add guaifenesin to help thin secretions  ABL anemia- hgb trending down 8.8, plts down to 83K from 107K - transfuse 1 unit PRBC  Abdominal compartment syndrome- S/P ex lap and packing 12/29 by Dr. Dwain Sarna, S/P ex lap and removal of packs 12/30 Dr. Sheliah Hatch, S/P ex lap and closure 1/3 by Dr. Janee Morn Ischemic toes B - appreciate Dr. Darrick Penna consult and he will F/U. Try to limit pressors. AKI- CRRT per Renal - appreciate their assist T5 endplate and lumbar TVP FXs - no treatment needed per Dr. Newell Coral R femur FX- Dr.  Jena Gauss plans surgery later this week as above Hyperbilirubinemia- due to reabsorbtion of hematoma, GB U/S OK, continue to follow  CV- on small dose levoduring CRRT FEN- hold TF after MN tonight for possible OR tomorrow VTE- Lovenox ID - Vanc/Zosyn empiric, WBC 24 from 33, 1/5 blood CX neg so far, 1/6 resp CX P - grew normal respiratory flora Dispo- ICU Critical Care Total Time*: 30 Minutes  Wells Guiles , [facility] Surgery 05/11/2018, 7:50 AM Pager: [phone]   05/11/2018  *Care during the described time interval was provided by me. I have reviewed this patient's available data, including medical history, events of note, physical examination and test results as part of my evaluation.

## 2018-05-12 ENCOUNTER — Inpatient Hospital Stay (HOSPITAL_COMMUNITY): Payer: 59

## 2018-05-12 ENCOUNTER — Inpatient Hospital Stay (HOSPITAL_COMMUNITY): Payer: 59 | Admitting: Certified Registered"

## 2018-05-12 ENCOUNTER — Encounter (HOSPITAL_COMMUNITY): Payer: Self-pay | Admitting: Certified Registered"

## 2018-05-12 ENCOUNTER — Encounter (HOSPITAL_COMMUNITY): Admission: EM | Disposition: E | Payer: Self-pay | Source: Home / Self Care

## 2018-05-12 HISTORY — PX: ORIF PELVIC FRACTURE: SHX2128

## 2018-05-12 HISTORY — PX: FEMUR IM NAIL: SHX1597

## 2018-05-12 LAB — RENAL FUNCTION PANEL
Albumin: 2.1 g/dL — ABNORMAL LOW (ref 3.5–5.0)
Albumin: 2 g/dL — ABNORMAL LOW (ref 3.5–5.0)
Anion gap: 12 (ref 5–15)
Anion gap: 24 — ABNORMAL HIGH (ref 5–15)
BUN: 50 mg/dL — ABNORMAL HIGH (ref 6–20)
BUN: 42 mg/dL — ABNORMAL HIGH (ref 6–20)
CHLORIDE: 106 mmol/L (ref 98–111)
CO2: 19 mmol/L — ABNORMAL LOW (ref 22–32)
CO2: 8 mmol/L — ABNORMAL LOW (ref 22–32)
Calcium: 8 mg/dL — ABNORMAL LOW (ref 8.9–10.3)
Calcium: 8 mg/dL — ABNORMAL LOW (ref 8.9–10.3)
Chloride: 104 mmol/L (ref 98–111)
Creatinine, Ser: 2.33 mg/dL — ABNORMAL HIGH (ref 0.61–1.24)
Creatinine, Ser: 1.92 mg/dL — ABNORMAL HIGH (ref 0.61–1.24)
GFR calc Af Amer: 36 mL/min — ABNORMAL LOW (ref >60)
GFR calc Af Amer: 45 mL/min — ABNORMAL LOW (ref >60)
GFR calc non Af Amer: 31 mL/min — ABNORMAL LOW (ref >60)
GFR calc non Af Amer: 39 mL/min — ABNORMAL LOW (ref >60)
Glucose, Bld: 139 mg/dL — ABNORMAL HIGH (ref 70–99)
Glucose, Bld: 42 mg/dL — CL (ref 70–99)
Phosphorus: 6.9 mg/dL — ABNORMAL HIGH (ref 2.5–4.6)
Phosphorus: 10.3 mg/dL — ABNORMAL HIGH (ref 2.5–4.6)
Potassium: 5.5 mmol/L — ABNORMAL HIGH (ref 3.5–5.1)
Potassium: 6.6 mmol/L (ref 3.5–5.1)
SODIUM: 135 mmol/L (ref 135–145)
Sodium: 138 mmol/L (ref 135–145)

## 2018-05-12 LAB — CBC
HCT: 22.4 % — ABNORMAL LOW (ref 39.0–52.0)
HEMATOCRIT: 29.5 % — AB (ref 39.0–52.0)
Hemoglobin: 9.4 g/dL — ABNORMAL LOW (ref 13.0–17.0)
Hemoglobin: 6.9 g/dL — CL (ref 13.0–17.0)
MCH: 28.7 pg (ref 26.0–34.0)
MCH: 30.8 pg (ref 26.0–34.0)
MCHC: 31.9 g/dL (ref 30.0–36.0)
MCHC: 30.8 g/dL (ref 30.0–36.0)
MCV: 90.2 fL (ref 80.0–100.0)
MCV: 100 fL (ref 80.0–100.0)
Platelets: 53 10*3/uL — ABNORMAL LOW (ref 150–400)
Platelets: 82 10*3/uL — ABNORMAL LOW (ref 150–400)
RBC: 3.27 MIL/uL — ABNORMAL LOW (ref 4.22–5.81)
RBC: 2.24 MIL/uL — ABNORMAL LOW (ref 4.22–5.81)
RDW: 19.9 % — ABNORMAL HIGH (ref 11.5–15.5)
RDW: 21.1 % — ABNORMAL HIGH (ref 11.5–15.5)
WBC: 15.1 10*3/uL — ABNORMAL HIGH (ref 4.0–10.5)
WBC: 20.1 10*3/uL — ABNORMAL HIGH (ref 4.0–10.5)
nRBC: 18.4 % — ABNORMAL HIGH (ref 0.0–0.2)
nRBC: 34.9 % — ABNORMAL HIGH (ref 0.0–0.2)

## 2018-05-12 LAB — GLUCOSE, CAPILLARY
GLUCOSE-CAPILLARY: 110 mg/dL — AB (ref 70–99)
GLUCOSE-CAPILLARY: 126 mg/dL — AB (ref 70–99)
Glucose-Capillary: 122 mg/dL — ABNORMAL HIGH (ref 70–99)
Glucose-Capillary: 84 mg/dL (ref 70–99)
Glucose-Capillary: 96 mg/dL (ref 70–99)
Glucose-Capillary: 87 mg/dL (ref 70–99)
Glucose-Capillary: 41 mg/dL — CL (ref 70–99)

## 2018-05-12 LAB — POCT I-STAT 3, ART BLOOD GAS (G3+)
Acid-base deficit: 19 mmol/L — ABNORMAL HIGH (ref 0.0–2.0)
Bicarbonate: 8.3 mmol/L — ABNORMAL LOW (ref 20.0–28.0)
O2 Saturation: 99 %
PO2 ART: 164 mmHg — AB (ref 83.0–108.0)
Patient temperature: 96.8
TCO2: 9 mmol/L — ABNORMAL LOW (ref 22–32)
pCO2 arterial: 24.7 mmHg — ABNORMAL LOW (ref 32.0–48.0)
pH, Arterial: 7.129 — CL (ref 7.350–7.450)

## 2018-05-12 LAB — PHOSPHORUS: Phosphorus: 3.7 mg/dL (ref 2.5–4.6)

## 2018-05-12 LAB — POCT I-STAT, CHEM 8
BUN: 47 mg/dL — ABNORMAL HIGH (ref 6–20)
Calcium, Ion: 0.9 mmol/L — ABNORMAL LOW (ref 1.15–1.40)
Chloride: 108 mmol/L (ref 98–111)
Creatinine, Ser: 2.8 mg/dL — ABNORMAL HIGH (ref 0.61–1.24)
Glucose, Bld: 80 mg/dL (ref 70–99)
HCT: 21 % — ABNORMAL LOW (ref 39.0–52.0)
Hemoglobin: 7.1 g/dL — ABNORMAL LOW (ref 13.0–17.0)
Potassium: 5.9 mmol/L — ABNORMAL HIGH (ref 3.5–5.1)
Sodium: 136 mmol/L (ref 135–145)
TCO2: 14 mmol/L — AB (ref 22–32)

## 2018-05-12 LAB — LACTIC ACID, PLASMA: Lactic Acid, Venous: 14.4 mmol/L (ref 0.5–1.9)

## 2018-05-12 LAB — MAGNESIUM: Magnesium: 2.8 mg/dL — ABNORMAL HIGH (ref 1.7–2.4)

## 2018-05-12 LAB — PREPARE RBC (CROSSMATCH)

## 2018-05-12 SURGERY — OPEN REDUCTION INTERNAL FIXATION (ORIF) PELVIC FRACTURE
Anesthesia: General | Laterality: Right

## 2018-05-12 MED ORDER — VANCOMYCIN HCL 1000 MG IV SOLR
INTRAVENOUS | Status: DC | PRN
  Administered 2018-05-12 (×2): 1000 mg via TOPICAL

## 2018-05-12 MED ORDER — SODIUM CHLORIDE 0.9% IV SOLUTION
Freq: Once | INTRAVENOUS | Status: AC
  Administered 2018-05-12: 08:00:00 via INTRAVENOUS

## 2018-05-12 MED ORDER — CALCIUM GLUCONATE-NACL 1-0.675 GM/50ML-% IV SOLN
1.0000 g | Freq: Once | INTRAVENOUS | Status: AC
  Administered 2018-05-12: 1000 mg via INTRAVENOUS
  Filled 2018-05-12: qty 50

## 2018-05-12 MED ORDER — SODIUM CHLORIDE 0.9 % IV SOLN
INTRAVENOUS | Status: DC | PRN
  Administered 2018-05-12: 10:00:00 via INTRAVENOUS

## 2018-05-12 MED ORDER — PIVOT 1.5 CAL PO LIQD
1000.0000 mL | ORAL | Status: DC
  Administered 2018-05-12: 1000 mL

## 2018-05-12 MED ORDER — SODIUM CHLORIDE 0.9 % IV SOLN
INTRAVENOUS | Status: DC | PRN
  Administered 2018-05-12: 250 ug/h via INTRAVENOUS

## 2018-05-12 MED ORDER — PRISMASOL BGK 0/2.5 32-2.5 MEQ/L IV SOLN
INTRAVENOUS | Status: DC
  Administered 2018-05-12 – 2018-05-14 (×12): via INTRAVENOUS_CENTRAL
  Filled 2018-05-12 (×17): qty 5000

## 2018-05-12 MED ORDER — CISATRACURIUM BESYLATE 20 MG/10ML IV SOLN
INTRAVENOUS | Status: AC
  Filled 2018-05-12: qty 20

## 2018-05-12 MED ORDER — PHENYLEPHRINE HCL-NACL 40-0.9 MG/250ML-% IV SOLN
0.0000 ug/min | INTRAVENOUS | Status: DC
  Administered 2018-05-12 – 2018-05-13 (×7): 400 ug/min via INTRAVENOUS
  Administered 2018-05-13: 225 ug/min via INTRAVENOUS
  Administered 2018-05-13 – 2018-05-14 (×19): 400 ug/min via INTRAVENOUS
  Filled 2018-05-12 (×30): qty 250

## 2018-05-12 MED ORDER — CALCIUM CHLORIDE 10 % IV SOLN
INTRAVENOUS | Status: DC | PRN
  Administered 2018-05-12: 250 mg via INTRAVENOUS

## 2018-05-12 MED ORDER — VASOPRESSIN 20 UNIT/ML IV SOLN
INTRAVENOUS | Status: DC | PRN
  Administered 2018-05-12 (×2): 1 [IU] via INTRAVENOUS
  Administered 2018-05-12: .5 [IU] via INTRAVENOUS
  Administered 2018-05-12 (×7): 1 [IU] via INTRAVENOUS
  Administered 2018-05-12: .5 [IU] via INTRAVENOUS
  Administered 2018-05-12 (×2): 1 [IU] via INTRAVENOUS

## 2018-05-12 MED ORDER — DEXTROSE 50 % IV SOLN
1.0000 | Freq: Once | INTRAVENOUS | Status: AC
  Administered 2018-05-12: 50 mL via INTRAVENOUS

## 2018-05-12 MED ORDER — VANCOMYCIN HCL 1000 MG IV SOLR
INTRAVENOUS | Status: AC
  Filled 2018-05-12: qty 2000

## 2018-05-12 MED ORDER — TOBRAMYCIN SULFATE 1.2 G IJ SOLR
INTRAMUSCULAR | Status: DC | PRN
  Administered 2018-05-12: 1.2 g via TOPICAL

## 2018-05-12 MED ORDER — ALBUMIN HUMAN 5 % IV SOLN
INTRAVENOUS | Status: DC | PRN
  Administered 2018-05-12 (×3): via INTRAVENOUS

## 2018-05-12 MED ORDER — SODIUM CHLORIDE 0.9% IV SOLUTION
Freq: Once | INTRAVENOUS | Status: AC
  Administered 2018-05-12: 21:00:00 via INTRAVENOUS

## 2018-05-12 MED ORDER — CISATRACURIUM BESYLATE (PF) 10 MG/5ML IV SOLN
INTRAVENOUS | Status: DC | PRN
  Administered 2018-05-12: 20 mg via INTRAVENOUS
  Administered 2018-05-12: 10 mg via INTRAVENOUS
  Administered 2018-05-12 (×3): 20 mg via INTRAVENOUS
  Administered 2018-05-12 (×2): 10 mg via INTRAVENOUS

## 2018-05-12 MED ORDER — EPINEPHRINE PF 1 MG/ML IJ SOLN
0.5000 ug/min | INTRAVENOUS | Status: DC
  Administered 2018-05-13 (×2): 20 ug/min via INTRAVENOUS
  Administered 2018-05-13: 1 ug/min via INTRAVENOUS
  Administered 2018-05-13 – 2018-05-14 (×8): 20 ug/min via INTRAVENOUS
  Filled 2018-05-12 (×15): qty 4

## 2018-05-12 MED ORDER — BACITRACIN ZINC 500 UNIT/GM EX OINT
TOPICAL_OINTMENT | CUTANEOUS | Status: AC
  Filled 2018-05-12: qty 28.35

## 2018-05-12 MED ORDER — SODIUM CHLORIDE 0.9 % IV SOLN
INTRAVENOUS | Status: DC | PRN
  Administered 2018-05-12: 40 ug/min via INTRAVENOUS
  Administered 2018-05-12: 60 ug/min via INTRAVENOUS

## 2018-05-12 MED ORDER — VASOPRESSIN 20 UNIT/ML IV SOLN
0.0300 [IU]/min | INTRAVENOUS | Status: DC
  Administered 2018-05-12 – 2018-05-14 (×4): 0.03 [IU]/min via INTRAVENOUS
  Filled 2018-05-12 (×4): qty 2

## 2018-05-12 MED ORDER — VASOPRESSIN 20 UNIT/ML IV SOLN: INTRAVENOUS | Status: DC | PRN

## 2018-05-12 MED ORDER — SODIUM CHLORIDE 0.9 % IV SOLN
INTRAVENOUS | Status: DC
  Administered 2018-05-12: 21:00:00 via INTRAVENOUS

## 2018-05-12 MED ORDER — MIDAZOLAM HCL 5 MG/5ML IJ SOLN
INTRAMUSCULAR | Status: DC | PRN
  Administered 2018-05-12: 2 mg via INTRAVENOUS

## 2018-05-12 MED ORDER — TOBRAMYCIN SULFATE 1.2 G IJ SOLR
INTRAMUSCULAR | Status: AC
  Filled 2018-05-12: qty 2.4

## 2018-05-12 MED ORDER — FENTANYL CITRATE (PF) 250 MCG/5ML IJ SOLN
INTRAMUSCULAR | Status: AC
  Filled 2018-05-12: qty 5

## 2018-05-12 MED ORDER — SODIUM CHLORIDE 0.9% IV SOLUTION
Freq: Once | INTRAVENOUS | Status: AC
  Administered 2018-05-12: 23:00:00 via INTRAVENOUS

## 2018-05-12 MED ORDER — SODIUM CHLORIDE 0.9 % IV SOLN
INTRAVENOUS | Status: DC | PRN
  Administered 2018-05-12: 11:00:00 via INTRAVENOUS

## 2018-05-12 MED ORDER — DEXAMETHASONE SODIUM PHOSPHATE 10 MG/ML IJ SOLN
INTRAMUSCULAR | Status: DC | PRN
  Administered 2018-05-12: 5 mg via INTRAVENOUS

## 2018-05-12 MED ORDER — PHENYLEPHRINE HCL-NACL 10-0.9 MG/250ML-% IV SOLN
INTRAVENOUS | Status: AC
  Administered 2018-05-12: 400 ug/min
  Filled 2018-05-12: qty 250

## 2018-05-12 MED ORDER — DEXTROSE 50 % IV SOLN
INTRAVENOUS | Status: AC
  Filled 2018-05-12: qty 50

## 2018-05-12 MED ORDER — PHENYLEPHRINE 40 MCG/ML (10ML) SYRINGE FOR IV PUSH (FOR BLOOD PRESSURE SUPPORT)
PREFILLED_SYRINGE | INTRAVENOUS | Status: DC | PRN
  Administered 2018-05-12: 120 ug via INTRAVENOUS
  Administered 2018-05-12: 80 ug via INTRAVENOUS
  Administered 2018-05-12: 120 ug via INTRAVENOUS
  Administered 2018-05-12 (×2): 80 ug via INTRAVENOUS

## 2018-05-12 MED ORDER — NOREPINEPHRINE BITARTRATE 1 MG/ML IV SOLN
INTRAVENOUS | Status: DC | PRN
  Administered 2018-05-12: 6 ug/min via INTRAVENOUS

## 2018-05-12 MED ORDER — PROPOFOL 10 MG/ML IV BOLUS
INTRAVENOUS | Status: AC
  Filled 2018-05-12: qty 20

## 2018-05-12 MED ORDER — 0.9 % SODIUM CHLORIDE (POUR BTL) OPTIME
TOPICAL | Status: DC | PRN
  Administered 2018-05-12: 1000 mL

## 2018-05-12 MED ORDER — DEXAMETHASONE SODIUM PHOSPHATE 10 MG/ML IJ SOLN
INTRAMUSCULAR | Status: AC
  Filled 2018-05-12: qty 2

## 2018-05-12 MED ORDER — CALCIUM CHLORIDE 10 % IV SOLN
INTRAVENOUS | Status: AC
  Filled 2018-05-12: qty 10

## 2018-05-12 MED ORDER — VANCOMYCIN HCL 1000 MG IV SOLR
INTRAVENOUS | Status: DC | PRN
  Administered 2018-05-12: 1250 mg via INTRAVENOUS

## 2018-05-12 MED ORDER — PHENYLEPHRINE HCL 10 MG/ML IJ SOLN
INTRAMUSCULAR | Status: AC
  Filled 2018-05-12: qty 1

## 2018-05-12 MED ORDER — MIDAZOLAM HCL 2 MG/2ML IJ SOLN
INTRAMUSCULAR | Status: AC
  Filled 2018-05-12: qty 2

## 2018-05-12 SURGICAL SUPPLY — 123 items
BANDAGE ACE 4X5 VEL STRL LF (GAUZE/BANDAGES/DRESSINGS) ×6 IMPLANT
BANDAGE ACE 6X5 VEL STRL LF (GAUZE/BANDAGES/DRESSINGS) ×4 IMPLANT
BANDAGE ELASTIC 4 VELCRO ST LF (GAUZE/BANDAGES/DRESSINGS) ×2 IMPLANT
BIT DRILL 2.5X300 (BIT) IMPLANT
BIT DRILL 4.2 (DRILL) IMPLANT
BIT DRILL CALIBRATED 4.2 (BIT) IMPLANT
BIT DRILL CANN 4.5MM (BIT) IMPLANT
BIT DRILL SHORT 4.2 (BIT) IMPLANT
BLADE CLIPPER SURG (BLADE) ×2 IMPLANT
BLADE SURG 10 STRL SS (BLADE) ×4 IMPLANT
BLADE SURG 11 STRL SS (BLADE) ×2 IMPLANT
BLADE SURG 15 STRL LF DISP TIS (BLADE) IMPLANT
BLADE SURG 15 STRL SS (BLADE) ×4
BNDG COHESIVE 4X5 TAN STRL (GAUZE/BANDAGES/DRESSINGS) ×4 IMPLANT
BNDG ELASTIC 6X10 VLCR STRL LF (GAUZE/BANDAGES/DRESSINGS) ×4 IMPLANT
BNDG GAUZE ELAST 4 BULKY (GAUZE/BANDAGES/DRESSINGS) ×4 IMPLANT
BRUSH SCRUB SURG 4.25 DISP (MISCELLANEOUS) ×8 IMPLANT
CHLORAPREP W/TINT 26ML (MISCELLANEOUS) ×4 IMPLANT
COVER MAYO STAND STRL (DRAPES) ×4 IMPLANT
COVER SURGICAL LIGHT HANDLE (MISCELLANEOUS) ×8 IMPLANT
COVER WAND RF STERILE (DRAPES) ×4 IMPLANT
DERMABOND ADVANCED (GAUZE/BANDAGES/DRESSINGS) ×4
DERMABOND ADVANCED .7 DNX12 (GAUZE/BANDAGES/DRESSINGS) ×4 IMPLANT
DRAIN CHANNEL 15F RND FF W/TCR (WOUND CARE) IMPLANT
DRAPE C-ARM 42X72 X-RAY (DRAPES) ×6 IMPLANT
DRAPE C-ARMOR (DRAPES) ×4 IMPLANT
DRAPE HALF SHEET 40X57 (DRAPES) ×2 IMPLANT
DRAPE IMP U-DRAPE 54X76 (DRAPES) ×8 IMPLANT
DRAPE INCISE IOBAN 66X45 STRL (DRAPES) ×8 IMPLANT
DRAPE ORTHO SPLIT 77X108 STRL (DRAPES) ×4
DRAPE PROXIMA HALF (DRAPES) ×8 IMPLANT
DRAPE SURG 17X23 STRL (DRAPES) ×24 IMPLANT
DRAPE SURG ORHT 6 SPLT 77X108 (DRAPES) ×4 IMPLANT
DRAPE U-SHAPE 47X51 STRL (DRAPES) ×4 IMPLANT
DRAPE UNIVERSAL PACK (DRAPES) ×4 IMPLANT
DRILL 4.2 (DRILL) ×4
DRILL BIT 2.5X300 (BIT) ×4
DRILL BIT CALIBRATED 4.2 (BIT) ×4
DRILL BIT CANN 4.5MM (BIT) ×4
DRILL BIT SHORT 4.2 (BIT) ×2
DRSG ADAPTIC 3X8 NADH LF (GAUZE/BANDAGES/DRESSINGS) ×2 IMPLANT
DRSG MEPILEX BORDER 4X4 (GAUZE/BANDAGES/DRESSINGS) ×10 IMPLANT
DRSG MEPILEX BORDER 4X8 (GAUZE/BANDAGES/DRESSINGS) ×6 IMPLANT
DRSG MEPITEL 4X7.2 (GAUZE/BANDAGES/DRESSINGS) ×10 IMPLANT
DRSG PAD ABDOMINAL 8X10 ST (GAUZE/BANDAGES/DRESSINGS) ×10 IMPLANT
ELECT BLADE 4.0 EZ CLEAN MEGAD (MISCELLANEOUS) ×4
ELECT REM PT RETURN 9FT ADLT (ELECTROSURGICAL) ×4
ELECTRODE BLDE 4.0 EZ CLN MEGD (MISCELLANEOUS) IMPLANT
ELECTRODE REM PT RTRN 9FT ADLT (ELECTROSURGICAL) ×2 IMPLANT
EVACUATOR SILICONE 100CC (DRAIN) IMPLANT
GAUZE SPONGE 4X4 12PLY STRL (GAUZE/BANDAGES/DRESSINGS) ×4 IMPLANT
GAUZE SPONGE 4X4 12PLY STRL LF (GAUZE/BANDAGES/DRESSINGS) ×10 IMPLANT
GLOVE BIO SURGEON STRL SZ 6.5 (GLOVE) ×9 IMPLANT
GLOVE BIO SURGEON STRL SZ7.5 (GLOVE) ×16 IMPLANT
GLOVE BIO SURGEONS STRL SZ 6.5 (GLOVE) ×3
GLOVE BIOGEL PI IND STRL 6.5 (GLOVE) ×2 IMPLANT
GLOVE BIOGEL PI IND STRL 7.5 (GLOVE) ×2 IMPLANT
GLOVE BIOGEL PI INDICATOR 6.5 (GLOVE) ×2
GLOVE BIOGEL PI INDICATOR 7.5 (GLOVE) ×2
GOWN STRL REUS W/ TWL LRG LVL3 (GOWN DISPOSABLE) ×4 IMPLANT
GOWN STRL REUS W/TWL LRG LVL3 (GOWN DISPOSABLE) ×4
GUIDEWIRE 2.0MM (WIRE) ×8 IMPLANT
GUIDEWIRE 3.2X400 (WIRE) ×2 IMPLANT
GUIDEWIRE THREADED 2.8MM (WIRE) ×6 IMPLANT
HANDPIECE INTERPULSE COAX TIP (DISPOSABLE) ×2
KIT BASIN OR (CUSTOM PROCEDURE TRAY) ×4 IMPLANT
KIT TURNOVER KIT B (KITS) ×4 IMPLANT
MANIFOLD NEPTUNE II (INSTRUMENTS) ×4 IMPLANT
NAIL FEM RETRO TI 10X420 (Nail) ×2 IMPLANT
NS IRRIG 1000ML POUR BTL (IV SOLUTION) ×8 IMPLANT
PACK ORTHO EXTREMITY (CUSTOM PROCEDURE TRAY) ×4 IMPLANT
PACK TOTAL JOINT (CUSTOM PROCEDURE TRAY) ×4 IMPLANT
PAD ARMBOARD 7.5X6 YLW CONV (MISCELLANEOUS) ×8 IMPLANT
PAD CAST 4YDX4 CTTN HI CHSV (CAST SUPPLIES) IMPLANT
PADDING CAST COTTON 4X4 STRL (CAST SUPPLIES) ×4
PLATE PUBLIC SYMPHOSIS 3.5 (Plate) ×2 IMPLANT
REAMER ROD DEEP FLUTE 2.5X950 (INSTRUMENTS) ×4 IMPLANT
SCREW BONE CANN 7.3X70 (Screw) ×2 IMPLANT
SCREW CANN 32 THRD/100 7.3 (Screw) ×2 IMPLANT
SCREW CANN 32 THRD/70 7.3 (Screw) ×2 IMPLANT
SCREW CANN LOCK FT 7.3X160 (Screw) ×2 IMPLANT
SCREW CORTEX 3.5 30MM (Screw) ×2 IMPLANT
SCREW CORTEX 3.5 38MM (Screw) ×2 IMPLANT
SCREW CORTEX 3.5X45MM (Screw) ×2 IMPLANT
SCREW CORTEX 3.5X50MM (Screw) ×2 IMPLANT
SCREW LOCK CORT ST 3.5X30 (Screw) IMPLANT
SCREW LOCK CORT ST 3.5X38 (Screw) IMPLANT
SCREW LOCK STAR 5X38 (Screw) ×4 IMPLANT
SCREW LOCK STAR 5X42 (Screw) ×2 IMPLANT
SCREW LOCK STAR 5X48 (Screw) ×2 IMPLANT
SCREW LOCK STAR 5X62 (Screw) ×2 IMPLANT
SCREW LOCK STAR 5X76 (Screw) ×2 IMPLANT
SCREW LOCK T25 FT 85X5X4.3X (Screw) IMPLANT
SCREW LOCKING  5.0X85MM (Screw) ×2 IMPLANT
SCREW PELVIC CORT ST 3.5X50 (Screw) IMPLANT
SCREW PELVIC CORT ST 3.5X60 (Screw) IMPLANT
SCREW SELF TAP 3.5 60M (Screw) ×2 IMPLANT
SET HNDPC FAN SPRY TIP SCT (DISPOSABLE) IMPLANT
SPONGE LAP 18X18 X RAY DECT (DISPOSABLE) ×4 IMPLANT
STAPLER VISISTAT 35W (STAPLE) ×4 IMPLANT
SUCTION FRAZIER HANDLE 10FR (MISCELLANEOUS) ×2
SUCTION TUBE FRAZIER 10FR DISP (MISCELLANEOUS) ×2 IMPLANT
SUT ETHILON 2 0 PSLX (SUTURE) ×2 IMPLANT
SUT ETHILON 3 0 PS 1 (SUTURE) ×10 IMPLANT
SUT MNCRL AB 3-0 PS2 18 (SUTURE) ×4 IMPLANT
SUT MON AB 2-0 CT1 36 (SUTURE) ×8 IMPLANT
SUT PDS AB 1 CT  36 (SUTURE) ×2
SUT PDS AB 1 CT 36 (SUTURE) IMPLANT
SUT VIC AB 0 CT1 27 (SUTURE) ×10
SUT VIC AB 0 CT1 27XBRD ANBCTR (SUTURE) ×4 IMPLANT
SUT VIC AB 1 CT1 18XCR BRD 8 (SUTURE) IMPLANT
SUT VIC AB 1 CT1 8-18 (SUTURE)
SUT VIC AB 2-0 CT1 27 (SUTURE) ×4
SUT VIC AB 2-0 CT1 TAPERPNT 27 (SUTURE) ×4 IMPLANT
TAPE CLOTH SURG 4X10 WHT LF (GAUZE/BANDAGES/DRESSINGS) ×4 IMPLANT
TOWEL OR 17X24 6PK STRL BLUE (TOWEL DISPOSABLE) ×4 IMPLANT
TOWEL OR 17X26 10 PK STRL BLUE (TOWEL DISPOSABLE) ×8 IMPLANT
TRAY FOLEY MTR SLVR 16FR STAT (SET/KITS/TRAYS/PACK) IMPLANT
TUBE CONNECTING 12'X1/4 (SUCTIONS) ×1
TUBE CONNECTING 12X1/4 (SUCTIONS) ×3 IMPLANT
WASHER FOR 5.0 SCREWS (Washer) ×6 IMPLANT
WATER STERILE IRR 1000ML POUR (IV SOLUTION) ×16 IMPLANT
YANKAUER SUCT BULB TIP NO VENT (SUCTIONS) ×4 IMPLANT
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
## 2018-05-12 NOTE — OR Nursing (Signed)
First call to 4N that patient should return in approximately 45 more minutes.
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
## 2018-05-12 NOTE — Progress Notes (Addendum)
Alerted by nurse earlier that pt was hypoTN with systolic in 60-70s. Levophed was maxed. Vasopressin was at 0.03. pt had been in surgery earlier today and on Neo during surgery. No oxy/ventilation issues currentl - good sats, min vent settings.   -confirmed no fluid being taken off with CRRT -check CVP - 11-->8 -istat hgb was 7 - ordered 1u blood -start Neo gtt -also ordered 1L NS -check renal panel, cbc, coags, lactate, abg after 1u prbc given -check cxr  Pt seemed to respond to blood and bolus Labs - K 6.6 - nurse already spoke with renal and they are making changes Hgb 7 - give 1 more unit given pressor need -suspicion for fat emboli/PE low  Mary Sella. Andrey Campanile, MD, FACS General, Bariatric, & Minimally Invasive Surgery [facility], [place]
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
## 2018-05-12 NOTE — Transfer of Care (Signed)
Immediate Anesthesia Transfer of Care Note  Patient: Armando Gang  Procedure(s) Performed: OPEN REDUCTION INTERNAL FIXATION (ORIF) PELVIC FRACTURE (Bilateral ) INTRAMEDULLARY (IM) RETROGRADE FEMORAL NAILING (Right )  Patient Location: ICU  Anesthesia Type:General  Level of Consciousness: Patient remains intubated per anesthesia plan  Airway & Oxygen Therapy: Patient remains intubated per anesthesia plan and Patient placed on Ventilator (see vital sign flow sheet for setting)  Post-op Assessment: Report given to RN and Post -op Vital signs reviewed and stable  Post vital signs: Reviewed and stable  Last Vitals:  Vitals Value Taken Time  BP 134/82 1527  Temp    Pulse 93 Jun 08, 2018  3:27 PM  Resp 22 June 08, 2018  3:27 PM  SpO2 97 % 06/08/18  3:27 PM  Vitals shown include unvalidated device data.  Last Pain:  Vitals:   08-Jun-2018 0900  TempSrc: Axillary         Complications: No apparent anesthesia complications
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
## 2018-05-13 ENCOUNTER — Inpatient Hospital Stay (HOSPITAL_COMMUNITY): Payer: 59

## 2018-05-13 LAB — BASIC METABOLIC PANEL
BUN: 35 mg/dL — ABNORMAL HIGH (ref 6–20)
CO2: <7 mmol/L — ABNORMAL LOW (ref 22–32)
CREATININE: 1.69 mg/dL — AB (ref 0.61–1.24)
Calcium: 7 mg/dL — ABNORMAL LOW (ref 8.9–10.3)
Chloride: 101 mmol/L (ref 98–111)
GFR calc Af Amer: 53 mL/min — ABNORMAL LOW (ref >60)
GFR calc non Af Amer: 46 mL/min — ABNORMAL LOW (ref >60)
Glucose, Bld: 99 mg/dL (ref 70–99)
Potassium: 6.1 mmol/L — ABNORMAL HIGH (ref 3.5–5.1)
Sodium: 139 mmol/L (ref 135–145)

## 2018-05-13 LAB — POCT I-STAT 3, ART BLOOD GAS (G3+)
Acid-base deficit: 24 mmol/L — ABNORMAL HIGH (ref 0.0–2.0)
Acid-base deficit: 25 mmol/L — ABNORMAL HIGH (ref 0.0–2.0)
Acid-base deficit: 22 mmol/L — ABNORMAL HIGH (ref 0.0–2.0)
Bicarbonate: 6.3 mmol/L — ABNORMAL LOW (ref 20.0–28.0)
Bicarbonate: 6.1 mmol/L — ABNORMAL LOW (ref 20.0–28.0)
Bicarbonate: 7.5 mmol/L — ABNORMAL LOW (ref 20.0–28.0)
O2 Saturation: 100 %
O2 Saturation: 94 %
O2 Saturation: 97 %
Patient temperature: 35
Patient temperature: 98.5
Patient temperature: 98.5
TCO2: 7 mmol/L — ABNORMAL LOW (ref 22–32)
TCO2: 7 mmol/L — ABNORMAL LOW (ref 22–32)
TCO2: 8 mmol/L — ABNORMAL LOW (ref 22–32)
pCO2 arterial: 26.2 mmHg — ABNORMAL LOW (ref 32.0–48.0)
pCO2 arterial: 30.5 mmHg — ABNORMAL LOW (ref 32.0–48.0)
pCO2 arterial: 27.9 mmHg — ABNORMAL LOW (ref 32.0–48.0)
pH, Arterial: 6.976 — CL (ref 7.350–7.450)
pH, Arterial: 6.908 — CL (ref 7.350–7.450)
pH, Arterial: 7.036 — CL (ref 7.350–7.450)
pO2, Arterial: 394 mmHg — ABNORMAL HIGH (ref 83.0–108.0)
pO2, Arterial: 115 mmHg — ABNORMAL HIGH (ref 83.0–108.0)
pO2, Arterial: 126 mmHg — ABNORMAL HIGH (ref 83.0–108.0)

## 2018-05-13 LAB — POCT I-STAT 7, (LYTES, BLD GAS, ICA,H+H)
Acid-base deficit: 3 mmol/L — ABNORMAL HIGH (ref 0.0–2.0)
Acid-base deficit: 3 mmol/L — ABNORMAL HIGH (ref 0.0–2.0)
Bicarbonate: 22.9 mmol/L (ref 20.0–28.0)
Bicarbonate: 22.5 mmol/L (ref 20.0–28.0)
Calcium, Ion: 1.01 mmol/L — ABNORMAL LOW (ref 1.15–1.40)
Calcium, Ion: 0.99 mmol/L — ABNORMAL LOW (ref 1.15–1.40)
HCT: 26 % — ABNORMAL LOW (ref 39.0–52.0)
HCT: 24 % — ABNORMAL LOW (ref 39.0–52.0)
Hemoglobin: 8.8 g/dL — ABNORMAL LOW (ref 13.0–17.0)
Hemoglobin: 8.2 g/dL — ABNORMAL LOW (ref 13.0–17.0)
O2 Saturation: 97 %
O2 Saturation: 99 %
PO2 ART: 99 mmHg (ref 83.0–108.0)
Patient temperature: 36.8
Potassium: 4.6 mmol/L (ref 3.5–5.1)
Potassium: 4.7 mmol/L (ref 3.5–5.1)
Sodium: 137 mmol/L (ref 135–145)
Sodium: 137 mmol/L (ref 135–145)
TCO2: 24 mmol/L (ref 22–32)
TCO2: 24 mmol/L (ref 22–32)
pCO2 arterial: 41.5 mmHg (ref 32.0–48.0)
pCO2 arterial: 39 mmHg (ref 32.0–48.0)
pH, Arterial: 7.348 — ABNORMAL LOW (ref 7.350–7.450)
pH, Arterial: 7.368 (ref 7.350–7.450)
pO2, Arterial: 127 mmHg — ABNORMAL HIGH (ref 83.0–108.0)

## 2018-05-13 LAB — CBC WITH DIFFERENTIAL/PLATELET
Abs Immature Granulocytes: 2 10*3/uL — ABNORMAL HIGH (ref 0.00–0.07)
Band Neutrophils: 1 %
Basophils Absolute: 0.3 10*3/uL — ABNORMAL HIGH (ref 0.0–0.1)
Basophils Relative: 1 %
Eosinophils Absolute: 0.3 10*3/uL (ref 0.0–0.5)
Eosinophils Relative: 1 %
HCT: 25.5 % — ABNORMAL LOW (ref 39.0–52.0)
Hemoglobin: 7.5 g/dL — ABNORMAL LOW (ref 13.0–17.0)
Lymphocytes Relative: 12 %
Lymphs Abs: 3 10*3/uL (ref 0.7–4.0)
MCH: 29.6 pg (ref 26.0–34.0)
MCHC: 29.4 g/dL — AB (ref 30.0–36.0)
MCV: 100.8 fL — ABNORMAL HIGH (ref 80.0–100.0)
METAMYELOCYTES PCT: 3 %
Monocytes Absolute: 2.5 10*3/uL — ABNORMAL HIGH (ref 0.1–1.0)
Monocytes Relative: 10 %
Myelocytes: 5 %
NEUTROS ABS: 17.3 10*3/uL — AB (ref 1.7–7.7)
NEUTROS PCT: 67 %
Platelets: 85 10*3/uL — ABNORMAL LOW (ref 150–400)
RBC: 2.53 MIL/uL — ABNORMAL LOW (ref 4.22–5.81)
RDW: 20 % — ABNORMAL HIGH (ref 11.5–15.5)
WBC: 25.4 10*3/uL — ABNORMAL HIGH (ref 4.0–10.5)
nRBC: 45 % — ABNORMAL HIGH (ref 0.0–0.2)

## 2018-05-13 LAB — CBC
HCT: 30.4 % — ABNORMAL LOW (ref 39.0–52.0)
Hemoglobin: 8.9 g/dL — ABNORMAL LOW (ref 13.0–17.0)
MCH: 28.3 pg (ref 26.0–34.0)
MCHC: 29.3 g/dL — ABNORMAL LOW (ref 30.0–36.0)
MCV: 96.5 fL (ref 80.0–100.0)
Platelets: 61 10*3/uL — ABNORMAL LOW (ref 150–400)
RBC: 3.15 MIL/uL — ABNORMAL LOW (ref 4.22–5.81)
RDW: 19.5 % — ABNORMAL HIGH (ref 11.5–15.5)
WBC: 25.4 10*3/uL — ABNORMAL HIGH (ref 4.0–10.5)
nRBC: 25.5 % — ABNORMAL HIGH (ref 0.0–0.2)

## 2018-05-13 LAB — RENAL FUNCTION PANEL
ALBUMIN: 1.7 g/dL — AB (ref 3.5–5.0)
Albumin: 1.7 g/dL — ABNORMAL LOW (ref 3.5–5.0)
Anion gap: 33 — ABNORMAL HIGH (ref 5–15)
BUN: 38 mg/dL — AB (ref 6–20)
BUN: 32 mg/dL — ABNORMAL HIGH (ref 6–20)
CO2: 7 mmol/L — ABNORMAL LOW (ref 22–32)
Calcium: 7.3 mg/dL — ABNORMAL LOW (ref 8.9–10.3)
Calcium: 6.7 mg/dL — ABNORMAL LOW (ref 8.9–10.3)
Chloride: 104 mmol/L (ref 98–111)
Chloride: 98 mmol/L (ref 98–111)
Creatinine, Ser: 1.99 mg/dL — ABNORMAL HIGH (ref 0.61–1.24)
Creatinine, Ser: 1.75 mg/dL — ABNORMAL HIGH (ref 0.61–1.24)
GFR calc Af Amer: 43 mL/min — ABNORMAL LOW (ref >60)
GFR calc Af Amer: 51 mL/min — ABNORMAL LOW (ref >60)
GFR calc non Af Amer: 37 mL/min — ABNORMAL LOW (ref >60)
GFR calc non Af Amer: 44 mL/min — ABNORMAL LOW (ref >60)
Glucose, Bld: 197 mg/dL — ABNORMAL HIGH (ref 70–99)
Glucose, Bld: 122 mg/dL — ABNORMAL HIGH (ref 70–99)
PHOSPHORUS: 11.1 mg/dL — AB (ref 2.5–4.6)
Phosphorus: 11.7 mg/dL — ABNORMAL HIGH (ref 2.5–4.6)
Potassium: 6.2 mmol/L — ABNORMAL HIGH (ref 3.5–5.1)
Potassium: 5.8 mmol/L — ABNORMAL HIGH (ref 3.5–5.1)
Sodium: 136 mmol/L (ref 135–145)
Sodium: 138 mmol/L (ref 135–145)

## 2018-05-13 LAB — COMPREHENSIVE METABOLIC PANEL
ALT: 238 U/L — ABNORMAL HIGH (ref 0–44)
AST: 419 U/L — ABNORMAL HIGH (ref 15–41)
Albumin: 1.7 g/dL — ABNORMAL LOW (ref 3.5–5.0)
Alkaline Phosphatase: 181 U/L — ABNORMAL HIGH (ref 38–126)
Anion gap: 11 (ref 5–15)
BUN: 39 mg/dL — ABNORMAL HIGH (ref 6–20)
CO2: 22 mmol/L (ref 22–32)
Calcium: 8.2 mg/dL — ABNORMAL LOW (ref 8.9–10.3)
Chloride: 103 mmol/L (ref 98–111)
Creatinine, Ser: 1.5 mg/dL — ABNORMAL HIGH (ref 0.61–1.24)
GFR calc Af Amer: >60 mL/min (ref >60)
GFR calc non Af Amer: 53 mL/min — ABNORMAL LOW (ref >60)
Glucose, Bld: 131 mg/dL — ABNORMAL HIGH (ref 70–99)
Potassium: 4.2 mmol/L (ref 3.5–5.1)
Sodium: 136 mmol/L (ref 135–145)
Total Bilirubin: 46.8 mg/dL (ref 0.3–1.2)
Total Protein: 6.1 g/dL — ABNORMAL LOW (ref 6.5–8.1)

## 2018-05-13 LAB — BPAM PLATELET PHERESIS
BLOOD PRODUCT EXPIRATION DATE: 202001092359
Blood Product Expiration Date: 202001102359
ISSUE DATE / TIME: 202001090733
ISSUE DATE / TIME: 202001091231
Unit Type and Rh: 5100
Unit Type and Rh: 6200

## 2018-05-13 LAB — CULTURE, BLOOD (ROUTINE X 2)
Culture: NO GROWTH
Culture: NO GROWTH
Special Requests: ADEQUATE
Special Requests: ADEQUATE

## 2018-05-13 LAB — POCT I-STAT 4, (NA,K, GLUC, HGB,HCT)
Glucose, Bld: 91 mg/dL (ref 70–99)
HCT: 25 % — ABNORMAL LOW (ref 39.0–52.0)
Hemoglobin: 8.5 g/dL — ABNORMAL LOW (ref 13.0–17.0)
Potassium: 4.6 mmol/L (ref 3.5–5.1)
Sodium: 137 mmol/L (ref 135–145)

## 2018-05-13 LAB — GLUCOSE, CAPILLARY
GLUCOSE-CAPILLARY: 101 mg/dL — AB (ref 70–99)
GLUCOSE-CAPILLARY: 68 mg/dL — AB (ref 70–99)
Glucose-Capillary: 114 mg/dL — ABNORMAL HIGH (ref 70–99)
Glucose-Capillary: 118 mg/dL — ABNORMAL HIGH (ref 70–99)
Glucose-Capillary: 119 mg/dL — ABNORMAL HIGH (ref 70–99)
Glucose-Capillary: 125 mg/dL — ABNORMAL HIGH (ref 70–99)
Glucose-Capillary: 140 mg/dL — ABNORMAL HIGH (ref 70–99)
Glucose-Capillary: 154 mg/dL — ABNORMAL HIGH (ref 70–99)
Glucose-Capillary: 156 mg/dL — ABNORMAL HIGH (ref 70–99)
Glucose-Capillary: 65 mg/dL — ABNORMAL LOW (ref 70–99)
Glucose-Capillary: 94 mg/dL (ref 70–99)

## 2018-05-13 LAB — FIBRINOGEN: Fibrinogen: 657 mg/dL — ABNORMAL HIGH (ref 210–475)

## 2018-05-13 LAB — HEPATIC FUNCTION PANEL
ALT: 245 U/L — ABNORMAL HIGH (ref 0–44)
AST: 735 U/L — ABNORMAL HIGH (ref 15–41)
Albumin: 1.9 g/dL — ABNORMAL LOW (ref 3.5–5.0)
Alkaline Phosphatase: 225 U/L — ABNORMAL HIGH (ref 38–126)
BILIRUBIN DIRECT: 26.4 mg/dL — AB (ref 0.0–0.2)
BILIRUBIN INDIRECT: 11.3 mg/dL — AB (ref 0.3–0.9)
Total Bilirubin: 37.7 mg/dL (ref 0.3–1.2)
Total Protein: 5 g/dL — ABNORMAL LOW (ref 6.5–8.1)

## 2018-05-13 LAB — PREPARE RBC (CROSSMATCH)

## 2018-05-13 LAB — PROTIME-INR
INR: 2.37
Prothrombin Time: 25.6 seconds — ABNORMAL HIGH (ref 11.4–15.2)

## 2018-05-13 LAB — PREPARE PLATELET PHERESIS
UNIT DIVISION: 0
Unit division: 0

## 2018-05-13 LAB — MAGNESIUM: Magnesium: 2.9 mg/dL — ABNORMAL HIGH (ref 1.7–2.4)

## 2018-05-13 LAB — APTT: aPTT: 32 seconds (ref 24–36)

## 2018-05-13 LAB — TROPONIN I
Troponin I: 0.21 ng/mL (ref <0.03)
Troponin I: 0.19 ng/mL (ref <0.03)
Troponin I: 0.24 ng/mL (ref <0.03)
Troponin I: 0.25 ng/mL (ref <0.03)

## 2018-05-13 LAB — AMMONIA: Ammonia: 154 umol/L — ABNORMAL HIGH (ref 9–35)

## 2018-05-13 LAB — LACTIC ACID, PLASMA: Lactic Acid, Venous: 20.4 mmol/L (ref 0.5–1.9)

## 2018-05-13 LAB — POTASSIUM: Potassium: 6.1 mmol/L — ABNORMAL HIGH (ref 3.5–5.1)

## 2018-05-13 LAB — VANCOMYCIN, RANDOM: Vancomycin Rm: 19

## 2018-05-13 MED ORDER — DEXTROSE 50 % IV SOLN
1.0000 | Freq: Once | INTRAVENOUS | Status: AC
  Administered 2018-05-13: 50 mL via INTRAVENOUS

## 2018-05-13 MED ORDER — PRISMASOL BGK 0/2.5 32-2.5 MEQ/L REPLACEMENT SOLN
Status: DC
  Filled 2018-05-13: qty 5000

## 2018-05-13 MED ORDER — SODIUM BICARBONATE 8.4 % IV SOLN
INTRAVENOUS | Status: AC
  Administered 2018-05-13: 50 meq
  Filled 2018-05-13: qty 50

## 2018-05-13 MED ORDER — SODIUM CHLORIDE 0.9% IV SOLUTION
Freq: Once | INTRAVENOUS | Status: AC
  Administered 2018-05-13: 08:00:00 via INTRAVENOUS

## 2018-05-13 MED ORDER — DEXTROSE 50 % IV SOLN
INTRAVENOUS | Status: AC
  Administered 2018-05-13: 50 mL via INTRAVENOUS
  Filled 2018-05-13: qty 50

## 2018-05-13 MED ORDER — SODIUM BICARBONATE 8.4 % IV SOLN
100.0000 meq | Freq: Once | INTRAVENOUS | Status: AC
  Administered 2018-05-13: 100 meq via INTRAVENOUS
  Filled 2018-05-13: qty 50

## 2018-05-13 MED ORDER — SODIUM CHLORIDE 0.9% IV SOLUTION: Freq: Once | INTRAVENOUS | Status: AC

## 2018-05-13 MED ORDER — SODIUM CHLORIDE 0.9 % IV BOLUS
1000.0000 mL | Freq: Once | INTRAVENOUS | Status: AC
  Administered 2018-05-13: 1000 mL via INTRAVENOUS

## 2018-05-13 MED ORDER — SODIUM BICARBONATE 8.4 % IV SOLN
INTRAVENOUS | Status: AC
  Administered 2018-05-13: 50 meq
  Filled 2018-05-13: qty 200

## 2018-05-13 MED ORDER — SODIUM BICARBONATE 8.4 % IV SOLN
100.0000 meq | Freq: Once | INTRAVENOUS | Status: AC
  Administered 2018-05-13: 100 meq via INTRAVENOUS

## 2018-05-13 MED ORDER — LACTULOSE 10 GM/15ML PO SOLN
30.0000 g | Freq: Once | ORAL | Status: AC
  Administered 2018-05-13: 30 g
  Filled 2018-05-13: qty 45

## 2018-05-13 MED ORDER — HYDROCORTISONE NA SUCCINATE PF 100 MG IJ SOLR
100.0000 mg | Freq: Three times a day (TID) | INTRAMUSCULAR | Status: DC
  Administered 2018-05-13 – 2018-05-14 (×5): 100 mg via INTRAVENOUS
  Filled 2018-05-13 (×5): qty 2

## 2018-05-13 MED ORDER — PRISMASOL BGK 0/2.5 32-2.5 MEQ/L IV SOLN
INTRAVENOUS | Status: DC
  Administered 2018-05-13 – 2018-05-14 (×3): via INTRAVENOUS_CENTRAL
  Filled 2018-05-13 (×4): qty 5000

## 2018-05-13 MED ORDER — SODIUM BICARBONATE 8.4 % IV SOLN
INTRAVENOUS | Status: DC
  Administered 2018-05-13 – 2018-05-14 (×6): via INTRAVENOUS
  Filled 2018-05-13 (×9): qty 150

## 2018-05-13 MED ORDER — DEXTROSE 50 % IV SOLN
INTRAVENOUS | Status: AC
  Filled 2018-05-13: qty 50

## 2018-05-13 MED ORDER — SODIUM BICARBONATE 8.4 % IV SOLN
50.0000 meq | Freq: Once | INTRAVENOUS | Status: AC
  Administered 2018-05-13: 50 meq via INTRAVENOUS

## 2018-05-13 MED ORDER — PRISMASOL BGK 4/2.5 32-4-2.5 MEQ/L REPLACEMENT SOLN
Status: DC
  Administered 2018-05-13 (×2): via INTRAVENOUS_CENTRAL
  Filled 2018-05-13 (×3): qty 5000

## 2018-05-13 MED ORDER — SODIUM BICARBONATE 8.4 % IV SOLN
INTRAVENOUS | Status: AC
  Filled 2018-05-13: qty 100

## 2018-05-13 MED ORDER — SODIUM CHLORIDE 0.9 % IV BOLUS
500.0000 mL | Freq: Once | INTRAVENOUS | Status: AC
  Administered 2018-05-13: 500 mL via INTRAVENOUS

## 2018-05-13 MED ORDER — CALCIUM GLUCONATE-NACL 1-0.675 GM/50ML-% IV SOLN
1.0000 g | Freq: Once | INTRAVENOUS | Status: AC
  Administered 2018-05-13: 1000 mg via INTRAVENOUS
  Filled 2018-05-13: qty 50

## 2018-05-13 NOTE — Progress Notes (Signed)
Chaplain provided support while Dr. Kendrick Fries spoke with patient.  She is trying to prepare spiritually and emotionally for the worse.  A cousin came to visit.  Chaplain will return later today. Lynnell Chad Pager [phone]
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
## 2018-05-13 NOTE — Progress Notes (Signed)
FMLA and disability paperwork signed by MD, completed, and faxed to [employer].  Returned originals to pt's wife at bedside.  Wife is appreciative of assistance.    Quintella Baton, RN, BSN  Trauma/Neuro ICU Case Manager [phone]

## 2018-05-13 NOTE — Anesthesia Postprocedure Evaluation (Signed)
Anesthesia Post Note  Patient: Bruce Henson  Procedure(s) Performed: OPEN REDUCTION INTERNAL FIXATION (ORIF) PELVIC FRACTURE (Bilateral ) INTRAMEDULLARY (IM) RETROGRADE FEMORAL NAILING (Right )     Patient location during evaluation: ICU Anesthesia Type: General Level of consciousness: sedated and patient remains intubated per anesthesia plan Pain management: pain level controlled Vital Signs Assessment: vitals unstable Respiratory status: patient on ventilator - see flowsheet for VS and patient remains intubated per anesthesia plan Cardiovascular status: unstable (requiring multiple pressors, very acidotic) Postop Assessment: no apparent nausea or vomiting (very unstable POD 1) Anesthetic complications: no Comments: Trauma and Critical care services managing this critically ill pt    Last Vitals:  Vitals:   05/13/18 0915 05/13/18 1114  BP: (!) 71/23 (!) 82/45  Pulse: 98   Resp: (!) 22 (!) 30  Temp: (!) 36.4 C   SpO2: 93% 96%    Last Pain:  Vitals:   05/13/18 0845  TempSrc: Esophageal                 Bruce Henson,Bruce Henson

## 2018-05-13 NOTE — Progress Notes (Signed)
ABG ordered and obtained on patient this AM.  Sample obtained on ventilator settings of tidal volume of 500, respiratory rate of 30, PEEP of 5, and FIO2 of 40%.  Results given to MD.  2 amps of bicarb ordered for patient, RN aware.  Will continue to monitor.    Ref. Range 05/13/2018 09:44  Sample type Unknown ARTERIAL  pH, Arterial Latest Ref Range: 7.350 - 7.450  6.908 (LL)  pCO2 arterial Latest Ref Range: 32.0 - 48.0 mmHg 30.5 (L)  pO2, Arterial Latest Ref Range: 83.0 - 108.0 mmHg 115.0 (H)  TCO2 Latest Ref Range: 22 - 32 mmol/L 7 (L)  Acid-base deficit Latest Ref Range: 0.0 - 2.0 mmol/L 25.0 (H)  Bicarbonate Latest Ref Range: 20.0 - 28.0 mmol/L 6.1 (L)  O2 Saturation Latest Units: % 94.0  Patient temperature Unknown 98.5 F  Collection site Unknown ARTERIAL LINE

## 2018-05-13 NOTE — Progress Notes (Signed)
eLink Physician-Brief Progress Note Patient Name: Bruce Henson DOB: Oct 19, 1965 MRN: [id number]   Date of Service  05/13/2018  HPI/Events of Note  Pt responded well to blood and IVFs and was able to restart CRRT but is again hypotensive into the 60s.   Lactate 14.4 7.129/24/164/8 on mech vent settings  Pt also hypoglycemic requiring 1 am of D50 and upon recheck was again hypoglycemic.  eICU Interventions  Give 500cc bolus NS. Start on HCO3 gtt in D5 at 29ml/hr. If unresponsive to fluids, would start epi gtt.     Intervention Category Major Interventions: Acid-Base disturbance - evaluation and management;Hypotension - evaluation and management  Larinda Buttery 05/13/2018, 1:14 AM
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
## 2018-05-13 NOTE — Progress Notes (Signed)
Patient ID: Bruce Henson, male   DOB: October 13, 1965, 53 y.o.   MRN: [id number] Lactulose for elevated NH4 Appreciate help from CCM I updated his wife.  Violeta Gelinas, MD, MPH, FACS Trauma: [phone] General Surgery: [phone]
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
## 2018-05-13 NOTE — Progress Notes (Signed)
[facility]  Subjective  - sedated on vent   Objective (!) 85/43 93 (!) 97.3 F (36.3 C) (!) 40 96%  Intake/Output Summary (Last 24 hours) at 05/13/2018 1541 Last data filed at 05/13/2018 1500 Gross per 24 hour  Intake 10050.24 ml  Output 1409 ml  Net 8641.24 ml   Toes more dessicated Right side still fairly boggy but no ongoing worsening from several days ago  Assessment/Planning: Gangrene toes bilaterally will address when more stable Wean pressors if tolerated I am away next week.  My partners are covering if questions.  Fabienne Bruns 05/13/2018 3:41 PM --  Laboratory Lab Results: Recent Labs    05/13/18 0300 05/13/18 1356  WBC 25.4* 25.4*  HGB 7.5* 8.9*  HCT 25.5* 30.4*  PLT 85* 61*   BMET Recent Labs    05/13/18 0300 05/13/18 0731  NA 136 139  K 6.2* 6.1*  CL 104 101  CO2 <7* <7*  GLUCOSE 197* 99  BUN 38* 35*  CREATININE 1.99* 1.69*  CALCIUM 7.3* 7.0*    COAG Lab Results  Component Value Date   INR 2.37 05/13/2018   INR 1.14 05/08/2018   INR 1.35 04/12/2018   No results found for: PTT

## 2018-05-13 NOTE — Progress Notes (Signed)
CRITICAL VALUE ALERT  Critical Value:  Lactic acid 20.4  Date & Time Notied:  05/13/2018 @ 1600  Provider Notified: Dr. Janee Morn  Orders Received/Actions taken: None

## 2018-05-13 NOTE — Progress Notes (Addendum)
Follow up - Trauma Critical Care  Patient Details:    Bruce Henson is an 53 y.o. male.  Lines/tubes : Airway 7.5 mm (Active)  Secured at (cm) 26 cm 05/13/2018  3:10 AM  Measured From Lips 05/13/2018  3:10 AM  Secured Location Left 05/13/2018  3:10 AM  Secured By [employer] 05/13/2018  3:10 AM  Tube Holder Repositioned Yes 05/13/2018  3:10 AM  Cuff Pressure (cm H2O) 30 cm H2O 05/17/2018  7:09 PM  Site Condition Dry 05/21/2018  8:00 PM     CVC Triple Lumen 04/08/2018 Left Internal jugular (Active)  Indication for Insertion or Continuance of Line Vasoactive infusions 05/26/2018 10:00 PM  Site Assessment Clean;Intact;Dry 06/01/2018 10:00 PM  Proximal Lumen Status Infusing 05/15/2018 10:00 PM  Medial Lumen Status Infusing 05/13/2018 10:00 PM  Distal Lumen Status Infusing;In-line blood sampling system in place;Blood return noted 05/28/2018 10:00 PM  Dressing Type Transparent;Occlusive 06/02/2018 10:00 PM  Dressing Status Clean;Dry;Intact;Antimicrobial disc in place 05/16/2018 10:00 PM  Line Care Zeroed and calibrated 05/13/2018 10:00 PM  Dressing Intervention Dressing changed;Antimicrobial disc changed 05/08/2018  5:00 AM  Dressing Change Due 05/15/18 05/13/2018 10:00 PM     Arterial Line 04/21/2018 Radial (Active)  Site Assessment Clean;Dry;Intact 05/29/2018 10:00 PM  Line Status Pulsatile blood flow 05/23/2018 10:00 PM  Art Line Waveform Appropriate 05/20/2018 10:00 PM  Art Line Interventions Zeroed and calibrated;Leveled;Connections checked and tightened;Flushed per protocol 05/31/2018 10:00 PM  Color/Movement/Sensation Capillary refill less than 3 sec 05/04/2018 10:00 PM  Dressing Type Transparent;Occlusive 05/18/2018 10:00 PM  Dressing Status Clean;Dry;Intact;Antimicrobial disc in place 05/15/2018 10:00 PM  Dressing Change Due 05/15/18 05/22/2018 10:00 PM     NG/OG Tube Orogastric 16 Fr. Right mouth Xray (Active)  Site Assessment Clean;Dry;Intact 05/29/2018  8:00 PM  Ongoing Placement Verification No change in cm  markings or external length of tube from initial placement;No change in respiratory status;No acute changes, not attributed to clinical condition;Xray 05/07/2018  8:00 PM  Status Infusing tube feed 05/23/2018  8:00 PM  Intake (mL) 50 mL 05/11/2018 10:19 PM  Output (mL) 0 mL 05/11/2018  6:00 PM     Rectal Tube/Pouch (Active)  Output (mL) 0 mL 05/04/2018  8:00 PM     Urethral Catheter Urology MD 14 Fr. (Active)  Indication for Insertion or Continuance of Catheter Bladder outlet obstruction / other urologic reason;Peri-operative use for selective surgical procedure 05/22/2018  8:00 PM  Site Assessment Swelling;Edema 05/19/2018  8:00 PM  Catheter Maintenance Bag below level of bladder;Catheter secured;Drainage bag/tubing not touching floor;Insertion date on drainage bag;No dependent loops;Bag emptied prior to transport 05/30/2018  8:00 PM  Collection Container Standard drainage bag 05/20/2018  8:00 PM  Securement Method Securing device (Describe) 05/11/2018  8:00 PM  Urinary Catheter Interventions Unclamped 05/31/2018  8:00 PM  Output (mL) 0 mL 05/13/2018  7:00 AM    Microbiology/Sepsis markers: Results for orders placed or performed during the hospital encounter of 04/10/2018  MRSA PCR Screening     Status: None   Collection Time: 04/06/2018  5:03 AM  Result Value Ref Range Status   MRSA by PCR NEGATIVE NEGATIVE Final    Comment:        The GeneXpert MRSA Assay (FDA approved for NASAL specimens only), is one component of a comprehensive MRSA colonization surveillance program. It is not intended to diagnose MRSA infection nor to guide or monitor treatment for MRSA infections. Performed at [facility], [address], [place], [place] [address]  Surgical pcr screen     Status: None   Collection Time: 05/04/18  3:31 PM  Result Value Ref Range Status   MRSA, PCR NEGATIVE NEGATIVE Final   Staphylococcus aureus NEGATIVE NEGATIVE Final    Comment: (NOTE) The Xpert SA Assay (FDA approved for NASAL  specimens in patients 66 years of age and older), is one component of a comprehensive surveillance program. It is not intended to diagnose infection nor to guide or monitor treatment. Performed at [facility], [address] [place], [place] [address]   Culture, blood (Routine X 2) w Reflex to ID Panel     Status: None   Collection Time: 05/08/18  4:16 PM  Result Value Ref Range Status   Specimen Description BLOOD CENTRAL LINE  Final   Special Requests   Final    BOTTLES DRAWN AEROBIC AND ANAEROBIC Blood Culture adequate volume   Culture   Final    NO GROWTH 5 DAYS Performed at [facility], [address], [place], [place] [address]    Report Status 05/13/2018 FINAL  Final  Culture, blood (Routine X 2) w Reflex to ID Panel     Status: None   Collection Time: 05/08/18  7:22 PM  Result Value Ref Range Status   Specimen Description BLOOD BLOOD LEFT HAND  Final   Special Requests IN PEDIATRIC BOTTLE Blood Culture adequate volume  Final   Culture   Final    NO GROWTH 5 DAYS Performed at [facility], [address], [place], [place] [address]    Report Status 05/13/2018 FINAL  Final  Culture, respiratory (non-expectorated)     Status: None   Collection Time: 05/09/18  8:19 AM  Result Value Ref Range Status   Specimen Description TRACHEAL ASPIRATE  Final   Special Requests Normal  Final   Gram Stain   Final    RARE WBC PRESENT, PREDOMINANTLY PMN RARE GRAM POSITIVE COCCI    Culture   Final    Consistent with normal respiratory flora. Performed at [facility], [address] [place], [place] [address]    Report Status 05/11/2018 FINAL  Final    Anti-infectives:  Anti-infectives (From admission, onward)   Start     Dose/Rate Route Frequency Ordered Stop   05/09/2018 1407  vancomycin (VANCOCIN) powder  Status:  Discontinued       As needed 05/21/2018 1407 06/02/2018 1515   05/13/2018 1407  tobramycin (NEBCIN) powder  Status:  Discontinued       As needed  05/13/2018 1407 05/15/2018 1515   05/10/18 1000  vancomycin (VANCOCIN) 1,250 mg in sodium chloride 0.9 % 250 mL IVPB     1,250 mg 166.7 mL/hr over 90 Minutes Intravenous Every 24 hours 05/09/18 0832     05/09/18 1000  vancomycin (VANCOCIN) 2,500 mg in sodium chloride 0.9 % 500 mL IVPB     2,500 mg 250 mL/hr over 120 Minutes Intravenous  Once 05/09/18 0832 05/09/18 1436   05/09/18 1000  piperacillin-tazobactam (ZOSYN) IVPB 3.375 g     3.375 g 100 mL/hr over 30 Minutes Intravenous Every 6 hours 05/09/18 0832     05/03/18 1200  piperacillin-tazobactam (ZOSYN) IVPB 2.25 g  Status:  Discontinued     2.25 g 100 mL/hr over 30 Minutes Intravenous Every 6 hours 05/03/18 0640 05/08/18 0757   05-20-2018 0600  ceFAZolin (ANCEF) 3 g in dextrose 5 % 50 mL IVPB  Status:  Discontinued  3 g 100 mL/hr over 30 Minutes Intravenous On call to O.R. March 20, 2018 0523 March 20, 2018 1300   04/06/2018 2200  piperacillin-tazobactam (ZOSYN) IVPB 3.375 g  Status:  Discontinued     3.375 g 12.5 mL/hr over 4 Hours Intravenous Every 8 hours 04/04/2018 1935 05/03/18 0636   04/11/2018 1930  ceFAZolin (ANCEF) IVPB 2g/100 mL premix     2 g 200 mL/hr over 30 Minutes Intravenous  Once 04/09/2018 1919 05/26/2018 [id number]      Best Practice/Protocols:  VTE Prophylaxis: Mechanical Continous Sedation  Consults: Treatment Team:  Marcine Matarahlstedt, Stephen, MD Karl ItoSommer, Steven E, MD Haddix, Gillie MannersKevin P, MD Maxie BarbBhandari, Dron Prasad, MD Estanislado EmmsFoster, Lori C, MD Sherren KernsFields, Charles E, MD    Studies:    Events:  Subjective:    Overnight Issues:   Objective:  Vital signs for last 24 hours: Temp:  [93.7 F (34.3 C)-98.7 F (37.1 C)] 97 F (36.1 C) (01/10 0700) Pulse Rate:  [28-134] 96 (01/10 0700) Resp:  [14-36] 25 (01/10 0700) BP: (46-157)/(11-141) 83/64 (01/10 0700) SpO2:  [86 %-100 %] 86 % (01/10 0700) Arterial Line BP: (49-176)/(27-72) 116/54 (01/10 0615) FiO2 (%):  [40 %-100 %] 40 % (01/10 0600)  Hemodynamic parameters for last 24 hours: CVP:  [8  mmHg-12 mmHg] 9 mmHg  Intake/Output from previous day: 01/09 0701 - 01/10 0700 In: 8832.1 [I.V.:4814.6; Blood:2100; NG/GT:368.8; IV Piggyback:1498.7] Out: 2101 [Urine:20; Blood:600]  Intake/Output this shift: No intake/output data recorded.  Vent settings for last 24 hours: Vent Mode: PRVC FiO2 (%):  [40 %-100 %] 40 % Set Rate:  [30 bmp] 30 bmp Vt Set:  [500 mL] 500 mL PEEP:  [5 cmH20] 5 cmH20 Pressure Support:  [5 cmH20] 5 cmH20 Plateau Pressure:  [21 cmH20-24 cmH20] 24 cmH20  Physical Exam:  General: sedated Neuro: sedated on venr HEENT/Neck: ETT Resp: clear to auscultation bilaterally CVS: RRR GI: soft, wound OK Extremities: edema 3+  Results for orders placed or performed during the hospital encounter of 04/08/2018 (from the past 24 hour(s))  Glucose, capillary     Status: None   Collection Time: 05/15/2018  7:40 AM  Result Value Ref Range   Glucose-Capillary 84 70 - 99 mg/dL   Comment 1 Notify RN    Comment 2 Document in Chart   Glucose, capillary     Status: Abnormal   Collection Time: 05/13/2018  4:01 PM  Result Value Ref Range   Glucose-Capillary 110 (H) 70 - 99 mg/dL   Comment 1 Notify RN    Comment 2 Document in Chart   Renal function panel (daily at 1600)     Status: Abnormal   Collection Time: 05/28/2018  4:28 PM  Result Value Ref Range   Sodium 135 135 - 145 mmol/L   Potassium 5.5 (H) 3.5 - 5.1 mmol/L   Chloride 104 98 - 111 mmol/L   CO2 19 (L) 22 - 32 mmol/L   Glucose, Bld 139 (H) 70 - 99 mg/dL   BUN 50 (H) 6 - 20 mg/dL   Creatinine, Ser 9.602.33 (H) 0.61 - 1.24 mg/dL   Calcium 8.0 (L) 8.9 - 10.3 mg/dL   Phosphorus 6.9 (H) 2.5 - 4.6 mg/dL   Albumin 2.1 (L) 3.5 - 5.0 g/dL   GFR calc non Af Amer 31 (L) >60 mL/min   GFR calc Af Amer 36 (L) >60 mL/min   Anion gap 12 5 - 15  Glucose, capillary     Status: None   Collection Time: 05/31/2018  7:42 PM  Result Value  Ref Range   Glucose-Capillary 96 70 - 99 mg/dL  I-STAT, chem 8     Status: Abnormal   Collection  Time: 05/18/2018  8:08 PM  Result Value Ref Range   Sodium 136 135 - 145 mmol/L   Potassium 5.9 (H) 3.5 - 5.1 mmol/L   Chloride 108 98 - 111 mmol/L   BUN 47 (H) 6 - 20 mg/dL   Creatinine, Ser 1.61 (H) 0.61 - 1.24 mg/dL   Glucose, Bld 80 70 - 99 mg/dL   Calcium, Ion 0.96 (L) 1.15 - 1.40 mmol/L   TCO2 14 (L) 22 - 32 mmol/L   Hemoglobin 7.1 (L) 13.0 - 17.0 g/dL   HCT 04.5 (L) 40.9 - 81.1 %  Prepare RBC     Status: None   Collection Time: 05/18/2018  8:32 PM  Result Value Ref Range   Order Confirmation      ORDER PROCESSED BY BLOOD BANK Performed at [facility], [address], [place], [place] [address]   Lactic acid, plasma     Status: Abnormal   Collection Time: 05/10/2018  9:05 PM  Result Value Ref Range   Lactic Acid, Venous 14.4 (HH) 0.5 - 1.9 mmol/L  Renal function panel     Status: Abnormal   Collection Time: 05/20/2018  9:06 PM  Result Value Ref Range   Sodium 138 135 - 145 mmol/L   Potassium 6.6 (HH) 3.5 - 5.1 mmol/L   Chloride 106 98 - 111 mmol/L   CO2 8 (L) 22 - 32 mmol/L   Glucose, Bld 42 (LL) 70 - 99 mg/dL   BUN 42 (H) 6 - 20 mg/dL   Creatinine, Ser 2.95 (H) 0.61 - 1.24 mg/dL   Calcium 8.0 (L) 8.9 - 10.3 mg/dL   Phosphorus 62.1 (H) 2.5 - 4.6 mg/dL   Albumin 2.0 (L) 3.5 - 5.0 g/dL   GFR calc non Af Amer 39 (L) >60 mL/min   GFR calc Af Amer 45 (L) >60 mL/min   Anion gap 24 (H) 5 - 15  CBC     Status: Abnormal   Collection Time: 05/15/2018  9:06 PM  Result Value Ref Range   WBC 20.1 (H) 4.0 - 10.5 K/uL   RBC 2.24 (L) 4.22 - 5.81 MIL/uL   Hemoglobin 6.9 (LL) 13.0 - 17.0 g/dL   HCT 30.8 (L) 65.7 - 84.6 %   MCV 100.0 80.0 - 100.0 fL   MCH 30.8 26.0 - 34.0 pg   MCHC 30.8 30.0 - 36.0 g/dL   RDW 96.2 (H) 95.2 - 84.1 %   Platelets 82 (L) 150 - 400 K/uL   nRBC 34.9 (H) 0.0 - 0.2 %  Glucose, capillary     Status: None   Collection Time: 06/01/2018 10:00 PM  Result Value Ref Range   Glucose-Capillary 87 70 - 99 mg/dL   Comment 1 Notify RN    Comment 2 Document in  Chart   Prepare RBC     Status: None   Collection Time: 05/16/2018 10:15 PM  Result Value Ref Range   Order Confirmation      ORDER PROCESSED BY BLOOD BANK Performed at [facility], [address] [place], [place] [address]   I-STAT 3, arterial blood gas (G3+)     Status: Abnormal   Collection Time: 05/13/2018 10:17 PM  Result Value Ref Range   pH, Arterial 7.129 (LL) 7.350 - 7.450   pCO2 arterial 24.7 (L) 32.0 - 48.0 mmHg   pO2,  Arterial 164.0 (H) 83.0 - 108.0 mmHg   Bicarbonate 8.3 (L) 20.0 - 28.0 mmol/L   TCO2 9 (L) 22 - 32 mmol/L   O2 Saturation 99.0 %   Acid-base deficit 19.0 (H) 0.0 - 2.0 mmol/L   Patient temperature 96.8 F    Collection site ARTERIAL LINE    Drawn by RT    Sample type ARTERIAL    Comment NOTIFIED PHYSICIAN   Glucose, capillary     Status: Abnormal   Collection Time: 05/27/2018 11:07 PM  Result Value Ref Range   Glucose-Capillary 41 (LL) 70 - 99 mg/dL  Glucose, capillary     Status: Abnormal   Collection Time: 05-27-2018 11:37 PM  Result Value Ref Range   Glucose-Capillary 126 (H) 70 - 99 mg/dL  Potassium     Status: Abnormal   Collection Time: May 27, 2018 11:45 PM  Result Value Ref Range   Potassium 6.1 (H) 3.5 - 5.1 mmol/L  Glucose, capillary     Status: Abnormal   Collection Time: 05/13/18 12:58 AM  Result Value Ref Range   Glucose-Capillary 68 (L) 70 - 99 mg/dL  Glucose, capillary     Status: Abnormal   Collection Time: 05/13/18  1:38 AM  Result Value Ref Range   Glucose-Capillary 119 (H) 70 - 99 mg/dL  Glucose, capillary     Status: Abnormal   Collection Time: 05/13/18  2:48 AM  Result Value Ref Range   Glucose-Capillary 65 (L) 70 - 99 mg/dL  Renal function panel (daily at 0500)     Status: Abnormal   Collection Time: 05/13/18  3:00 AM  Result Value Ref Range   Sodium 136 135 - 145 mmol/L   Potassium 6.2 (H) 3.5 - 5.1 mmol/L   Chloride 104 98 - 111 mmol/L   CO2 <7 (L) 22 - 32 mmol/L   Glucose, Bld 197 (H) 70 - 99 mg/dL   BUN 38 (H) 6 -  20 mg/dL   Creatinine, Ser 1.61 (H) 0.61 - 1.24 mg/dL   Calcium 7.3 (L) 8.9 - 10.3 mg/dL   Phosphorus 09.6 (H) 2.5 - 4.6 mg/dL   Albumin 1.7 (L) 3.5 - 5.0 g/dL   GFR calc non Af Amer 37 (L) >60 mL/min   GFR calc Af Amer 43 (L) >60 mL/min   Anion gap NOT CALCULATED 5 - 15  Magnesium     Status: Abnormal   Collection Time: 05/13/18  3:00 AM  Result Value Ref Range   Magnesium 2.9 (H) 1.7 - 2.4 mg/dL  CBC with Differential/Platelet     Status: Abnormal   Collection Time: 05/13/18  3:00 AM  Result Value Ref Range   WBC 25.4 (H) 4.0 - 10.5 K/uL   RBC 2.53 (L) 4.22 - 5.81 MIL/uL   Hemoglobin 7.5 (L) 13.0 - 17.0 g/dL   HCT 04.5 (L) 40.9 - 81.1 %   MCV 100.8 (H) 80.0 - 100.0 fL   MCH 29.6 26.0 - 34.0 pg   MCHC 29.4 (L) 30.0 - 36.0 g/dL   RDW 91.4 (H) 78.2 - 95.6 %   Platelets 85 (L) 150 - 400 K/uL   nRBC 45.0 (H) 0.0 - 0.2 %   Neutrophils Relative % 67 %   Neutro Abs 17.3 (H) 1.7 - 7.7 K/uL   Band Neutrophils 1 %   Lymphocytes Relative 12 %   Lymphs Abs 3.0 0.7 - 4.0 K/uL   Monocytes Relative 10 %   Monocytes Absolute 2.5 (H) 0.1 - 1.0 K/uL   Eosinophils  Relative 1 %   Eosinophils Absolute 0.3 0.0 - 0.5 K/uL   Basophils Relative 1 %   Basophils Absolute 0.3 (H) 0.0 - 0.1 K/uL   Metamyelocytes Relative 3 %   Myelocytes 5 %   Abs Immature Granulocytes 2.00 (H) 0.00 - 0.07 K/uL   Polychromasia PRESENT   Fibrinogen (coagulopathy lab panel)     Status: Abnormal   Collection Time: 05/13/18  3:00 AM  Result Value Ref Range   Fibrinogen 657 (H) 210 - 475 mg/dL  Protime-INR (coagulopathy lab panel)     Status: Abnormal   Collection Time: 05/13/18  3:00 AM  Result Value Ref Range   Prothrombin Time 25.6 (H) 11.4 - 15.2 seconds   INR 2.37   APTT (coagulopathy lab panel)     Status: None   Collection Time: 05/13/18  3:00 AM  Result Value Ref Range   aPTT 32 24 - 36 seconds  Glucose, capillary     Status: Abnormal   Collection Time: 05/13/18  3:10 AM  Result Value Ref Range    Glucose-Capillary 156 (H) 70 - 99 mg/dL  Glucose, capillary     Status: Abnormal   Collection Time: 05/13/18  3:52 AM  Result Value Ref Range   Glucose-Capillary 114 (H) 70 - 99 mg/dL  I-STAT 3, arterial blood gas (G3+)     Status: Abnormal   Collection Time: 05/13/18  3:55 AM  Result Value Ref Range   pH, Arterial 6.976 (LL) 7.350 - 7.450   pCO2 arterial 26.2 (L) 32.0 - 48.0 mmHg   pO2, Arterial 394.0 (H) 83.0 - 108.0 mmHg   Bicarbonate 6.3 (L) 20.0 - 28.0 mmol/L   TCO2 7 (L) 22 - 32 mmol/L   O2 Saturation 100.0 %   Acid-base deficit 24.0 (H) 0.0 - 2.0 mmol/L   Patient temperature 35.0 C    Collection site ARTERIAL LINE    Drawn by RT    Sample type ARTERIAL    Comment NOTIFIED PHYSICIAN   Prepare RBC     Status: None   Collection Time: 05/13/18  4:14 AM  Result Value Ref Range   Order Confirmation      ORDER PROCESSED BY BLOOD BANK Performed at [facility], [address] [place], [place] [address]   Prepare fresh frozen plasma     Status: None (Preliminary result)   Collection Time: 05/13/18  4:30 AM  Result Value Ref Range   Unit Number [id number]    Blood Component Type THAWED PLASMA    Unit division 00    Status of Unit ISSUED    Transfusion Status OK TO TRANSFUSE    Unit Number [id number]    Blood Component Type THAWED PLASMA    Unit division 00    Status of Unit ISSUED    Transfusion Status OK TO TRANSFUSE   Glucose, capillary     Status: Abnormal   Collection Time: 05/13/18  5:29 AM  Result Value Ref Range   Glucose-Capillary 101 (H) 70 - 99 mg/dL    Assessment & Plan: Present on Admission: **None**    LOS: 12 days   Additional comments:I reviewed the patient's new clinical lab test results. Marland Kitchen M[facility] Refractory shock - maxed on 4 pressors, further blood product resuscitation. Check EKG and troponin. Possible hepatic failure - concern with low CBG and elevated INR. Add ammonia and LFTs to AM labs. Jaundice has been from blood product  reabsorbtion. Open book pelvic FX- S/P ex fix and angioembolization,  ORIF today by Dr. Jena GaussHaddix Acute hypoxic ventilator dependent respiratory failure- weaning well on 5/5, guaifenesin to help thin secretions  ABL anemia  Abdominal compartment syndrome- S/P ex lap and packing 12/29 by Dr. Dwain SarnaWakefield, S/P ex lap and removal of packs 12/30 Dr. Sheliah HatchKinsinger, S/P ex lap and closure 1/3 by Dr. Janee Mornhompson Ischemic toes B - appreciate Dr. Darrick PennaFields consult and he will F/U. Try to limit pressors. AKI- CRRT per Renal - appreciate their assist. Bicarb drip for severe acidosis. T5 endplate and lumbar TVP FXs - no treatment needed per Dr. Newell CoralNudelman R femur FX- Dr. Jena GaussHaddix plans surgery today Thrombocytopenia - up some FEN- TF VTE- Lovenox D/Cd as above ID - Vanc/Zosyn empiric, WBC remains elevated, CX neg so far Dispo- ICU I spoke with his wife at the bedside regarding his significant deterioration overnight. I will ask CCM to see him again as well. Critical Care Total Time*: 45 Minutes  Violeta GelinasBurke Janaisha Tolsma, MD, MPH, [facility] Trauma: [phone] General Surgery: [phone]  05/13/2018  *Care during the described time interval was provided by me. I have reviewed this patient's available data, including medical history, events of note, physical examination and test results as part of my evaluation.  Patient ID: Bruce Henson, male   DOB: 12-05-1965, 53 y.o.   MRN: [id number]

## 2018-05-13 NOTE — Progress Notes (Signed)
Notified Dr Malen GauzeFoster (nephro) of K 6.2. Orders received to change post filter to 2K on CRRT. Still keeping patient positive as he receives multiple blood products

## 2018-05-13 NOTE — Progress Notes (Signed)
Discuss BP parameters with physician and the difference in the A-line versus cuff pressure. Decision to keep pressors at the max rate was made.

## 2018-05-13 NOTE — Progress Notes (Signed)
Patient hypotensive throughout night requiring neo, levo, vasopressin, and now low dose epi being started per CCM Dr Valora Piccolo. Has received total of 2U PRBCs and 1.5L NS bolus tonight. Issues with hypoglycemia and hyperkalemia. CCM ordered bicarb gtt w/ dextrose.  Nephrology aware of K 6.6 and now 6.1 post changing dialysate to 2K bath. Nephrology also aware of fluid boluses and keeping patient positive to help with blood pressures. 2g calcium gluconate given total per nephrology Dr Malen Gauze. Coag panel and renal function panel pending.  O2 sats no longer reading, PCCM aware and ABG ordered for this AM.

## 2018-05-13 NOTE — Progress Notes (Signed)
Updated Dr Andrey Campanile on current patient status, morning labs and ABG results.  Pt remains hypotensive and acidodic on 4 pressors. Epi gtt maxed out at 20 now. Orders received for 3 amps bicarb and increase bicarb gtt per Dr Valora Piccolo.  Dr Andrey Campanile also ordered 1u PRBCs and 2 FFP. Family called and updated on patients decline and that he is currently maxed out on life support. Wife Bruce Henson states she is going to come to hospital.

## 2018-05-13 NOTE — Progress Notes (Signed)
RT note: ventilator changes made by Dr. Kendrick Fries to pressure control ventilation for ventilator synchrony.  Will continue to monitor.
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
## 2018-05-13 NOTE — Progress Notes (Signed)
Pt has been extremely unstable all day.  RN unable to turn pt very much due to instability.  Pt is maxed out on 4 different vasopressors and anytime RN lays pt flat or turns patient, pt's BP drops dramatically.  Dr. Janee Morn aware of this.  2 amps of bicarb given during 1600 turn per MD which did seem to help BP temporarily.  Pt's family aware of severity of pt's condition but still continues to be hopeful as this is all a shock considering the patient had been doing better prior to last night.

## 2018-05-13 NOTE — Progress Notes (Signed)
Follow up ABG after administration of bicarb.  Results given to MD.  Will continue to monitor.    Ref. Range 05/13/2018 10:35  Sample type Unknown ARTERIAL  pH, Arterial Latest Ref Range: 7.350 - 7.450  7.036 (LL)  pCO2 arterial Latest Ref Range: 32.0 - 48.0 mmHg 27.9 (L)  pO2, Arterial Latest Ref Range: 83.0 - 108.0 mmHg 126.0 (H)  TCO2 Latest Ref Range: 22 - 32 mmol/L 8 (L)  Acid-base deficit Latest Ref Range: 0.0 - 2.0 mmol/L 22.0 (H)  Bicarbonate Latest Ref Range: 20.0 - 28.0 mmol/L 7.5 (L)  O2 Saturation Latest Units: % 97.0  Patient temperature Unknown 98.5 F  Collection site Unknown ARTERIAL LINE

## 2018-05-13 NOTE — Progress Notes (Signed)
eLink Physician-Brief Progress Note Patient Name: Bruce Henson DOB: 18-Jun-1965 MRN: [id number]   Date of Service  05/13/2018  HPI/Events of Note  PT with profound metabolic acidosis despite HCO3 gtt.  eICU Interventions  Give 3 amps HCO3.  Increase HCO3 gtt to 165ml/hr. Prognosis is poor. Pt is maxed on 4 pressors including epinephrine at this point.     Intervention Category Major Interventions: Electrolyte abnormality - evaluation and management  Larinda Buttery 05/13/2018, 4:14 AM

## 2018-05-14 ENCOUNTER — Inpatient Hospital Stay (HOSPITAL_COMMUNITY): Payer: 59

## 2018-05-14 LAB — BASIC METABOLIC PANEL
Anion gap: 29 — ABNORMAL HIGH (ref 5–15)
Anion gap: 25 — ABNORMAL HIGH (ref 5–15)
BUN: 23 mg/dL — ABNORMAL HIGH (ref 6–20)
BUN: 20 mg/dL (ref 6–20)
CHLORIDE: 98 mmol/L (ref 98–111)
CO2: 8 mmol/L — ABNORMAL LOW (ref 22–32)
CO2: 9 mmol/L — ABNORMAL LOW (ref 22–32)
CREATININE: 1.55 mg/dL — AB (ref 0.61–1.24)
Calcium: 6.1 mg/dL — CL (ref 8.9–10.3)
Calcium: 6.1 mg/dL — CL (ref 8.9–10.3)
Chloride: 98 mmol/L (ref 98–111)
Creatinine, Ser: 1.76 mg/dL — ABNORMAL HIGH (ref 0.61–1.24)
GFR calc Af Amer: 50 mL/min — ABNORMAL LOW (ref >60)
GFR calc Af Amer: 59 mL/min — ABNORMAL LOW (ref >60)
GFR calc non Af Amer: 43 mL/min — ABNORMAL LOW (ref >60)
GFR calc non Af Amer: 51 mL/min — ABNORMAL LOW (ref >60)
GLUCOSE: 77 mg/dL (ref 70–99)
Glucose, Bld: 117 mg/dL — ABNORMAL HIGH (ref 70–99)
Potassium: 5.3 mmol/L — ABNORMAL HIGH (ref 3.5–5.1)
Potassium: 6 mmol/L — ABNORMAL HIGH (ref 3.5–5.1)
Sodium: 135 mmol/L (ref 135–145)
Sodium: 132 mmol/L — ABNORMAL LOW (ref 135–145)

## 2018-05-14 LAB — CBC WITH DIFFERENTIAL/PLATELET
ABS IMMATURE GRANULOCYTES: 0.7 10*3/uL — AB (ref 0.00–0.07)
Band Neutrophils: 5 %
Basophils Absolute: 0.2 10*3/uL — ABNORMAL HIGH (ref 0.0–0.1)
Basophils Relative: 1 %
Eosinophils Absolute: 0.2 10*3/uL (ref 0.0–0.5)
Eosinophils Relative: 1 %
HCT: 21.7 % — ABNORMAL LOW (ref 39.0–52.0)
Hemoglobin: 6.3 g/dL — CL (ref 13.0–17.0)
Lymphocytes Relative: 6 %
Lymphs Abs: 1.4 10*3/uL (ref 0.7–4.0)
MCH: 28 pg (ref 26.0–34.0)
MCHC: 29 g/dL — ABNORMAL LOW (ref 30.0–36.0)
MCV: 96.4 fL (ref 80.0–100.0)
MONO ABS: 1.4 10*3/uL — AB (ref 0.1–1.0)
MONOS PCT: 6 %
Metamyelocytes Relative: 2 %
Myelocytes: 1 %
Neutro Abs: 19.8 10*3/uL — ABNORMAL HIGH (ref 1.7–7.7)
Neutrophils Relative %: 78 %
Platelets: 65 10*3/uL — ABNORMAL LOW (ref 150–400)
RBC: 2.25 MIL/uL — ABNORMAL LOW (ref 4.22–5.81)
RDW: 20.7 % — AB (ref 11.5–15.5)
WBC: 23.9 10*3/uL — AB (ref 4.0–10.5)
nRBC: 24.6 % — ABNORMAL HIGH (ref 0.0–0.2)
nRBC: 30 /100 WBC — ABNORMAL HIGH

## 2018-05-14 LAB — CBC
HCT: 23.8 % — ABNORMAL LOW (ref 39.0–52.0)
HCT: 28.3 % — ABNORMAL LOW (ref 39.0–52.0)
Hemoglobin: 7.4 g/dL — ABNORMAL LOW (ref 13.0–17.0)
Hemoglobin: 8.5 g/dL — ABNORMAL LOW (ref 13.0–17.0)
MCH: 29.5 pg (ref 26.0–34.0)
MCH: 29.8 pg (ref 26.0–34.0)
MCHC: 31.1 g/dL (ref 30.0–36.0)
MCHC: 30 g/dL (ref 30.0–36.0)
MCV: 94.8 fL (ref 80.0–100.0)
MCV: 99.3 fL (ref 80.0–100.0)
Platelets: 59 10*3/uL — ABNORMAL LOW (ref 150–400)
Platelets: 61 10*3/uL — ABNORMAL LOW (ref 150–400)
RBC: 2.51 MIL/uL — ABNORMAL LOW (ref 4.22–5.81)
RBC: 2.85 MIL/uL — ABNORMAL LOW (ref 4.22–5.81)
RDW: 20 % — ABNORMAL HIGH (ref 11.5–15.5)
RDW: 18.6 % — ABNORMAL HIGH (ref 11.5–15.5)
WBC: 22.9 10*3/uL — ABNORMAL HIGH (ref 4.0–10.5)
WBC: 22 10*3/uL — ABNORMAL HIGH (ref 4.0–10.5)
nRBC: 19.2 % — ABNORMAL HIGH (ref 0.0–0.2)
nRBC: 29.4 % — ABNORMAL HIGH (ref 0.0–0.2)

## 2018-05-14 LAB — GLUCOSE, CAPILLARY
Glucose-Capillary: 126 mg/dL — ABNORMAL HIGH (ref 70–99)
Glucose-Capillary: 96 mg/dL (ref 70–99)
Glucose-Capillary: 79 mg/dL (ref 70–99)
Glucose-Capillary: 69 mg/dL — ABNORMAL LOW (ref 70–99)
Glucose-Capillary: 102 mg/dL — ABNORMAL HIGH (ref 70–99)
Glucose-Capillary: 113 mg/dL — ABNORMAL HIGH (ref 70–99)

## 2018-05-14 LAB — POCT I-STAT 3, ART BLOOD GAS (G3+)
Acid-Base Excess: 16 mmol/L — ABNORMAL HIGH (ref 0.0–2.0)
Acid-base deficit: 23 mmol/L — ABNORMAL HIGH (ref 0.0–2.0)
BICARBONATE: 9.4 mmol/L — AB (ref 20.0–28.0)
Bicarbonate: 42.9 mmol/L — ABNORMAL HIGH (ref 20.0–28.0)
O2 Saturation: 94 %
O2 Saturation: 92 %
PH ART: 6.9 — AB (ref 7.350–7.450)
PH ART: 7.362 (ref 7.350–7.450)
Patient temperature: 97.5
Patient temperature: 97.5
TCO2: 11 mmol/L — ABNORMAL LOW (ref 22–32)
TCO2: 45 mmol/L — ABNORMAL HIGH (ref 22–32)
pCO2 arterial: 47.4 mmHg (ref 32.0–48.0)
pCO2 arterial: 75.1 mmHg (ref 32.0–48.0)
pO2, Arterial: 113 mmHg — ABNORMAL HIGH (ref 83.0–108.0)
pO2, Arterial: 67 mmHg — ABNORMAL LOW (ref 83.0–108.0)

## 2018-05-14 LAB — RENAL FUNCTION PANEL
Albumin: 1.6 g/dL — ABNORMAL LOW (ref 3.5–5.0)
Albumin: 1.3 g/dL — ABNORMAL LOW (ref 3.5–5.0)
Anion gap: 30 — ABNORMAL HIGH (ref 5–15)
Anion gap: 27 — ABNORMAL HIGH (ref 5–15)
BUN: 29 mg/dL — ABNORMAL HIGH (ref 6–20)
BUN: 21 mg/dL — ABNORMAL HIGH (ref 6–20)
CO2: 7 mmol/L — ABNORMAL LOW (ref 22–32)
CO2: 8 mmol/L — ABNORMAL LOW (ref 22–32)
Calcium: 6.3 mg/dL — CL (ref 8.9–10.3)
Calcium: 6 mg/dL — CL (ref 8.9–10.3)
Chloride: 99 mmol/L (ref 98–111)
Chloride: 98 mmol/L (ref 98–111)
Creatinine, Ser: 1.65 mg/dL — ABNORMAL HIGH (ref 0.61–1.24)
Creatinine, Ser: 1.76 mg/dL — ABNORMAL HIGH (ref 0.61–1.24)
GFR calc Af Amer: 55 mL/min — ABNORMAL LOW (ref >60)
GFR calc non Af Amer: 47 mL/min — ABNORMAL LOW (ref >60)
GFR calc non Af Amer: 43 mL/min — ABNORMAL LOW (ref >60)
GFR, EST AFRICAN AMERICAN: 50 mL/min — AB (ref >60)
Glucose, Bld: 116 mg/dL — ABNORMAL HIGH (ref 70–99)
Glucose, Bld: 112 mg/dL — ABNORMAL HIGH (ref 70–99)
Phosphorus: 8.9 mg/dL — ABNORMAL HIGH (ref 2.5–4.6)
Phosphorus: 9.4 mg/dL — ABNORMAL HIGH (ref 2.5–4.6)
Potassium: 5.1 mmol/L (ref 3.5–5.1)
Potassium: 5.5 mmol/L — ABNORMAL HIGH (ref 3.5–5.1)
Sodium: 136 mmol/L (ref 135–145)
Sodium: 133 mmol/L — ABNORMAL LOW (ref 135–145)

## 2018-05-14 LAB — AMMONIA: Ammonia: 91 umol/L — ABNORMAL HIGH (ref 9–35)

## 2018-05-14 LAB — PREPARE FRESH FROZEN PLASMA
Unit division: 0
Unit division: 0

## 2018-05-14 LAB — PHOSPHORUS: Phosphorus: 8.8 mg/dL — ABNORMAL HIGH (ref 2.5–4.6)

## 2018-05-14 LAB — BPAM FFP
BLOOD PRODUCT EXPIRATION DATE: 202001142359
Blood Product Expiration Date: 202001142359
ISSUE DATE / TIME: 202001100432
ISSUE DATE / TIME: 202001100432
Unit Type and Rh: 600
Unit Type and Rh: 6200

## 2018-05-14 LAB — MAGNESIUM: Magnesium: 2.6 mg/dL — ABNORMAL HIGH (ref 1.7–2.4)

## 2018-05-14 LAB — PREPARE RBC (CROSSMATCH)

## 2018-05-14 LAB — PROCALCITONIN: Procalcitonin: 7.44 ng/mL

## 2018-05-14 MED ORDER — SODIUM BICARBONATE 8.4 % IV SOLN
INTRAVENOUS | Status: AC
  Administered 2018-05-14: 100 meq via INTRAVENOUS
  Filled 2018-05-14: qty 50

## 2018-05-14 MED ORDER — PRISMASOL BGK 4/2.5 32-4-2.5 MEQ/L IV SOLN
INTRAVENOUS | Status: DC
  Administered 2018-05-14 (×2): via INTRAVENOUS_CENTRAL
  Filled 2018-05-14 (×8): qty 5000

## 2018-05-14 MED ORDER — STERILE WATER FOR INJECTION IV SOLN
INTRAVENOUS | Status: DC
  Administered 2018-05-14: 22:00:00 via INTRAVENOUS_CENTRAL
  Filled 2018-05-14 (×5): qty 150

## 2018-05-14 MED ORDER — SODIUM BICARBONATE 8.4 % IV SOLN
100.0000 meq | Freq: Once | INTRAVENOUS | Status: AC
  Administered 2018-05-14: 100 meq via INTRAVENOUS

## 2018-05-14 MED ORDER — PRISMASOL BGK 0/2.5 32-2.5 MEQ/L IV SOLN
INTRAVENOUS | Status: DC
  Administered 2018-05-14: 06:00:00 via INTRAVENOUS_CENTRAL
  Filled 2018-05-14 (×2): qty 5000

## 2018-05-14 MED ORDER — SODIUM BICARBONATE 8.4 % IV SOLN
200.0000 meq | Freq: Once | INTRAVENOUS | Status: AC
  Administered 2018-05-14: 200 meq via INTRAVENOUS
  Filled 2018-05-14: qty 200

## 2018-05-14 MED ORDER — SODIUM CHLORIDE 0.9 % FOR CRRT
INTRAVENOUS_CENTRAL | Status: DC | PRN
  Filled 2018-05-14: qty 1000

## 2018-05-14 MED ORDER — DEXTROSE 10 % IV SOLN
INTRAVENOUS | Status: DC
  Administered 2018-05-14: 18:00:00 via INTRAVENOUS
  Filled 2018-05-14: qty 1000

## 2018-05-14 MED ORDER — SODIUM CHLORIDE 0.9% IV SOLUTION: Freq: Once | INTRAVENOUS | Status: DC

## 2018-05-14 MED ORDER — DEXTROSE 50 % IV SOLN
INTRAVENOUS | Status: AC
  Administered 2018-05-14: 16:00:00
  Filled 2018-05-14: qty 50

## 2018-05-14 MED ORDER — PRISMASOL BGK 0/2.5 32-2.5 MEQ/L IV SOLN
INTRAVENOUS | Status: DC
  Filled 2018-05-14 (×2): qty 5000

## 2018-05-14 MED ORDER — PRISMASOL BGK 0/2.5 32-2.5 MEQ/L IV SOLN
INTRAVENOUS | Status: DC
  Filled 2018-05-14 (×5): qty 5000

## 2018-05-14 MED ORDER — PRISMASOL BGK 4/2.5 32-4-2.5 MEQ/L REPLACEMENT SOLN
Status: DC
  Filled 2018-05-14 (×2): qty 5000

## 2018-05-14 MED ORDER — PANTOPRAZOLE SODIUM 40 MG IV SOLR
40.0000 mg | INTRAVENOUS | Status: DC
  Administered 2018-05-14: 40 mg via INTRAVENOUS
  Filled 2018-05-14: qty 40

## 2018-05-14 MED ORDER — CALCIUM GLUCONATE-NACL 2-0.675 GM/100ML-% IV SOLN
2.0000 g | Freq: Once | INTRAVENOUS | Status: AC
  Administered 2018-05-14: 2000 mg via INTRAVENOUS
  Filled 2018-05-14: qty 100

## 2018-05-14 MED ORDER — PRISMASOL BGK 0/2.5 32-2.5 MEQ/L IV SOLN
INTRAVENOUS | Status: DC
  Administered 2018-05-14 (×2): via INTRAVENOUS_CENTRAL
  Filled 2018-05-14 (×6): qty 5000

## 2018-05-14 MED ORDER — PRISMASOL BGK 0/2.5 32-2.5 MEQ/L IV SOLN
INTRAVENOUS | Status: DC
  Administered 2018-05-14: 22:00:00 via INTRAVENOUS_CENTRAL
  Filled 2018-05-14 (×6): qty 5000

## 2018-05-14 MED ORDER — SODIUM BICARBONATE 8.4 % IV SOLN
INTRAVENOUS | Status: AC
  Administered 2018-05-14: 100 meq via INTRAVENOUS
  Filled 2018-05-14: qty 100

## 2018-05-14 MED ORDER — PRISMASOL BGK 4/2.5 32-4-2.5 MEQ/L REPLACEMENT SOLN
Status: DC
  Administered 2018-05-14: 16:00:00 via INTRAVENOUS_CENTRAL
  Filled 2018-05-14 (×2): qty 5000

## 2018-05-14 MED ORDER — PRISMASOL BGK 0/2.5 32-2.5 MEQ/L IV SOLN
INTRAVENOUS | Status: DC
  Filled 2018-05-14: qty 5000

## 2018-05-14 MED ORDER — STERILE WATER FOR INJECTION IV SOLN
INTRAVENOUS | Status: DC
  Administered 2018-05-14: 22:00:00 via INTRAVENOUS_CENTRAL
  Filled 2018-05-14 (×7): qty 150

## 2018-05-15 LAB — TYPE AND SCREEN
ABO/RH(D): O POS
Antibody Screen: NEGATIVE
UNIT DIVISION: 0
Unit division: 0
Unit division: 0
Unit division: 0
Unit division: 0
Unit division: 0
Unit division: 0
Unit division: 0
Unit division: 0
Unit division: 0

## 2018-05-15 LAB — BPAM RBC
BLOOD PRODUCT EXPIRATION DATE: 202002092359
Blood Product Expiration Date: 202002042359
Blood Product Expiration Date: 202002062359
Blood Product Expiration Date: 202002072359
Blood Product Expiration Date: 202002092359
Blood Product Expiration Date: 202002092359
Blood Product Expiration Date: 202002092359
Blood Product Expiration Date: 202002092359
Blood Product Expiration Date: 202002092359
Blood Product Expiration Date: 202002092359
ISSUE DATE / TIME: 202001081209
ISSUE DATE / TIME: 202001092043
ISSUE DATE / TIME: 202001092235
ISSUE DATE / TIME: 202001100426
ISSUE DATE / TIME: 202001100738
ISSUE DATE / TIME: 202001100738
ISSUE DATE / TIME: 202001111439
ISSUE DATE / TIME: 202001111624
UNIT TYPE AND RH: 5100
Unit Type and Rh: 5100
Unit Type and Rh: 5100
Unit Type and Rh: 5100
Unit Type and Rh: 5100
Unit Type and Rh: 5100
Unit Type and Rh: 5100
Unit Type and Rh: 5100
Unit Type and Rh: 5100
Unit Type and Rh: 5100

## 2018-05-15 LAB — POCT I-STAT 3, ART BLOOD GAS (G3+)
Acid-base deficit: 19 mmol/L — ABNORMAL HIGH (ref 0.0–2.0)
Bicarbonate: 9 mmol/L — ABNORMAL LOW (ref 20.0–28.0)
O2 SAT: 99 %
Patient temperature: 36.1
TCO2: 10 mmol/L — ABNORMAL LOW (ref 22–32)
pCO2 arterial: 28.7 mmHg — ABNORMAL LOW (ref 32.0–48.0)
pH, Arterial: 7.097 — CL (ref 7.350–7.450)
pO2, Arterial: 220 mmHg — ABNORMAL HIGH (ref 83.0–108.0)

## 2018-05-15 NOTE — Progress Notes (Signed)
65 cc high concentration fentanyl wasted with Interior and spatial designer.

## 2018-05-15 NOTE — Progress Notes (Signed)
Pt wife provides Case-[id number] as incident that brought pt to hospital.

## 2018-05-15 NOTE — Progress Notes (Signed)
   05/15/18 0000  Clinical Encounter Type  Visited With Patient and family together  Visit Type Initial  Referral From Nurse  Consult/Referral To Chaplain  Spiritual Encounters  Spiritual Needs Grief support;Emotional;Prayer  Stress Factors  Patient Stress Factors Other (Comment)  Family Stress Factors Major life changes;Loss of control;Exhausted   Responded to page from floor Nurse stating PT expired and family needing emotional support. Family was emotional and grieving. I offered spiritual support with words of encouragement, ministry of presence, a listening ear and prayer. Wife(Gayle [phone]) stated that she would like PT cared for by [facility] in [place]. Family was very thankful for the chaplain visit.   Chaplain Orest Dikes [phone]

## 2018-05-16 ENCOUNTER — Encounter (HOSPITAL_COMMUNITY): Payer: Self-pay | Admitting: General Surgery

## 2018-05-17 ENCOUNTER — Encounter (HOSPITAL_COMMUNITY): Payer: Self-pay | Admitting: Student

## 2018-06-04 NOTE — Progress Notes (Addendum)
Trauma MD made aware pt pupils no longer reactive to light. MD order to make note in chart. Unable to scan pt at this time d/t medication gtts.
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
## 2018-06-04 NOTE — Progress Notes (Signed)
Multiple issues addressed with [facility] MD. Pt continues to have low BP. See MD note.

## 2018-06-04 NOTE — Progress Notes (Signed)
eLink Physician-Brief Progress Note Patient Name: Bruce Henson DOB: 1965-11-19 MRN: [id number]   Date of Service  05/25/2018  HPI/Events of Note  Multiple issues: 1. Ca++ = 6.0 and albumin = 1.3 and ABG on 40%/PRVC 18/Rate 15/P 5 = 6.9/47.4/113.0  eICU Interventions  Will order: 1. NaHCO3 200 meq IV now.  2. Increase NaHCO3 IV infusion to 150 mL/hour.  3. Replace Ca++. 4. Change ventilator settings to: 40%/PRVC 22/TV 400/P 5. 5. Repeat ABG at 9:30 PM.     Intervention Category Major Interventions: Acid-Base disturbance - evaluation and management;Electrolyte abnormality - evaluation and management;Respiratory failure - evaluation and management  Lenell Antu 05/13/2018, 8:44 PM

## 2018-06-04 NOTE — Progress Notes (Signed)
CRITICAL VALUE ALERT  Critical Value:  HGB 6.3  Date & Time Notied:  05/11/2018 1430  Provider Notified: Dr. Corliss Skainssuei  Orders Received/Actions taken: 2 units PRBC ordered to transfuse

## 2018-06-04 NOTE — Progress Notes (Signed)
CRITICAL VALUE ALERT  Critical Value:  CBG 69  Date & Time Notied:  05/07/2018  Provider Notified: Dr. Corliss Skains  Orders Received/Actions taken: give 1/2 amp bicarb and call ccm for drip orders
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
## 2018-06-04 NOTE — Progress Notes (Signed)
Discussed with wife no escalation of medical support given he is on 4 pressors at max doses. He is already DNR.   At 23:00 he was noted to be in asystole on 2018-05-28. Cause of death: septic shock probably from pneumonia Patient examined and there is an absence of pulse, respiration, pupil response  Wife, sister, aunt, and family friend present.  Griffin Basil, M.D. [facility] Pulmonary/Critical Care Medicine After hours pager: [phone].

## 2018-06-04 NOTE — Progress Notes (Signed)
CRRT/ labs discussed with nephro MD. MD to enter orders
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
## 2018-06-04 DEATH — deceased

## 2019-09-26 IMAGING — DX DG ABD PORTABLE 1V
1 series · 1 of 1 positions shown · non-contrast
Comparison: CT 04/30/2018

CLINICAL DATA: OG tube placement

EXAM:
PORTABLE ABDOMEN - 1 VIEW

[abdomen]
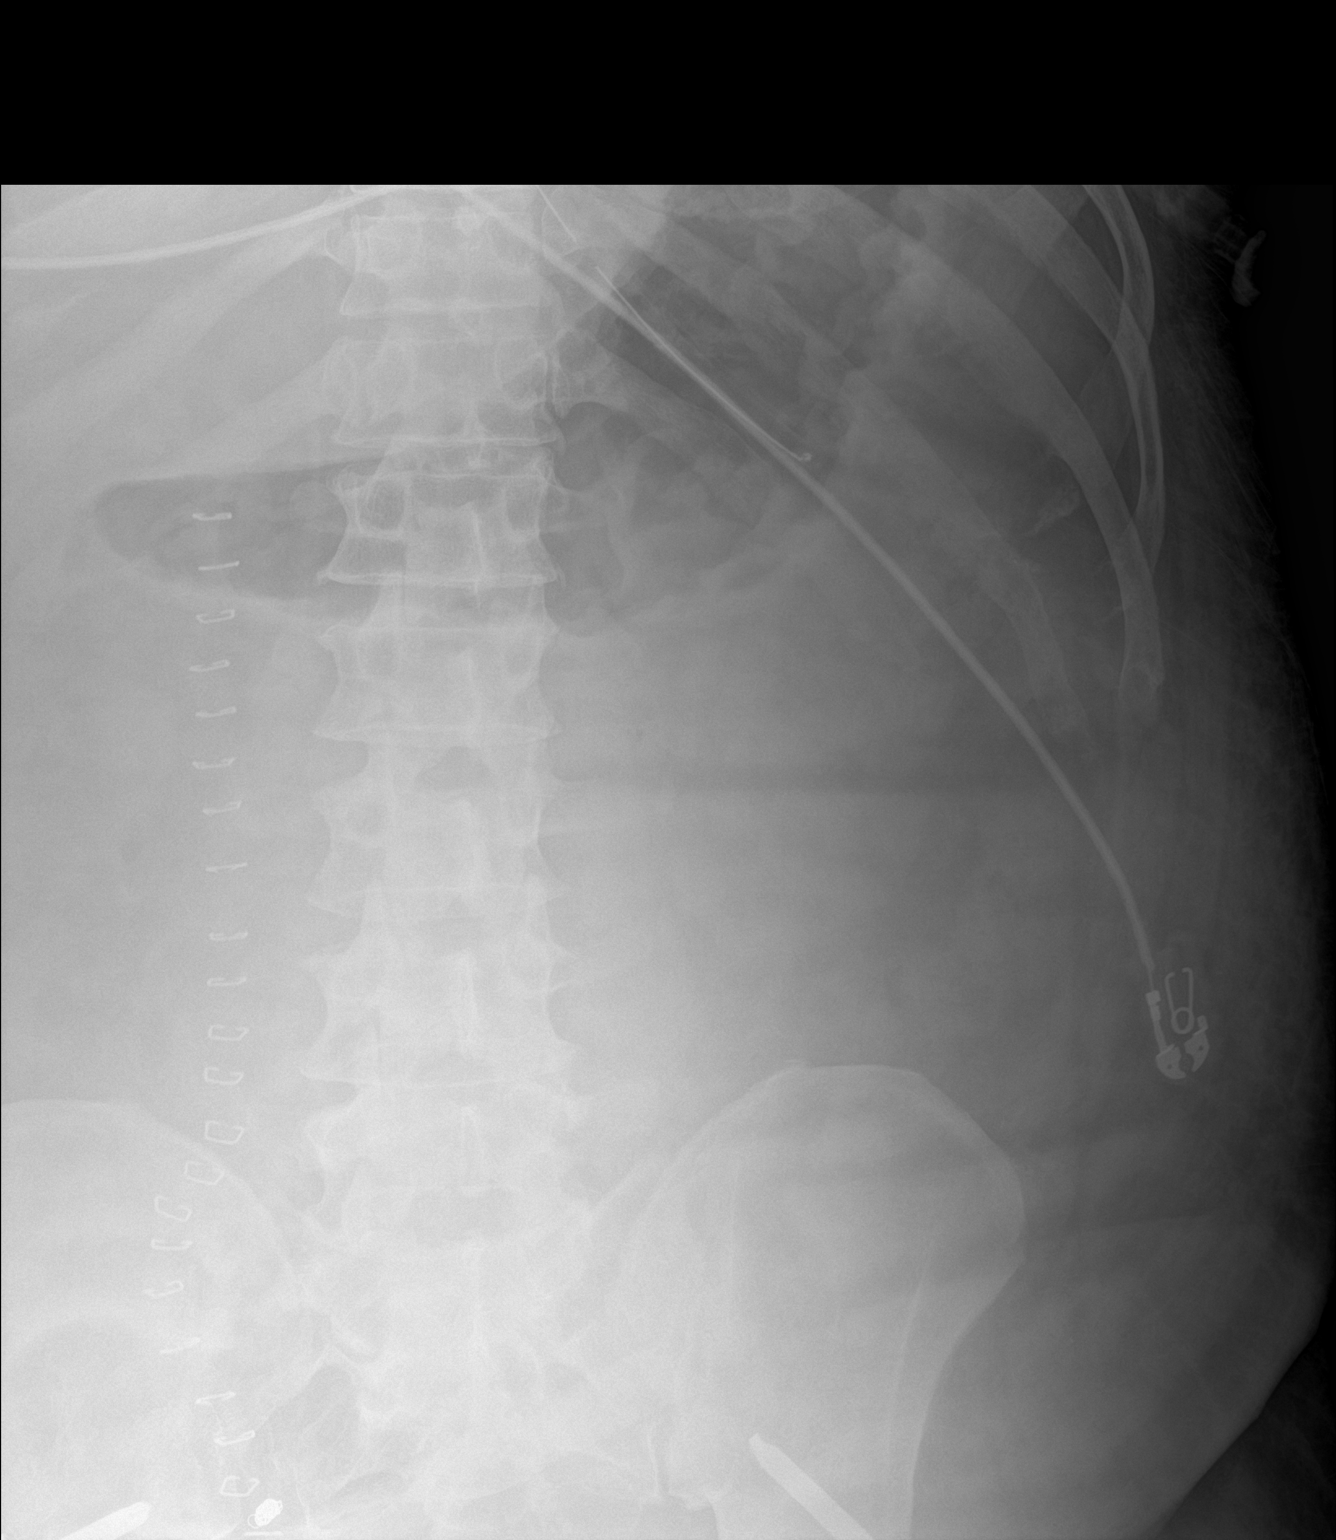

[1 of 1 positions shown; findings below may reference images not displayed]

FINDINGS: Cutaneous staples over the abdomen to the right of midline.
Esophageal tube tip overlies the gastric body. There is mild air
distention of the stomach. Gas pattern is non obstructed. Coils and
hardware in the pelvis. Relatively decreased bowel gas.
IMPRESSION: Esophageal tube tip projects over the gastric body.

## 2019-09-27 IMAGING — DX DG CHEST PORT 1 VIEW
1 series · 1 of 1 positions shown · non-contrast
Comparison: Film from earlier in the same day.

CLINICAL DATA: Check endotracheal tube placement

EXAM:
PORTABLE CHEST 1 VIEW

[chest]
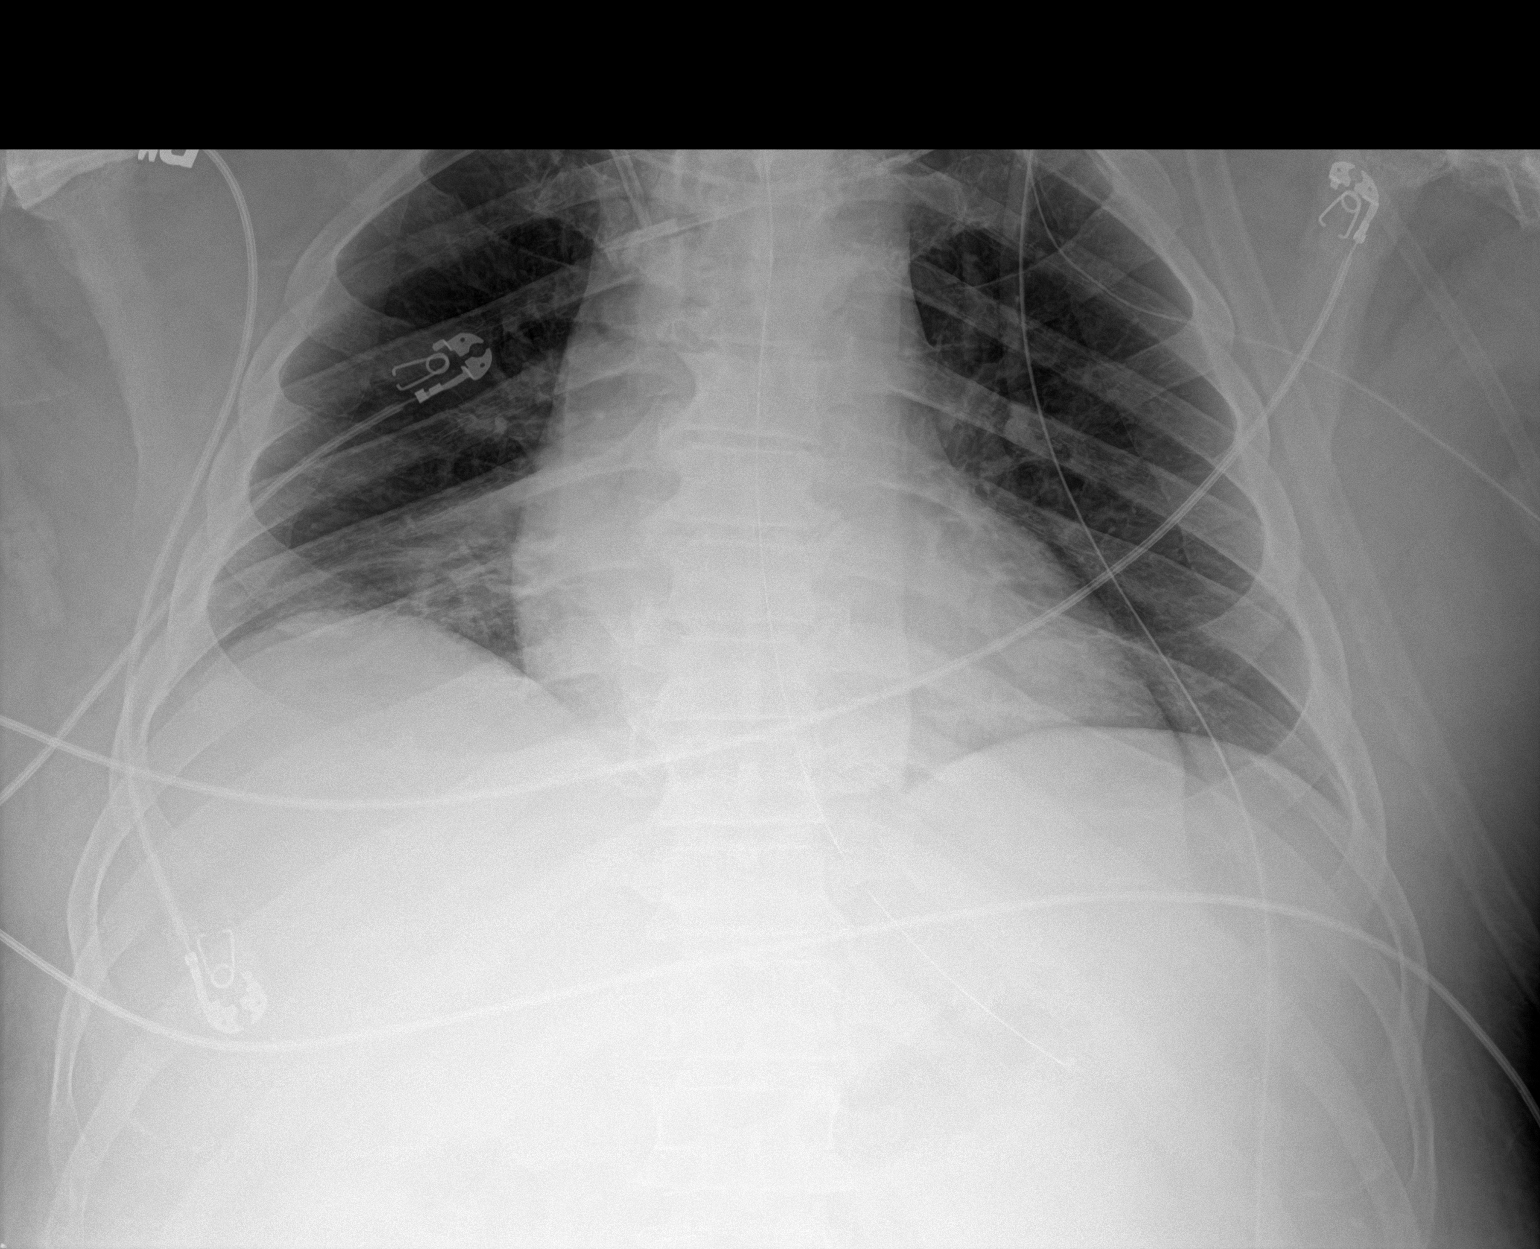

[1 of 1 positions shown; findings below may reference images not displayed]

FINDINGS: Endotracheal tube is noted 6 cm above the carina. Gastric catheter
is noted extending into the stomach with the proximal side port at
the level of the gastroesophageal junction. Right subclavian central
line left jugular central line are again noted and stable. The lungs
are hypoinflated although no focal infiltrate is seen. Mild
bibasilar atelectasis is again noted. No pneumothorax is seen.
IMPRESSION: Tubes and lines as described above.

Mild bibasilar atelectasis.

## 2019-09-27 IMAGING — RF ORIF RIGHT FEMUR
1 series · 10 of 10 positions shown · non-contrast
Comparison: 05/02/2018.

CLINICAL DATA: ORIF.

EXAM:
DG C-ARM 61-120 MIN

[Series 1: run · 10 of 10 slices shown]
[im 1/10]
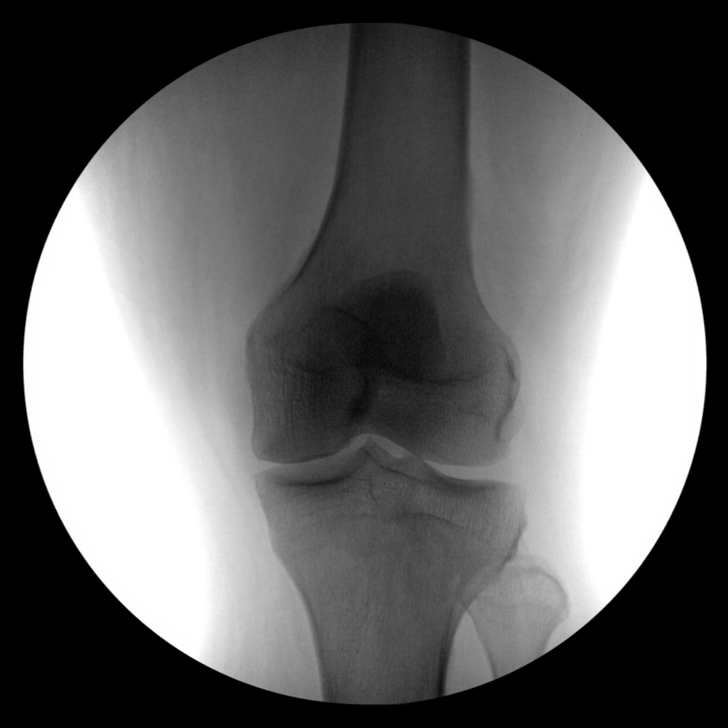
[im 2/10]
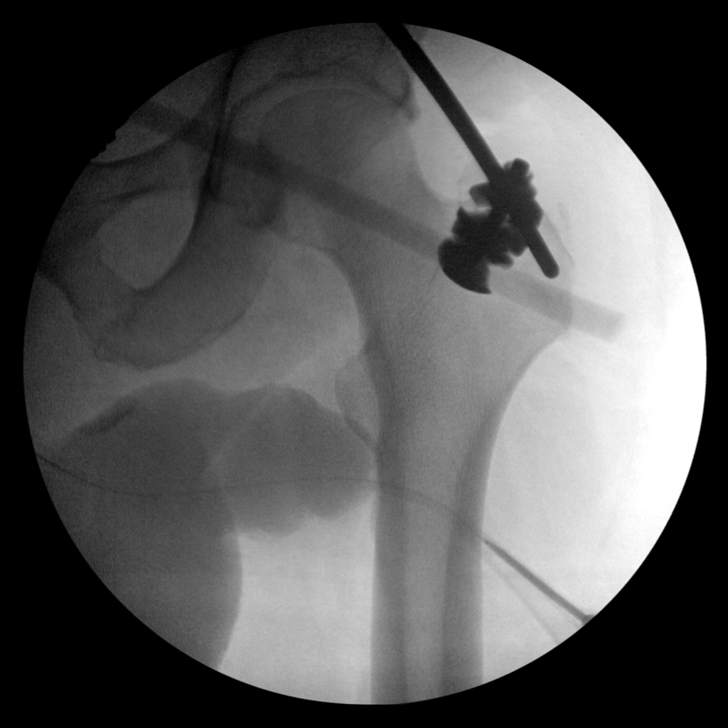
[im 3/10]
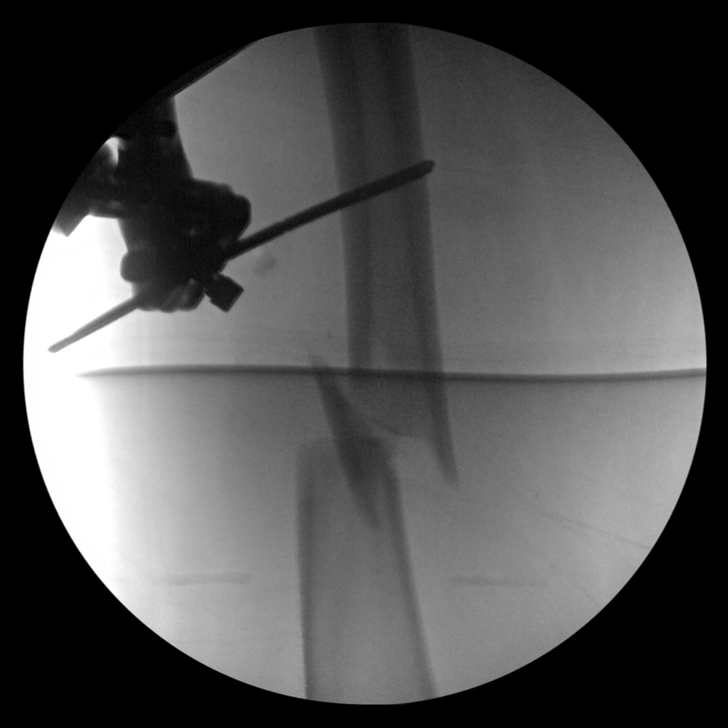
[im 4/10]
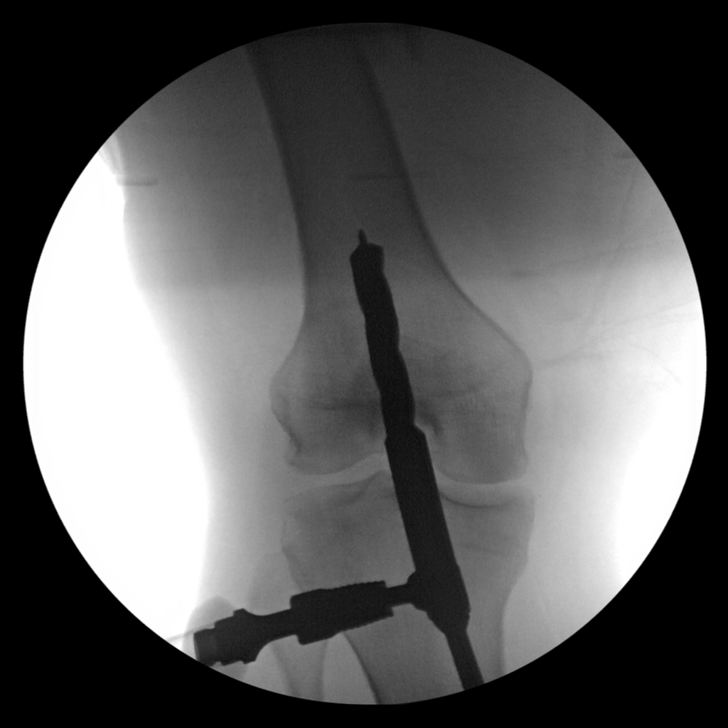
[im 5/10]
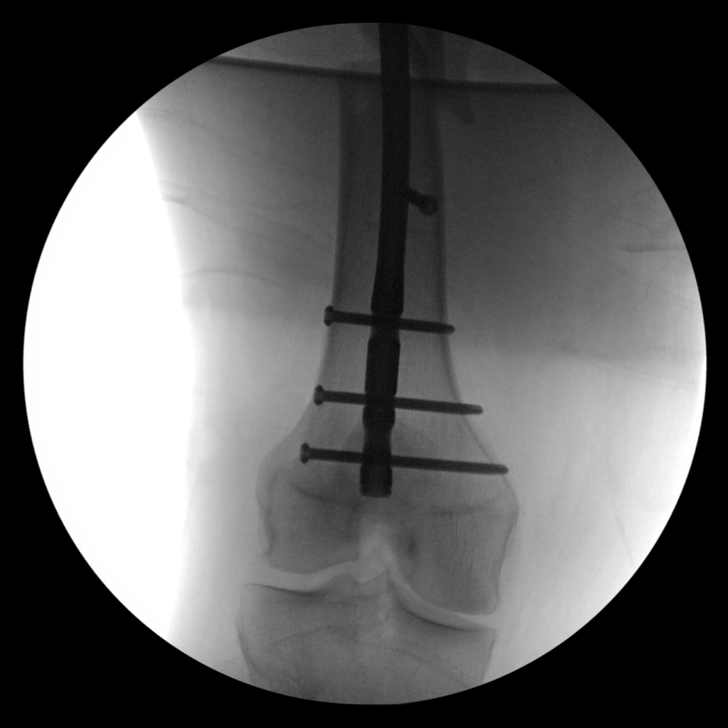
[im 6/10]
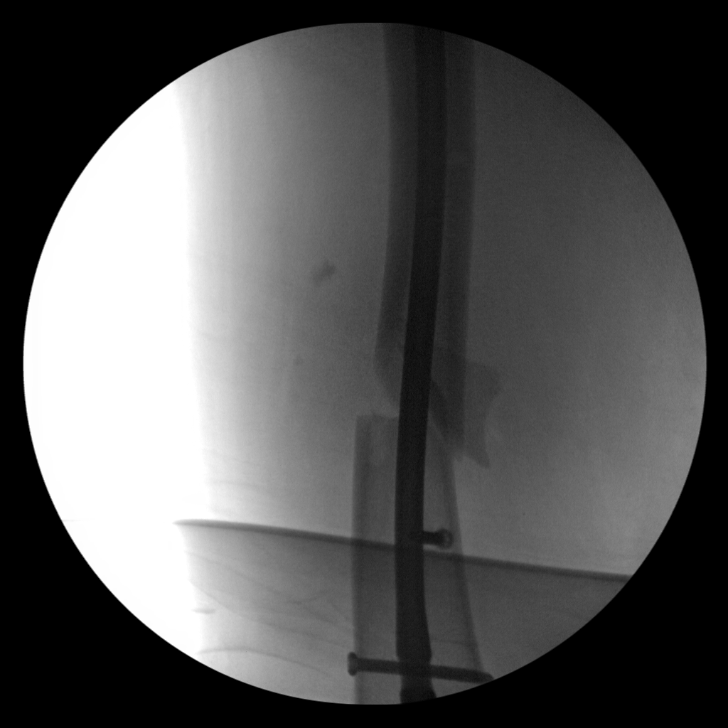
[im 7/10]
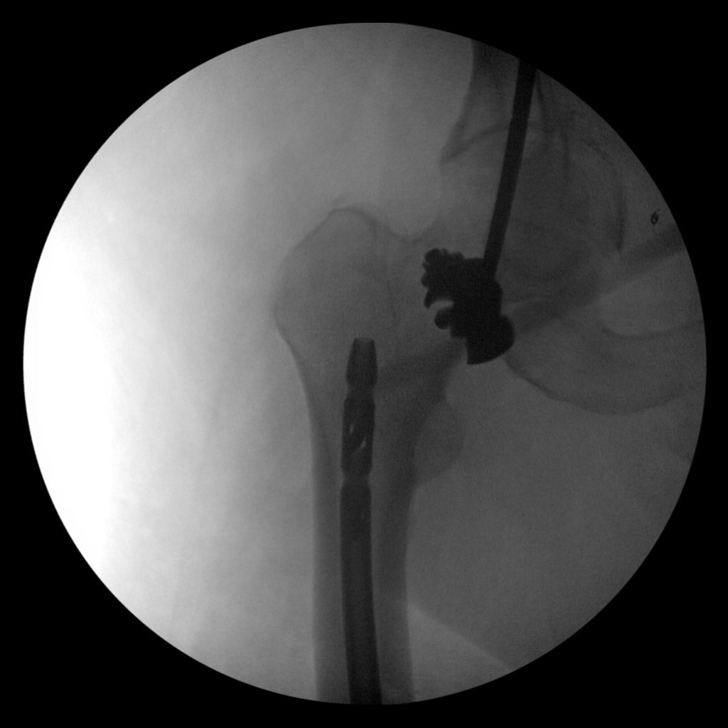
[im 8/10]
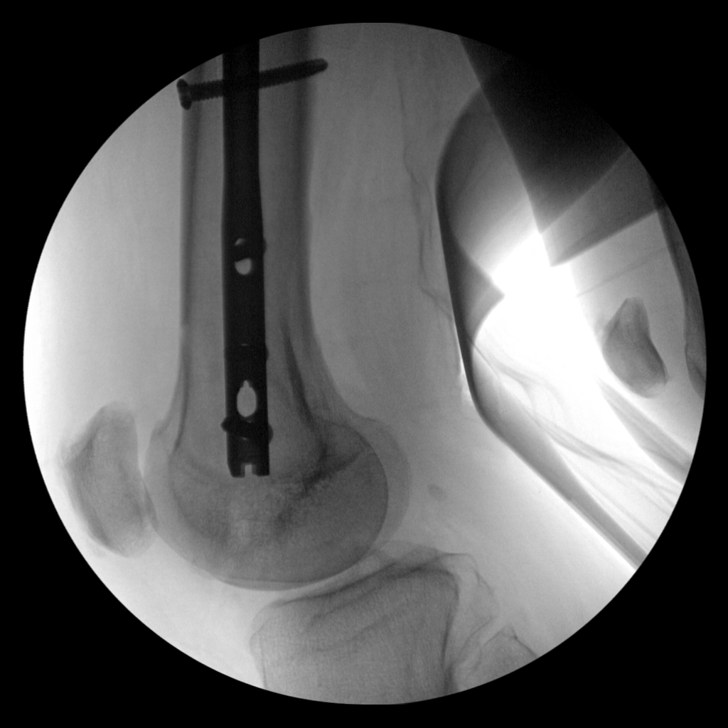
[im 9/10]
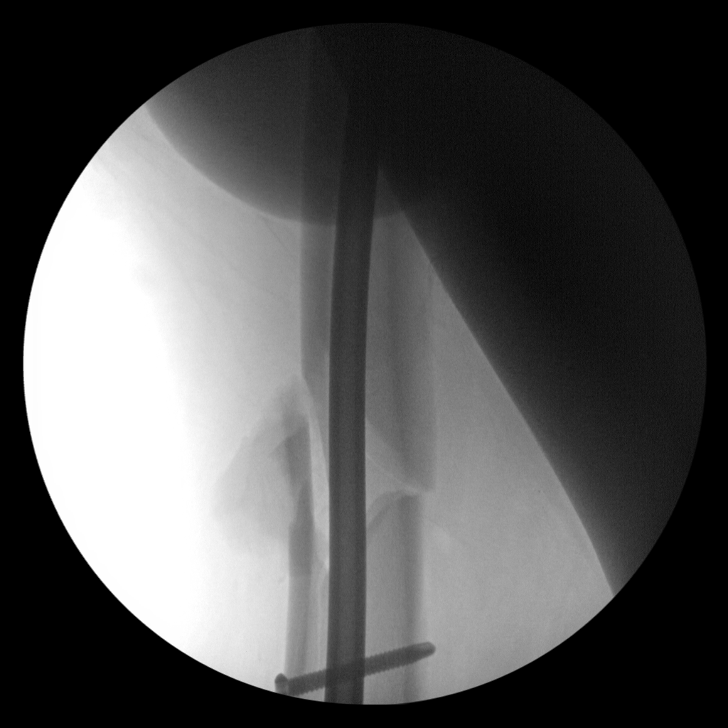
[im 10/10]
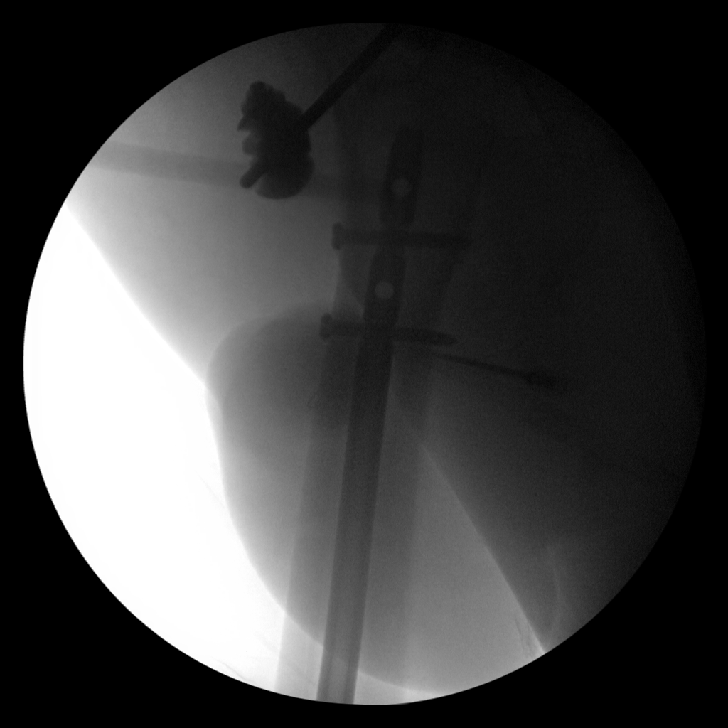

[10 of 10 positions shown; findings below may reference images not displayed]

FINDINGS: ORIF right femur. Hardware intact. Near anatomic alignment.
Displaced comminuted fracture fragment noted. Ten images obtained. 7
minutes 49 seconds fluoroscopy time utilized.
IMPRESSION: ORIF right femur.  Near anatomic alignment.

## 2019-09-28 IMAGING — DX DG PELVIS PORTABLE
2 series · 2 of 2 positions shown · non-contrast
Comparison: 05/12/2018

CLINICAL DATA: Bleeding

EXAM:
PORTABLE PELVIS 1-2 VIEWS

[pelvis ap (1 of 2)]
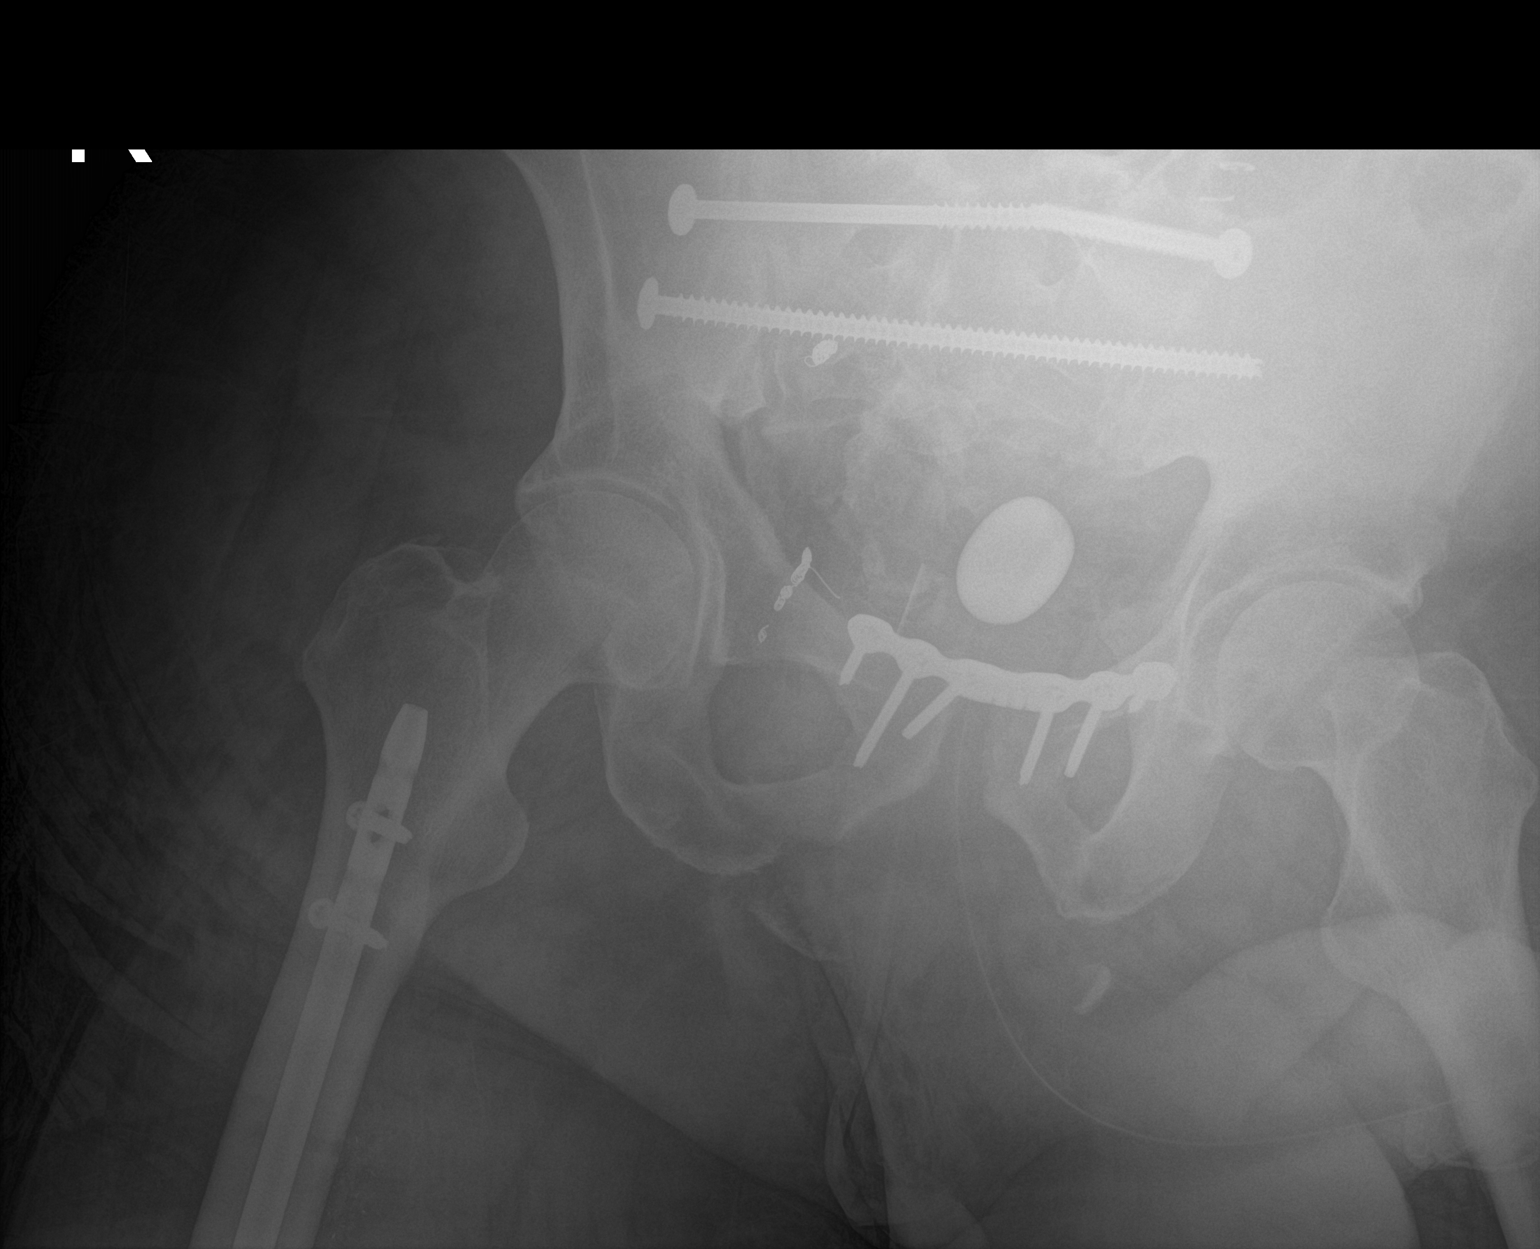

[pelvis ap (2 of 2)]
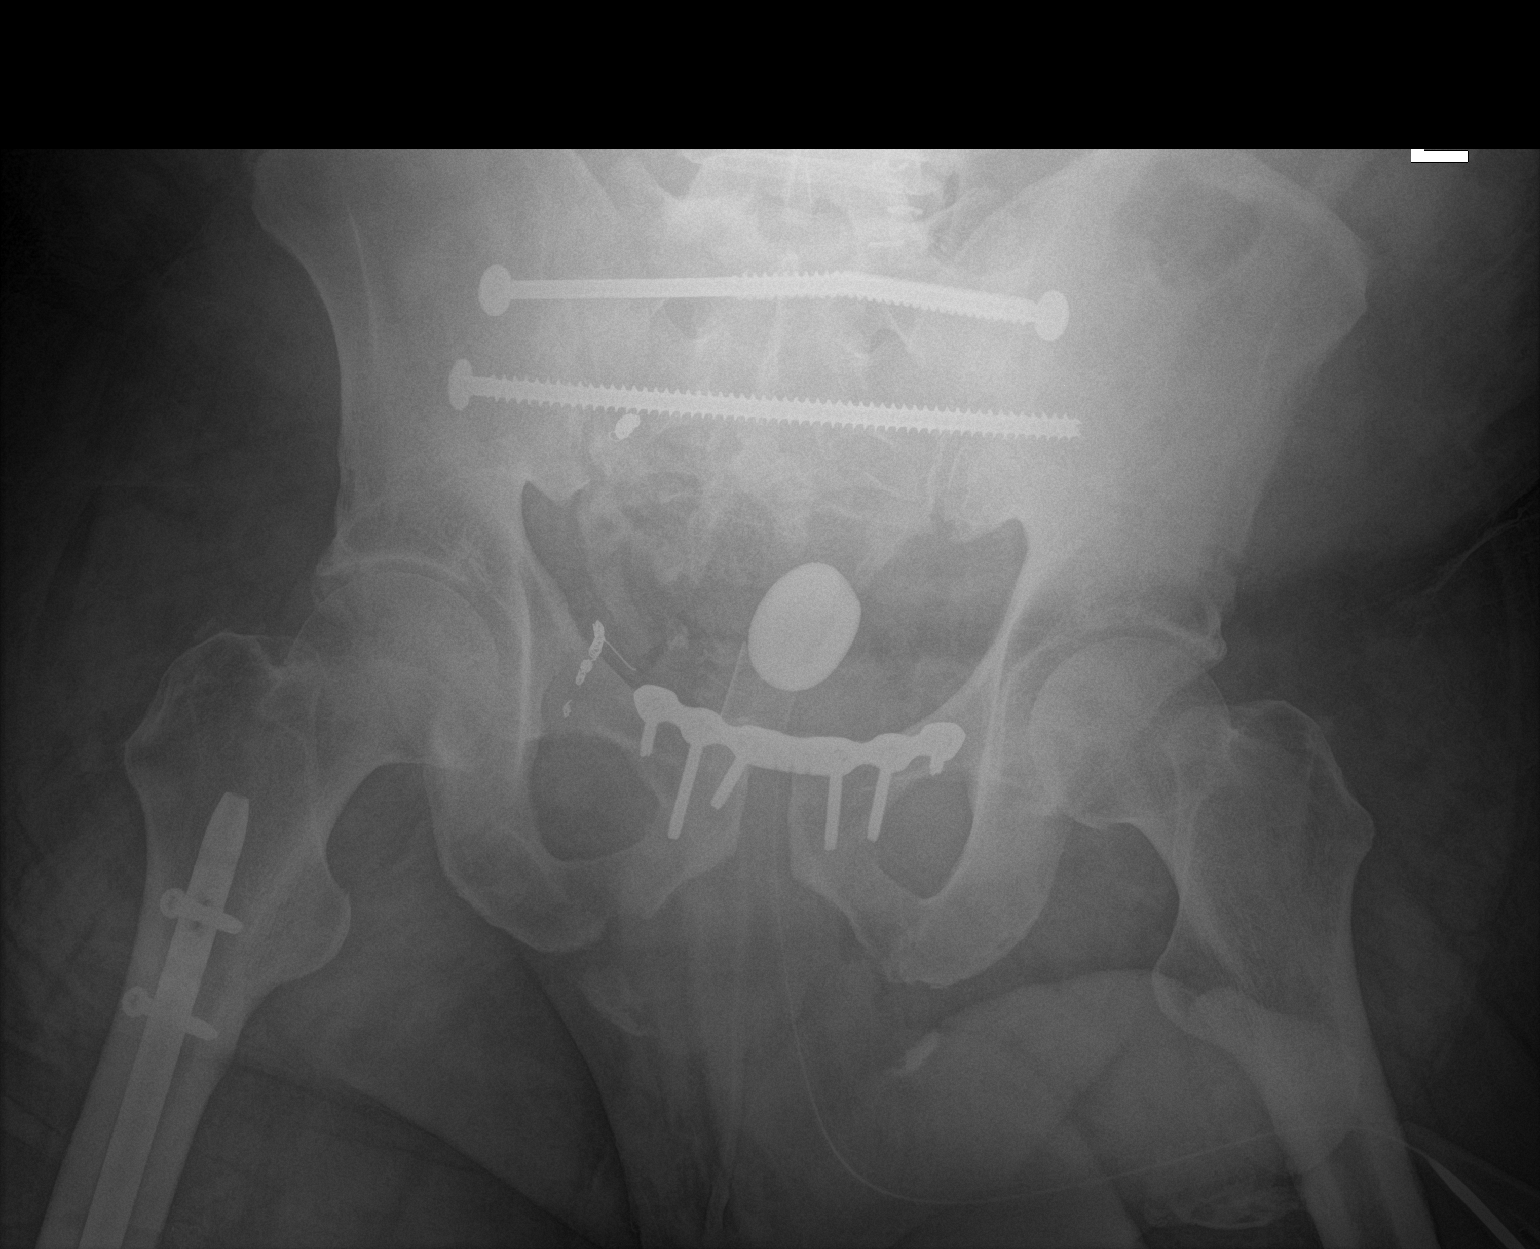

[2 of 2 positions shown; findings below may reference images not displayed]

FINDINGS: Postsurgical changes are again seen traversing the sacroiliac joints
as well as the pubic symphysis. Changes of prior embolus therapy are
again noted. Foley catheter is seen in place. Medullary rod in the
right femur is noted. No acute fracture is seen.
IMPRESSION: Postsurgical changes as described stable from the recent exam. No
acute abnormality noted.
